# Patient Record
Sex: Female | Born: 1975
Health system: Southern US, Community
[De-identification: ages and names within clinical notes are randomized; demographics above are authoritative.]

## PROBLEM LIST (undated history)

## (undated) DIAGNOSIS — E785 Hyperlipidemia, unspecified: Secondary | ICD-10-CM

## (undated) DIAGNOSIS — M810 Age-related osteoporosis without current pathological fracture: Secondary | ICD-10-CM

## (undated) DIAGNOSIS — T7840XA Allergy, unspecified, initial encounter: Secondary | ICD-10-CM

## (undated) DIAGNOSIS — M3320 Polymyositis, organ involvement unspecified: Secondary | ICD-10-CM

## (undated) DIAGNOSIS — I10 Essential (primary) hypertension: Secondary | ICD-10-CM

## (undated) DIAGNOSIS — G2581 Restless legs syndrome: Secondary | ICD-10-CM

## (undated) DIAGNOSIS — E559 Vitamin D deficiency, unspecified: Secondary | ICD-10-CM

## (undated) DIAGNOSIS — F419 Anxiety disorder, unspecified: Secondary | ICD-10-CM

## (undated) HISTORY — DX: Polymyositis, organ involvement unspecified: M33.20

## (undated) HISTORY — DX: Anxiety disorder, unspecified: F41.9

## (undated) HISTORY — DX: Hyperlipidemia, unspecified: E78.5

## (undated) HISTORY — DX: Essential (primary) hypertension: I10

## (undated) HISTORY — DX: Allergy, unspecified, initial encounter: T78.40XA

## (undated) HISTORY — DX: Restless legs syndrome: G25.81

## (undated) HISTORY — PX: BRAIN SURGERY: SHX531

## (undated) HISTORY — DX: Age-related osteoporosis without current pathological fracture: M81.0

## (undated) HISTORY — DX: Vitamin D deficiency, unspecified: E55.9

---

## 1998-02-19 ENCOUNTER — Emergency Department (HOSPITAL_COMMUNITY): Admission: EM | Admit: 1998-02-19 | Discharge: 1998-02-19 | Payer: Self-pay | Admitting: *Deleted

## 1999-11-14 ENCOUNTER — Encounter: Payer: Self-pay | Admitting: Emergency Medicine

## 1999-11-14 ENCOUNTER — Emergency Department (HOSPITAL_COMMUNITY): Admission: EM | Admit: 1999-11-14 | Discharge: 1999-11-14 | Payer: Self-pay | Admitting: Emergency Medicine

## 1999-11-22 ENCOUNTER — Emergency Department (HOSPITAL_COMMUNITY): Admission: EM | Admit: 1999-11-22 | Discharge: 1999-11-22 | Payer: Self-pay | Admitting: Emergency Medicine

## 1999-12-01 ENCOUNTER — Emergency Department (HOSPITAL_COMMUNITY): Admission: EM | Admit: 1999-12-01 | Discharge: 1999-12-01 | Payer: Self-pay | Admitting: Emergency Medicine

## 1999-12-11 ENCOUNTER — Emergency Department (HOSPITAL_COMMUNITY): Admission: EM | Admit: 1999-12-11 | Discharge: 1999-12-12 | Payer: Self-pay | Admitting: *Deleted

## 1999-12-12 ENCOUNTER — Encounter: Payer: Self-pay | Admitting: Emergency Medicine

## 2005-04-10 ENCOUNTER — Emergency Department (HOSPITAL_COMMUNITY): Admission: EM | Admit: 2005-04-10 | Discharge: 2005-04-11 | Payer: Self-pay | Admitting: Emergency Medicine

## 2005-11-01 ENCOUNTER — Emergency Department (HOSPITAL_COMMUNITY): Admission: EM | Admit: 2005-11-01 | Discharge: 2005-11-01 | Payer: Self-pay | Admitting: Emergency Medicine

## 2006-02-07 ENCOUNTER — Emergency Department (HOSPITAL_COMMUNITY): Admission: EM | Admit: 2006-02-07 | Discharge: 2006-02-07 | Payer: Self-pay | Admitting: Emergency Medicine

## 2006-10-17 ENCOUNTER — Emergency Department (HOSPITAL_COMMUNITY): Admission: EM | Admit: 2006-10-17 | Discharge: 2006-10-17 | Payer: Self-pay | Admitting: Emergency Medicine

## 2007-08-29 DIAGNOSIS — M3320 Polymyositis, organ involvement unspecified: Secondary | ICD-10-CM

## 2007-08-29 HISTORY — DX: Polymyositis, organ involvement unspecified: M33.20

## 2007-10-27 ENCOUNTER — Emergency Department (HOSPITAL_COMMUNITY): Admission: EM | Admit: 2007-10-27 | Discharge: 2007-10-27 | Payer: Self-pay | Admitting: Emergency Medicine

## 2007-11-11 ENCOUNTER — Ambulatory Visit: Payer: Self-pay | Admitting: Nurse Practitioner

## 2007-11-12 ENCOUNTER — Ambulatory Visit (HOSPITAL_COMMUNITY): Admission: RE | Admit: 2007-11-12 | Discharge: 2007-11-12 | Payer: Self-pay | Admitting: Family Medicine

## 2007-11-13 ENCOUNTER — Telehealth (INDEPENDENT_AMBULATORY_CARE_PROVIDER_SITE_OTHER): Payer: Self-pay | Admitting: Nurse Practitioner

## 2007-11-18 ENCOUNTER — Ambulatory Visit: Payer: Self-pay | Admitting: Nurse Practitioner

## 2007-11-18 ENCOUNTER — Emergency Department (HOSPITAL_COMMUNITY): Admission: EM | Admit: 2007-11-18 | Discharge: 2007-11-18 | Payer: Self-pay | Admitting: Emergency Medicine

## 2007-11-18 DIAGNOSIS — R531 Weakness: Secondary | ICD-10-CM

## 2007-11-20 ENCOUNTER — Inpatient Hospital Stay (HOSPITAL_COMMUNITY): Admission: RE | Admit: 2007-11-20 | Discharge: 2007-11-26 | Payer: Self-pay | Admitting: Neurosurgery

## 2007-11-20 ENCOUNTER — Encounter (INDEPENDENT_AMBULATORY_CARE_PROVIDER_SITE_OTHER): Payer: Self-pay | Admitting: Nurse Practitioner

## 2007-11-20 DIAGNOSIS — G935 Compression of brain: Secondary | ICD-10-CM

## 2007-12-10 ENCOUNTER — Encounter: Admission: RE | Admit: 2007-12-10 | Discharge: 2007-12-10 | Payer: Self-pay | Admitting: Neurosurgery

## 2007-12-26 ENCOUNTER — Encounter: Admission: RE | Admit: 2007-12-26 | Discharge: 2008-01-07 | Payer: Self-pay | Admitting: Neurosurgery

## 2008-01-02 ENCOUNTER — Encounter (INDEPENDENT_AMBULATORY_CARE_PROVIDER_SITE_OTHER): Payer: Self-pay | Admitting: Nurse Practitioner

## 2008-01-02 ENCOUNTER — Inpatient Hospital Stay (HOSPITAL_COMMUNITY): Admission: AD | Admit: 2008-01-02 | Discharge: 2008-01-08 | Payer: Self-pay | Admitting: Neurosurgery

## 2008-01-03 ENCOUNTER — Encounter (INDEPENDENT_AMBULATORY_CARE_PROVIDER_SITE_OTHER): Payer: Self-pay | Admitting: Neurosurgery

## 2008-01-06 ENCOUNTER — Ambulatory Visit: Payer: Self-pay | Admitting: Physical Medicine & Rehabilitation

## 2008-01-08 ENCOUNTER — Inpatient Hospital Stay (HOSPITAL_COMMUNITY)
Admission: RE | Admit: 2008-01-08 | Discharge: 2008-02-05 | Payer: Self-pay | Admitting: Physical Medicine & Rehabilitation

## 2008-01-09 LAB — CONVERTED CEMR LAB
ALT: 119 units/L
AST: 191 units/L
Albumin: 2.8 g/dL
Alkaline Phosphatase: 47 units/L
BUN: 10 mg/dL
Basophils Absolute: 0.1 10*3/uL
Basophils Relative: 1 %
CO2: 23 meq/L
Calcium: 9.1 mg/dL
Chloride: 106 meq/L
Creatinine, Ser: 0.31 mg/dL
Eosinophils Absolute: 0 10*3/uL
Eosinophils Relative: 0 %
Glucose, Bld: 80 mg/dL
HCT: 38.4 %
Hemoglobin: 13.2 g/dL
Hgb A1c MFr Bld: 5.3 %
Lymphocytes Relative: 28 %
Lymphs Abs: 3.4 10*3/uL
MCHC: 34.4 g/dL
MCV: 93.3 fL
Monocytes Absolute: 0.8 10*3/uL
Monocytes Relative: 7 %
Neutro Abs: 8 10*3/uL
Neutrophils Relative %: 65 %
Platelets: 444 10*3/uL
Potassium: 3.8 meq/L
RBC: 4.11 M/uL
RDW: 16.4 %
Sodium: 141 meq/L
Total Bilirubin: 0.4 mg/dL
Total Protein: 6.1 g/dL
WBC: 12.3 10*3/uL

## 2008-01-14 ENCOUNTER — Encounter (INDEPENDENT_AMBULATORY_CARE_PROVIDER_SITE_OTHER): Payer: Self-pay | Admitting: Nurse Practitioner

## 2008-02-05 ENCOUNTER — Ambulatory Visit: Payer: Self-pay | Admitting: Physical Medicine & Rehabilitation

## 2008-02-05 ENCOUNTER — Telehealth (INDEPENDENT_AMBULATORY_CARE_PROVIDER_SITE_OTHER): Payer: Self-pay | Admitting: Nurse Practitioner

## 2008-02-06 ENCOUNTER — Encounter (INDEPENDENT_AMBULATORY_CARE_PROVIDER_SITE_OTHER): Payer: Self-pay | Admitting: Nurse Practitioner

## 2008-02-10 ENCOUNTER — Encounter (HOSPITAL_COMMUNITY): Admission: RE | Admit: 2008-02-10 | Discharge: 2008-05-10 | Payer: Self-pay | Admitting: Neurology

## 2008-02-19 ENCOUNTER — Ambulatory Visit: Payer: Self-pay | Admitting: Nurse Practitioner

## 2008-03-02 ENCOUNTER — Encounter
Admission: RE | Admit: 2008-03-02 | Discharge: 2008-04-28 | Payer: Self-pay | Admitting: Physical Medicine & Rehabilitation

## 2008-03-03 ENCOUNTER — Ambulatory Visit: Payer: Self-pay | Admitting: Physical Medicine & Rehabilitation

## 2008-04-07 ENCOUNTER — Inpatient Hospital Stay (HOSPITAL_COMMUNITY): Admission: AD | Admit: 2008-04-07 | Discharge: 2008-04-16 | Payer: Self-pay | Admitting: Neurology

## 2008-04-20 ENCOUNTER — Encounter (INDEPENDENT_AMBULATORY_CARE_PROVIDER_SITE_OTHER): Payer: Self-pay | Admitting: Nurse Practitioner

## 2008-04-28 ENCOUNTER — Ambulatory Visit: Payer: Self-pay | Admitting: Physical Medicine & Rehabilitation

## 2008-05-11 ENCOUNTER — Encounter (HOSPITAL_COMMUNITY): Admission: RE | Admit: 2008-05-11 | Discharge: 2008-08-09 | Payer: Self-pay | Admitting: Neurology

## 2008-05-28 ENCOUNTER — Encounter (INDEPENDENT_AMBULATORY_CARE_PROVIDER_SITE_OTHER): Payer: Self-pay | Admitting: Nurse Practitioner

## 2008-06-15 ENCOUNTER — Encounter (INDEPENDENT_AMBULATORY_CARE_PROVIDER_SITE_OTHER): Payer: Self-pay | Admitting: Nurse Practitioner

## 2008-06-16 ENCOUNTER — Ambulatory Visit: Payer: Self-pay | Admitting: Nurse Practitioner

## 2008-06-16 DIAGNOSIS — M3320 Polymyositis, organ involvement unspecified: Secondary | ICD-10-CM | POA: Insufficient documentation

## 2008-06-16 DIAGNOSIS — I10 Essential (primary) hypertension: Secondary | ICD-10-CM | POA: Insufficient documentation

## 2008-06-16 HISTORY — DX: Essential (primary) hypertension: I10

## 2008-06-18 DIAGNOSIS — N200 Calculus of kidney: Secondary | ICD-10-CM | POA: Insufficient documentation

## 2008-06-22 ENCOUNTER — Ambulatory Visit: Payer: Self-pay | Admitting: *Deleted

## 2008-07-28 ENCOUNTER — Encounter: Admission: RE | Admit: 2008-07-28 | Discharge: 2008-08-27 | Payer: Self-pay | Admitting: Neurology

## 2008-09-07 ENCOUNTER — Encounter (INDEPENDENT_AMBULATORY_CARE_PROVIDER_SITE_OTHER): Payer: Self-pay | Admitting: Nurse Practitioner

## 2008-09-09 ENCOUNTER — Encounter: Admission: RE | Admit: 2008-09-09 | Discharge: 2008-11-20 | Payer: Self-pay | Admitting: Neurology

## 2008-11-03 ENCOUNTER — Encounter (INDEPENDENT_AMBULATORY_CARE_PROVIDER_SITE_OTHER): Payer: Self-pay | Admitting: Nurse Practitioner

## 2008-12-03 ENCOUNTER — Encounter (INDEPENDENT_AMBULATORY_CARE_PROVIDER_SITE_OTHER): Payer: Self-pay | Admitting: Nurse Practitioner

## 2008-12-18 ENCOUNTER — Encounter (INDEPENDENT_AMBULATORY_CARE_PROVIDER_SITE_OTHER): Payer: Self-pay | Admitting: Nurse Practitioner

## 2008-12-24 ENCOUNTER — Encounter (INDEPENDENT_AMBULATORY_CARE_PROVIDER_SITE_OTHER): Payer: Self-pay | Admitting: Nurse Practitioner

## 2009-01-06 ENCOUNTER — Encounter (INDEPENDENT_AMBULATORY_CARE_PROVIDER_SITE_OTHER): Payer: Self-pay | Admitting: Nurse Practitioner

## 2009-01-12 LAB — CONVERTED CEMR LAB

## 2009-01-13 ENCOUNTER — Ambulatory Visit: Payer: Self-pay | Admitting: Nurse Practitioner

## 2009-01-13 LAB — CONVERTED CEMR LAB: Blood Glucose, Fingerstick: 64

## 2009-01-27 ENCOUNTER — Ambulatory Visit: Payer: Self-pay | Admitting: Nurse Practitioner

## 2009-01-28 ENCOUNTER — Encounter: Admission: RE | Admit: 2009-01-28 | Discharge: 2009-03-03 | Payer: Self-pay | Admitting: Clinical Neurophysiology

## 2009-01-28 ENCOUNTER — Encounter (INDEPENDENT_AMBULATORY_CARE_PROVIDER_SITE_OTHER): Payer: Self-pay | Admitting: Nurse Practitioner

## 2009-01-28 DIAGNOSIS — E7800 Pure hypercholesterolemia, unspecified: Secondary | ICD-10-CM | POA: Insufficient documentation

## 2009-01-28 LAB — CONVERTED CEMR LAB
Cholesterol: 268 mg/dL — ABNORMAL HIGH (ref 0–200)
HDL: 48 mg/dL (ref >39)
LDL Cholesterol: 185 mg/dL — ABNORMAL HIGH (ref 0–99)
Total CHOL/HDL Ratio: 5.6
Triglycerides: 175 mg/dL — ABNORMAL HIGH (ref <150)
VLDL: 35 mg/dL (ref 0–40)

## 2009-02-01 ENCOUNTER — Telehealth (INDEPENDENT_AMBULATORY_CARE_PROVIDER_SITE_OTHER): Payer: Self-pay | Admitting: Nurse Practitioner

## 2009-02-01 ENCOUNTER — Encounter
Admission: RE | Admit: 2009-02-01 | Discharge: 2009-02-11 | Payer: Self-pay | Admitting: Physical Medicine & Rehabilitation

## 2009-02-19 ENCOUNTER — Ambulatory Visit: Payer: Self-pay | Admitting: Nurse Practitioner

## 2009-02-22 ENCOUNTER — Encounter (INDEPENDENT_AMBULATORY_CARE_PROVIDER_SITE_OTHER): Payer: Self-pay | Admitting: Nurse Practitioner

## 2009-02-24 ENCOUNTER — Encounter (INDEPENDENT_AMBULATORY_CARE_PROVIDER_SITE_OTHER): Payer: Self-pay | Admitting: Nurse Practitioner

## 2009-02-25 ENCOUNTER — Telehealth (INDEPENDENT_AMBULATORY_CARE_PROVIDER_SITE_OTHER): Payer: Self-pay | Admitting: Nurse Practitioner

## 2009-03-04 ENCOUNTER — Ambulatory Visit: Payer: Self-pay | Admitting: Nurse Practitioner

## 2009-03-05 LAB — CONVERTED CEMR LAB
ALT: 45 units/L — ABNORMAL HIGH (ref 0–35)
AST: 31 units/L (ref 0–37)
Albumin: 4.2 g/dL (ref 3.5–5.2)
Alkaline Phosphatase: 63 units/L (ref 39–117)
BUN: 14 mg/dL (ref 6–23)
Basophils Absolute: 0 10*3/uL (ref 0.0–0.1)
Basophils Relative: 0 % (ref 0–1)
CO2: 21 meq/L (ref 19–32)
Calcium: 9.5 mg/dL (ref 8.4–10.5)
Chloride: 108 meq/L (ref 96–112)
Creatinine, Ser: 0.65 mg/dL (ref 0.40–1.20)
Eosinophils Absolute: 0 10*3/uL (ref 0.0–0.7)
Eosinophils Relative: 0 % (ref 0–5)
Glucose, Bld: 86 mg/dL (ref 70–99)
HCT: 42.3 % (ref 36.0–46.0)
Hemoglobin: 14.3 g/dL (ref 12.0–15.0)
Lymphocytes Relative: 13 % (ref 12–46)
Lymphs Abs: 2 10*3/uL (ref 0.7–4.0)
MCHC: 33.8 g/dL (ref 30.0–36.0)
MCV: 98.4 fL (ref 78.0–100.0)
Monocytes Absolute: 0.7 10*3/uL (ref 0.1–1.0)
Monocytes Relative: 5 % (ref 3–12)
Neutro Abs: 12.7 10*3/uL — ABNORMAL HIGH (ref 1.7–7.7)
Neutrophils Relative %: 82 % — ABNORMAL HIGH (ref 43–77)
Platelets: 356 10*3/uL (ref 150–400)
Potassium: 3.9 meq/L (ref 3.5–5.3)
RBC: 4.3 M/uL (ref 3.87–5.11)
RDW: 17.4 % — ABNORMAL HIGH (ref 11.5–15.5)
Sodium: 143 meq/L (ref 135–145)
Total Bilirubin: 0.2 mg/dL — ABNORMAL LOW (ref 0.3–1.2)
Total CK: 758 units/L — ABNORMAL HIGH (ref 7–177)
Total Protein: 6.6 g/dL (ref 6.0–8.3)
WBC: 15.5 10*3/uL — ABNORMAL HIGH (ref 4.0–10.5)

## 2009-03-30 ENCOUNTER — Encounter (INDEPENDENT_AMBULATORY_CARE_PROVIDER_SITE_OTHER): Payer: Self-pay | Admitting: Nurse Practitioner

## 2009-04-14 ENCOUNTER — Ambulatory Visit: Payer: Self-pay | Admitting: Nurse Practitioner

## 2009-06-03 ENCOUNTER — Ambulatory Visit: Payer: Self-pay | Admitting: Nurse Practitioner

## 2009-06-17 ENCOUNTER — Ambulatory Visit: Payer: Self-pay | Admitting: Nurse Practitioner

## 2009-06-18 LAB — CONVERTED CEMR LAB
ALT: 57 units/L — ABNORMAL HIGH (ref 0–35)
AST: 50 units/L — ABNORMAL HIGH (ref 0–37)
Albumin: 3.8 g/dL (ref 3.5–5.2)
Alkaline Phosphatase: 77 units/L (ref 39–117)
BUN: 7 mg/dL (ref 6–23)
Basophils Absolute: 0 10*3/uL (ref 0.0–0.1)
Basophils Relative: 0 % (ref 0–1)
CO2: 21 meq/L (ref 19–32)
Calcium: 9.1 mg/dL (ref 8.4–10.5)
Chloride: 109 meq/L (ref 96–112)
Creatinine, Ser: 0.48 mg/dL (ref 0.40–1.20)
Eosinophils Absolute: 0.1 10*3/uL (ref 0.0–0.7)
Eosinophils Relative: 1 % (ref 0–5)
Glucose, Bld: 85 mg/dL (ref 70–99)
HCT: 38.4 % (ref 36.0–46.0)
Hemoglobin: 13 g/dL (ref 12.0–15.0)
Lymphocytes Relative: 26 % (ref 12–46)
Lymphs Abs: 2.3 10*3/uL (ref 0.7–4.0)
MCHC: 33.9 g/dL (ref 30.0–36.0)
MCV: 97.2 fL (ref 78.0–100.0)
Monocytes Absolute: 0.3 10*3/uL (ref 0.1–1.0)
Monocytes Relative: 4 % (ref 3–12)
Neutro Abs: 6.3 10*3/uL (ref 1.7–7.7)
Neutrophils Relative %: 70 % (ref 43–77)
Platelets: 431 10*3/uL — ABNORMAL HIGH (ref 150–400)
Potassium: 4.1 meq/L (ref 3.5–5.3)
RBC: 3.95 M/uL (ref 3.87–5.11)
RDW: 15 % (ref 11.5–15.5)
Sodium: 142 meq/L (ref 135–145)
Total Bilirubin: 0.3 mg/dL (ref 0.3–1.2)
Total CK: 281 units/L — ABNORMAL HIGH (ref 7–177)
Total Protein: 7.1 g/dL (ref 6.0–8.3)
WBC: 9.1 10*3/uL (ref 4.0–10.5)

## 2009-07-29 ENCOUNTER — Ambulatory Visit: Payer: Self-pay | Admitting: Nurse Practitioner

## 2009-07-30 LAB — CONVERTED CEMR LAB
ALT: 46 units/L — ABNORMAL HIGH (ref 0–35)
Basophils Relative: 1 % (ref 0–1)
CO2: 18 meq/L — ABNORMAL LOW (ref 19–32)
Chloride: 106 meq/L (ref 96–112)
Lymphs Abs: 1.2 10*3/uL (ref 0.7–4.0)
Monocytes Relative: 8 % (ref 3–12)
Neutro Abs: 2.9 10*3/uL (ref 1.7–7.7)
Neutrophils Relative %: 65 % (ref 43–77)
RBC: 4.03 M/uL (ref 3.87–5.11)
Sodium: 139 meq/L (ref 135–145)
Total Bilirubin: 0.2 mg/dL — ABNORMAL LOW (ref 0.3–1.2)
Total CK: 672 units/L — ABNORMAL HIGH (ref 7–177)
Total Protein: 8.9 g/dL — ABNORMAL HIGH (ref 6.0–8.3)
WBC: 4.4 10*3/uL (ref 4.0–10.5)

## 2009-08-26 ENCOUNTER — Encounter (INDEPENDENT_AMBULATORY_CARE_PROVIDER_SITE_OTHER): Payer: Self-pay | Admitting: Nurse Practitioner

## 2009-09-29 ENCOUNTER — Telehealth (INDEPENDENT_AMBULATORY_CARE_PROVIDER_SITE_OTHER): Payer: Self-pay | Admitting: Nurse Practitioner

## 2009-10-10 ENCOUNTER — Telehealth (INDEPENDENT_AMBULATORY_CARE_PROVIDER_SITE_OTHER): Payer: Self-pay | Admitting: Nurse Practitioner

## 2009-11-01 ENCOUNTER — Encounter (INDEPENDENT_AMBULATORY_CARE_PROVIDER_SITE_OTHER): Payer: Self-pay | Admitting: Nurse Practitioner

## 2009-11-24 ENCOUNTER — Telehealth (INDEPENDENT_AMBULATORY_CARE_PROVIDER_SITE_OTHER): Payer: Self-pay | Admitting: Nurse Practitioner

## 2009-12-02 ENCOUNTER — Encounter (INDEPENDENT_AMBULATORY_CARE_PROVIDER_SITE_OTHER): Payer: Self-pay | Admitting: Nurse Practitioner

## 2009-12-03 ENCOUNTER — Encounter (INDEPENDENT_AMBULATORY_CARE_PROVIDER_SITE_OTHER): Payer: Self-pay | Admitting: Nurse Practitioner

## 2009-12-09 ENCOUNTER — Encounter: Admission: RE | Admit: 2009-12-09 | Discharge: 2010-02-02 | Payer: Self-pay | Admitting: Clinical Neurophysiology

## 2009-12-31 ENCOUNTER — Encounter (INDEPENDENT_AMBULATORY_CARE_PROVIDER_SITE_OTHER): Payer: Self-pay | Admitting: Nurse Practitioner

## 2010-04-14 ENCOUNTER — Encounter (INDEPENDENT_AMBULATORY_CARE_PROVIDER_SITE_OTHER): Payer: Self-pay | Admitting: Nurse Practitioner

## 2010-04-25 ENCOUNTER — Encounter (INDEPENDENT_AMBULATORY_CARE_PROVIDER_SITE_OTHER): Payer: Self-pay | Admitting: Nurse Practitioner

## 2010-04-27 ENCOUNTER — Encounter (INDEPENDENT_AMBULATORY_CARE_PROVIDER_SITE_OTHER): Payer: Self-pay | Admitting: Nurse Practitioner

## 2010-05-19 ENCOUNTER — Encounter (INDEPENDENT_AMBULATORY_CARE_PROVIDER_SITE_OTHER): Payer: Self-pay | Admitting: Nurse Practitioner

## 2010-05-19 ENCOUNTER — Ambulatory Visit: Payer: Self-pay | Admitting: Internal Medicine

## 2010-05-24 ENCOUNTER — Telehealth (INDEPENDENT_AMBULATORY_CARE_PROVIDER_SITE_OTHER): Payer: Self-pay | Admitting: Nurse Practitioner

## 2010-05-24 LAB — CONVERTED CEMR LAB
ALT: 24 units/L (ref 0–35)
BUN: 9 mg/dL (ref 6–23)
CO2: 24 meq/L (ref 19–32)
Cholesterol: 220 mg/dL — ABNORMAL HIGH (ref 0–200)
Creatinine, Ser: 0.51 mg/dL (ref 0.40–1.20)
Glucose, Bld: 76 mg/dL (ref 70–99)
HDL: 43 mg/dL (ref >39)
Total Bilirubin: 0.2 mg/dL — ABNORMAL LOW (ref 0.3–1.2)
Total CHOL/HDL Ratio: 5.1
Triglycerides: 121 mg/dL (ref <150)
VLDL: 24 mg/dL (ref 0–40)

## 2010-05-25 ENCOUNTER — Telehealth (INDEPENDENT_AMBULATORY_CARE_PROVIDER_SITE_OTHER): Payer: Self-pay | Admitting: Nurse Practitioner

## 2010-06-13 ENCOUNTER — Telehealth (INDEPENDENT_AMBULATORY_CARE_PROVIDER_SITE_OTHER): Payer: Self-pay | Admitting: Nurse Practitioner

## 2010-06-27 ENCOUNTER — Encounter (INDEPENDENT_AMBULATORY_CARE_PROVIDER_SITE_OTHER): Payer: Self-pay | Admitting: Nurse Practitioner

## 2010-06-27 LAB — CONVERTED CEMR LAB
ALT: 22 units/L
AST: 36 units/L
Alkaline Phosphatase: 83 units/L
BUN: 9 mg/dL
Creatinine, Ser: 0.6 mg/dL
Glucose, Bld: 87 mg/dL

## 2010-06-29 ENCOUNTER — Encounter (INDEPENDENT_AMBULATORY_CARE_PROVIDER_SITE_OTHER): Payer: Self-pay | Admitting: Nurse Practitioner

## 2010-07-15 ENCOUNTER — Telehealth (INDEPENDENT_AMBULATORY_CARE_PROVIDER_SITE_OTHER): Payer: Self-pay | Admitting: Nurse Practitioner

## 2010-07-25 ENCOUNTER — Telehealth (INDEPENDENT_AMBULATORY_CARE_PROVIDER_SITE_OTHER): Payer: Self-pay | Admitting: Nurse Practitioner

## 2010-09-01 ENCOUNTER — Encounter (INDEPENDENT_AMBULATORY_CARE_PROVIDER_SITE_OTHER): Payer: Self-pay | Admitting: Nurse Practitioner

## 2010-09-01 LAB — CONVERTED CEMR LAB
AST: 47 units/L
Albumin: 3.3 g/dL
Alkaline Phosphatase: 115 units/L
BUN: 8 mg/dL
Creatinine, Ser: 0.62 mg/dL
Glucose, Bld: 70 mg/dL
Potassium: 3.9 meq/L
Total Bilirubin: 0.3 mg/dL

## 2010-09-04 ENCOUNTER — Emergency Department (HOSPITAL_COMMUNITY)
Admission: EM | Admit: 2010-09-04 | Discharge: 2010-09-04 | Payer: Self-pay | Source: Home / Self Care | Admitting: Emergency Medicine

## 2010-09-09 ENCOUNTER — Encounter (INDEPENDENT_AMBULATORY_CARE_PROVIDER_SITE_OTHER): Payer: Self-pay | Admitting: Nurse Practitioner

## 2010-09-12 LAB — URINALYSIS, ROUTINE W REFLEX MICROSCOPIC
Bilirubin Urine: NEGATIVE
Ketones, ur: NEGATIVE mg/dL
Nitrite: NEGATIVE
Protein, ur: NEGATIVE mg/dL
Specific Gravity, Urine: 1.029 (ref 1.005–1.030)
Urine Glucose, Fasting: NEGATIVE mg/dL
Urobilinogen, UA: 0.2 mg/dL (ref 0.0–1.0)
pH: 6.5 (ref 5.0–8.0)

## 2010-09-12 LAB — URINE MICROSCOPIC-ADD ON

## 2010-09-12 LAB — PREGNANCY, URINE: Preg Test, Ur: NEGATIVE

## 2010-09-13 ENCOUNTER — Encounter (INDEPENDENT_AMBULATORY_CARE_PROVIDER_SITE_OTHER): Payer: Self-pay | Admitting: Nurse Practitioner

## 2010-09-15 ENCOUNTER — Ambulatory Visit: Admit: 2010-09-15 | Payer: Self-pay | Admitting: Nurse Practitioner

## 2010-09-18 ENCOUNTER — Encounter: Payer: Self-pay | Admitting: Family Medicine

## 2010-09-25 LAB — CONVERTED CEMR LAB
ALT: 196 units/L — ABNORMAL HIGH (ref 0–35)
ALT: 41 units/L
AST: 343 units/L — ABNORMAL HIGH (ref 0–37)
Albumin: 4.2 g/dL (ref 3.5–5.2)
Alkaline Phosphatase: 51 units/L (ref 39–117)
BUN: 19 mg/dL (ref 6–23)
BUN: 9 mg/dL
Basophils Absolute: 0 10*3/uL (ref 0.0–0.1)
Basophils Relative: 0 % (ref 0–1)
CO2: 21 meq/L (ref 19–32)
CO2: 24 meq/L
Calcium: 9.6 mg/dL (ref 8.4–10.5)
Chloride: 105 meq/L (ref 96–112)
Creatinine, Ser: 0.45 mg/dL (ref 0.40–1.20)
Creatinine, Ser: 0.5 mg/dL
Eosinophils Absolute: 0.1 10*3/uL (ref 0.0–0.7)
Eosinophils Relative: 1 % (ref 0–5)
Glucose, Bld: 63 mg/dL — ABNORMAL LOW (ref 70–99)
HCT: 47.1 % — ABNORMAL HIGH (ref 36.0–46.0)
Hemoglobin: 15.6 g/dL — ABNORMAL HIGH (ref 12.0–15.0)
Lymphocytes Relative: 33 % (ref 12–46)
Lymphs Abs: 3.2 10*3/uL (ref 0.7–4.0)
MCHC: 33.1 g/dL (ref 30.0–36.0)
MCV: 92.2 fL (ref 78.0–100.0)
Monocytes Absolute: 0.8 10*3/uL (ref 0.1–1.0)
Monocytes Relative: 8 % (ref 3–12)
Neutro Abs: 5.6 10*3/uL (ref 1.7–7.7)
Neutrophils Relative %: 58 % (ref 43–77)
Platelets: 509 10*3/uL — ABNORMAL HIGH (ref 150–400)
Potassium: 5.5 meq/L — ABNORMAL HIGH (ref 3.5–5.3)
RBC: 5.11 M/uL (ref 3.87–5.11)
RDW: 16 % — ABNORMAL HIGH (ref 11.5–15.5)
Sodium: 142 meq/L (ref 135–145)
TSH: 3.439 microintl units/mL (ref 0.350–5.50)
Total Bilirubin: 0.4 mg/dL (ref 0.3–1.2)
Total Bilirubin: 0.5 mg/dL
Total Protein: 8.1 g/dL (ref 6.0–8.3)
WBC: 9.7 10*3/uL (ref 4.0–10.5)

## 2010-09-27 NOTE — Letter (Signed)
Summary: [facility]  [facility]   Imported By: Arta Bruce 12/27/2009 16:53:51  _____________________________________________________________________  External Attachment:    Type:   Image     Comment:   External Document

## 2010-09-27 NOTE — Miscellaneous (Signed)
Summary: Lab results  Clinical Lists Changes  Observations: Added new observation of SGPT (ALT): 41 units/L (04/25/2010 14:48) Added new observation of SGOT (AST): 39 units/L (04/25/2010 14:48) Added new observation of ALK PHOS: 95 units/L (04/25/2010 14:48) Added new observation of BILI TOTAL: 0.5 mg/dL (98/06/9146 82:95) Added new observation of CREATININE: 0.5 mg/dL (62/13/0865 78:46) Added new observation of BUN: 9 mg/dL (96/29/5284 13:24) Added new observation of BG RANDOM: 82 mg/dL (40/05/2724 36:64) Added new observation of CO2 PLSM/SER: 24 meq/L (04/25/2010 14:48) Added new observation of CL SERUM: 109 meq/L (04/25/2010 14:48) Added new observation of K SERUM: 3.9 meq/L (04/25/2010 14:48) Added new observation of NA: 139 meq/L (04/25/2010 14:48)

## 2010-09-27 NOTE — Letter (Signed)
Summary: MAILED REQUESTED RECORDS TO [facility]  MAILED REQUESTED RECORDS TO [facility]   Imported By: Arta Bruce 12/31/2009 10:34:39  _____________________________________________________________________  External Attachment:    Type:   Image     Comment:   External Document

## 2010-09-27 NOTE — Progress Notes (Signed)
Summary: Needs lab visit  Phone Note Outgoing Call   Summary of Call: call pt looks like she had labs done 08/26/2009 at [facility] She should have come to this office on 09/26/2009 for labs (unless she went backe to [facility]) If not she will need CK, CMP, CBC lab visit this week with results faxed to Dr. Janan Ridge [phone] so that he will nkow how to titrate her medications accordingly Initial call taken by: Lehman Prom FNP,  October 10, 2009 11:07 AM  Follow-up for Phone Call        pt states her nurse that comes by to give IV treatment has been drawing her bloodwork and sending it to [facility].  noted and reviewed with Steward Drone. Jesse Fall, fnp Follow-up by: Levon Hedger,  October 11, 2009 9:52 AM

## 2010-09-27 NOTE — Letter (Signed)
Summary: External Correspondence//[facility]  External Correspondence//[facility]   Imported By: Arta Bruce 12/14/2009 10:18:20  _____________________________________________________________________  External Attachment:    Type:   Image     Comment:   External Document

## 2010-09-27 NOTE — Progress Notes (Signed)
Summary: NEED TO SPEAK WITH A NURSE re nasal D/C  Phone Note Call from Patient Call back at [phone]   Summary of Call: NEED TO SPEAK WITH NURSE. NEED TO KNOW WHAT MEDICINE SHE NEEDS TO TAKE  BECAUSE SHE HAS A COLD AND HAVE  BLOOD PRESSURE PROBLEM Initial call taken by: Domenic Polite,  July 15, 2010 12:02 PM  Follow-up for Phone Call        afebrile, nasal D/C lg amt clear. dry cough, HA frontal area, eyes watering, ears stopped up, denies sore throat. Advised to drink plenty of liquids, use netti pot or nasal saline spray. Shower directing water on sinus areas. Take Tylenol for HA may take Robitussin for cough. To call us if develops fever, D/C becomes greenish yellow or feels like she is getting worse.  Follow-up by: Gaylyn Cheers RN,  July 15, 2010 4:00 PM

## 2010-09-27 NOTE — Miscellaneous (Signed)
Summary: Med update  Clinical Lists Changes  Medications: Changed medication from PREDNISONE 20 MG TABS (PREDNISONE) One tablet by mouth daily to PREDNISONE 20 MG TABS (PREDNISONE) 1/2 tablet by mouth daily

## 2010-09-27 NOTE — Letter (Signed)
Summary: COMPREHENSIVE METABO;IC PANEL  COMPREHENSIVE METABO;IC PANEL   Imported By: Arta Bruce 06/30/2010 15:13:32  _____________________________________________________________________  External Attachment:    Type:   Image     Comment:   External Document

## 2010-09-27 NOTE — Progress Notes (Signed)
Summary: Allergies/Requesting Medication  Phone Note Call from Patient Call back at [facility] Phone [phone]   Summary of Call: [facility] pt. [name] says that she is having trouble with her allergies and wants to know if you can call something into [facility] on [address] that her Medicaid will cover. Initial call taken by: Leodis Rains,  November 24, 2009 3:39 PM  Follow-up for Phone Call        forward to N. Daphine Deutscher, fnp  Additional Follow-up for Phone Call Additional follow up Details #1::        loratadine 10mg  by mouth daily ordered and sent electronically to [facility] notify pt Additional Follow-up by: Lehman Prom FNP,  November 24, 2009 4:31 PM    Additional Follow-up for Phone Call Additional follow up Details #2::    pt informed. Follow-up by: Levon Hedger,  November 24, 2009 4:55 PM  New/Updated Medications: LORATADINE 10 MG TABS (LORATADINE) One tablet by mouth daily for allergies Prescriptions: LORATADINE 10 MG TABS (LORATADINE) One tablet by mouth daily for allergies  #30 x 3   Entered and Authorized by:   Lehman Prom FNP   Signed by:   Lehman Prom FNP on 11/24/2009   Method used:   Electronically to        [facility] [phone]* (retail)       [address]       [place], [place]  [address]       Ph: [phone]       Fax: [phone]   RxID:   [id number]

## 2010-09-27 NOTE — Progress Notes (Signed)
Summary: Need Dx codes  Phone Note From Pharmacy   Caller: [facility] Summary of Call: pt insurancee has changed. now has medicare A and B Medicare B will not cover methotrexate sodium/ IVIG please give add"l ICD9 codes fax [phone] attn:pharmacy Preferably 279.06 or 279.2 pharmacy has been using 728 for sometime now and Medicare will not cover this Initial call taken by: Michelle Nasuti,  May 24, 2010 4:24 PM  Follow-up for Phone Call        spoke with the pharmacy.  will update problem list Follow-up by: Lehman Prom FNP,  May 25, 2010 2:59 PM  New Problems: COMMON VARIABLE IMMUNODEFICIENCY (ICD-279.06)   New Problems: COMMON VARIABLE IMMUNODEFICIENCY (ICD-279.06)

## 2010-09-27 NOTE — Progress Notes (Signed)
Summary: NEED TO TALK TO THE NURSE  Phone Note Call from Patient Call back at [phone]   Caller: Patient Summary of Call: PLEASE CALL PATIENT SHE NEED TO TALK TO YOU PERSONAL CALL. Initial call taken by: Domenic Polite,  June 13, 2010 11:59 AM  Follow-up for Phone Call        [name]  June 17, 2010 10:46 AM Left message on machine for pt to return call to the office.  Additional Follow-up for Phone Call Additional follow up Details #1::        Pt. wanted to have her port-a-cath changed to a Hickman. She had already contacted her neurologist who had ordered the port-a-cath. Advised she will need to discuss with him. Additional Follow-up by: Gaylyn Cheers RN,  June 22, 2010 12:46 PM

## 2010-09-27 NOTE — Progress Notes (Signed)
Summary: REFILL ON NEXIUM  Phone Note Call from Patient Call back at Home Phone [phone]   Reason for Call: Refill Medication Summary of Call: [name]  [name] IS CALLING IN FOR A REFILL ON HER NEXIUM. SHE SAYS AFTER THIS MONTH SHE WONT HAVE ANOTHER REFILL. SHE USES [facility] ON [address] Initial call taken by: Leodis Rains,  September 29, 2009 1:00 PM  Follow-up for Phone Call        forward to N. Martin,FnP Follow-up by: Levon Hedger,  September 30, 2009 10:29 AM  Additional Follow-up for Phone Call Additional follow up Details #1::        [facility] notify pt to check with pharmacy for availability Additional Follow-up by: Lehman Prom FNP,  September 30, 2009 10:57 AM    Additional Follow-up for Phone Call Additional follow up Details #2::    pt informed. Follow-up by: Levon Hedger,  September 30, 2009 12:58 PM  Prescriptions: NEXIUM 40 MG CPDR (ESOMEPRAZOLE MAGNESIUM) One tablet by mouth daily for stomach  #30 x 5   Entered and Authorized by:   Lehman Prom FNP   Signed by:   Lehman Prom FNP on 09/30/2009   Method used:   Electronically to        [facility] [phone]* (retail)       [address]       [place], [place]  [address]       Ph: [phone]       Fax: [phone]   RxID:   [id number]

## 2010-09-27 NOTE — Letter (Signed)
Summary: [facility]  [facility]   Imported By: Arta Bruce 04/18/2010 09:08:28  _____________________________________________________________________  External Attachment:    Type:   Image     Comment:   External Document

## 2010-09-27 NOTE — Progress Notes (Signed)
Summary: Needs different antibiotic  Phone Note Call from Patient   Summary of Call: Was given Keflex at her last appointment -- states it makes her stomach upset.  Would like something else called in to [facility] on [address]  Initial call taken by: Dutch Quint RN,  May 25, 2010 9:23 AM  Follow-up for Phone Call        is she eating before taking this medication? need to eat cracker, bread, etc  not dairy products or fatty junk foods with the medication. keflex is the best for skin infections so would like her to try to take as ordered Follow-up by: Lehman Prom FNP,  May 25, 2010 9:31 AM  Additional Follow-up for Phone Call Additional follow up Details #1::        Left message on voicemail for pt. to return call.  Dutch Quint RN  May 25, 2010 9:40 AM  Says she was eating before, but she will give it another try.  Dutch Quint RN  May 25, 2010 9:52 AM     Additional Follow-up for Phone Call Additional follow up Details #2::    Please given another try. another option would be to take oral suspension of the same medication but really don't want to change due to immune system n.martin,fnp  May 25, 2010 10:49 AM  Levon Hedger  May 30, 2010 9:42 AM spoke with pt she says that she is not taking it 3 times a day but she is taking it twice a day and tolerating it.

## 2010-09-27 NOTE — Progress Notes (Signed)
Summary: MEDS REFILL Nexium  Phone Note Refill Request Call back at [phone]   Refills Requested: Medication #1:  NEXIUM 40 MG CPDR One tablet by mouth daily for stomach PHARMACY [facility] [address]   Initial call taken by: Domenic Polite,  July 25, 2010 10:17 AM  Follow-up for Phone Call        Left message on answering machine for pt. to return call.  Still has refills available at pharmacy.  Dutch Quint RN  July 25, 2010 5:20 PM  Pt. notified -- got refill yesterday.  Dutch Quint RN  July 27, 2010 9:19 AM

## 2010-09-27 NOTE — Miscellaneous (Signed)
Summary: Labs  Clinical Lists Changes  Observations: Added new observation of CALCIUM: 8.3 mg/dL (16/05/9603 54:09) Added new observation of SGPT (ALT): 22 units/L (06/27/2010 18:16) Added new observation of SGOT (AST): 36 units/L (06/27/2010 18:16) Added new observation of ALK PHOS: 83 units/L (06/27/2010 18:16) Added new observation of CREATININE: 0.6 mg/dL (81/19/1478 29:56) Added new observation of BUN: 9 mg/dL (21/30/8657 84:69) Added new observation of BG RANDOM: 87 mg/dL (62/95/2841 32:44) Added new observation of CO2 PLSM/SER: 22 meq/L (06/27/2010 18:16) Added new observation of CL SERUM: 106 meq/L (06/27/2010 18:16) Added new observation of K SERUM: 3.4 meq/L (06/27/2010 18:16) Added new observation of NA: 132 meq/L (06/27/2010 18:16)

## 2010-09-27 NOTE — Letter (Signed)
[facility]   Imported By: Arta Bruce 12/06/2009 14:15:25  _____________________________________________________________________  External Attachment:    Type:   Image     Comment:   External Document

## 2010-09-27 NOTE — Assessment & Plan Note (Signed)
Summary: HTN/Hypercholesterolemia   Vital Signs:  Patient profile:   35 year old female LMP:     04/30/2010 Weight:      199.3 pounds BMI:     29.54 Temp:     97.2 degrees F oral Pulse rate:   70 / minute Pulse rhythm:   regular Resp:     16 per minute BP sitting:   140 / 84  (left arm) Cuff size:   regular  Vitals Entered By: Levon Hedger (May 19, 2010 11:47 AM)  Nutrition Counseling: Patient's BMI is greater than 25 and therefore counseled on weight management options. CC: need refills on BP medication....tatoo is possibly infected...pt has a hickman and they change to a portacath she state it is very uncomfortable  and hard to take care., Hypertension Management Is Patient Diabetic? No Pain Assessment Patient in pain? no       Does patient need assistance? Functional Status Self care Ambulation Normal LMP (date): 04/30/2010 LMP - Character: normal     Enter LMP: 04/30/2010 Last PAP Result done at [facility] G[facility]   CC:  need refills on BP medication....tatoo is possibly infected...pt has a hickman and they change to a portacath she state it is very uncomfortable  and hard to take care. and Hypertension Management.  History of Present Illness:  Pt into the office for f/u on htn.  Neurology - pt is still going to [facility] for f/u, She is on IVIG.  Pt has a hickman catheter, but reports it is uncomfortable.  She is hoping to be scheduled for a port-a-cath. Pt is not sure how long she will need IVIG. (will need to [facility] reports)  Obesity - down 6 pounds since last visit Pt is up and walking around more than previously.  She presents today in her motorized wheelchair.  Pt admits than when she is at home she is rarely in her chair.  Diabetes Management History:      She says that she is not exercising regularly.    Hypertension History:      She denies headache, chest pain, and palpitations.        Positive major cardiovascular risk factors  include hyperlipidemia and hypertension.  Negative major cardiovascular risk factors include female age less than 79 years old and non-tobacco-user status.        Further assessment for target organ damage reveals no history of ASHD, cardiac end-organ damage (CHF/LVH), stroke/TIA, peripheral vascular disease, renal insufficiency, or hypertensive retinopathy.    Contraindications/Deferment of Procedures/Staging:    Treatment: Flu Shot    Contraindication: other     Allergies: No Known Drug Allergies  Review of Systems CV:  Denies chest pain or discomfort. Resp:  Denies cough. GI:  Denies abdominal pain, nausea, and vomiting. MS:  Complains of muscle weakness; improved though still presents. Derm:  Complains of poor wound healing; left forearm - tattoo done 2 weeks ago.  scab came off 3 days ago.  she has been applying neosporin to the affected area..  Physical Exam  General:  alert.   Head:  normocephalic.   Lungs:  normal breath sounds.   Heart:  normal rate and regular rhythm.   Msk:  motorized wheechair Neurologic:  alert & oriented X3.   Skin:  left forearm tattoo with Luster Landsberg' center of tattoo with yellow, clear drainage Psych:  Oriented X3.     Impression & Recommendations:  Problem # 1:  INFECTION, SKIN AND SOFT TISSUE (ICD-686.9) from tattoo  use advise pt not get any further tattoos due to chronic steroids she is likely to take time to heal  Problem # 2:  HYPERTENSION, BENIGN ESSENTIAL (ICD-401.1) BP slightly elevated but will continue current meds hopefully with pt getting up and moving bp will improve will monitor The following medications were removed from the medication list:    Hydrochlorothiazide 25 Mg Tabs (Hydrochlorothiazide) .Marland Kitchen... Take one (1) by mouth daily as needed Her updated medication list for this problem includes:    Tenormin 50 Mg Tabs (Atenolol) ..... One tablet by mouth two times a day    Lisinopril 10 Mg Tabs (Lisinopril) ..... One tablet by  mouth daily for blood pressure  Problem # 3:  HYPERCHOLESTEROLEMIA (ICD-272.0) will check labs today Her updated medication list for this problem includes:    Zetia 10 Mg Tabs (Ezetimibe) ..... One tablet by mouth daily for cholesterol  Orders: T-Lipid Profile ([address]98-11914) T-Comprehensive Metabolic Panel (78295-62130)  Problem # 4:  POLYMYOSITIS (ICD-710.4) continue f/u by [facility]  Complete Medication List: 1)  Prednisone 20 Mg Tabs (Prednisone) .... 1/2 tablet by mouth daily 2)  Nexium 40 Mg Cpdr (Esomeprazole magnesium) .... One tablet by mouth daily for stomach 3)  Methotrexate Sodium 50 Mg/52ml Soln (Methotrexate sodium) .... Rx by neurology 4)  Colace 100 Mg Caps (Docusate sodium) .Marland Kitchen.. 1 tablet by mouth two times a day for stools 5)  Centrum Tabs (Multiple vitamins-minerals) .Marland Kitchen.. 1 tablet by mouth daily 6)  Tenormin 50 Mg Tabs (Atenolol) .... One tablet by mouth two times a day 7)  Viactiv Multi-vitamin Chew (Multiple vitamins-calcium) .Marland Kitchen.. 1 tablet by mouth two times a day 8)  Zetia 10 Mg Tabs (Ezetimibe) .... One tablet by mouth daily for cholesterol 9)  Lisinopril 10 Mg Tabs (Lisinopril) .... One tablet by mouth daily for blood pressure 10)  Loratadine 10 Mg Tabs (Loratadine) .... One tablet by mouth daily for allergies 11)  Vicodin 5-500 Mg Tabs (Hydrocodone-acetaminophen) .... Rx by neurology 12)  Keflex 500 Mg Caps (Cephalexin) .... One tablet by mouth three times a day for infection  Diabetes Management Assessment/Plan:      Her blood pressure goal is < 140/90.    Hypertension Assessment/Plan:      The patient's hypertensive risk group is category B: At least one risk factor (excluding diabetes) with no target organ damage.  Her calculated 10 year risk of coronary heart disease is 1 %.  Today's blood pressure is 140/84.  Her blood pressure goal is < 140/90.  Patient Instructions: 1)  Your labs will be checked today and you will be notified of the results 2)  Left  forearm - keep clean and may apply antibiotic ointment  3)  take keflex 500mg  by mouth three times a day x 7 days 4)  Blood pressure - 140/84 (slightly elevated) 5)  Continue current medications. Hopefully with you up and moving around the blood pressure will improve. 6)  Follow up as needed Prescriptions: KEFLEX 500 MG CAPS (CEPHALEXIN) One tablet by mouth three times a day for infection  #21 x 0   Entered and Authorized by:   Lehman Prom FNP   Signed by:   Lehman Prom FNP on 05/19/2010   Method used:   Print then Give to Patient   RxID:   [id number] LISINOPRIL 10 MG TABS (LISINOPRIL) One tablet by mouth daily for blood pressure  #30 Each x 5   Entered and Authorized by:   Lehman Prom FNP   Signed  by:   Lehman Prom FNP on 05/19/2010   Method used:   Print then Give to Patient   RxID:   [id number] TENORMIN 50 MG TABS (ATENOLOL) One tablet by mouth two times a day  #60 x 5   Entered and Authorized by:   Lehman Prom FNP   Signed by:   Lehman Prom FNP on 05/19/2010   Method used:   Print then Give to Patient   RxID:   [id number] NEXIUM 40 MG CPDR (ESOMEPRAZOLE MAGNESIUM) One tablet by mouth daily for stomach  #30 x 5   Entered and Authorized by:   Lehman Prom FNP   Signed by:   Lehman Prom FNP on 05/19/2010   Method used:   Print then Give to Patient   RxID:   [id number] KEFLEX 500 MG CAPS (CEPHALEXIN) One tablet by mouth three times a day for infection  #21 x 0   Entered and Authorized by:   Lehman Prom FNP   Signed by:   Lehman Prom FNP on 05/19/2010   Method used:   Print then Give to Patient   RxID:   [id number] LISINOPRIL 10 MG TABS (LISINOPRIL) One tablet by mouth daily for blood pressure  #30 Each x 5   Entered and Authorized by:   Lehman Prom FNP   Signed by:   Lehman Prom FNP on 05/19/2010   Method used:   Print then Give to Patient   RxID:   [id number] TENORMIN 50 MG TABS (ATENOLOL) One  tablet by mouth two times a day  #60 x 5   Entered and Authorized by:   Lehman Prom FNP   Signed by:   Lehman Prom FNP on 05/19/2010   Method used:   Print then Give to Patient   RxID:   [id number] NEXIUM 40 MG CPDR (ESOMEPRAZOLE MAGNESIUM) One tablet by mouth daily for stomach  #30 x 5   Entered and Authorized by:   Lehman Prom FNP   Signed by:   Lehman Prom FNP on 05/19/2010   Method used:   Print then Give to Patient   RxID:   [id number]   Prevention & Chronic Care Immunizations   Influenza vaccine: Not documented    Tetanus booster: Not documented    Pneumococcal vaccine: Not documented  Other Screening   Pap smear: done at [facility]  (01/12/2009)   Smoking status: never  (04/14/2009)  Lipids   Total Cholesterol: 268  (01/27/2009)   LDL: 185  (01/27/2009)   LDL Direct: Not documented   HDL: 48  (01/27/2009)   Triglycerides: 175  (01/27/2009)    SGOT (AST): 39  (04/25/2010)   SGPT (ALT): 41  (04/25/2010) CMP ordered    Alkaline phosphatase: 95  (04/25/2010)   Total bilirubin: 0.5  (04/25/2010)  Hypertension   Last Blood Pressure: 140 / 84  (05/19/2010)   Serum creatinine: 0.5  (04/25/2010)   Serum potassium 3.9  (04/25/2010) CMP ordered   Self-Management Support :    Hypertension self-management support: Not documented    Lipid self-management support: Not documented

## 2010-09-29 NOTE — Medication Information (Signed)
Summary: [facility]  [facility]   Imported By: Arta Bruce 09/13/2010 09:32:27  _____________________________________________________________________  External Attachment:    Type:   Image     Comment:   External Document

## 2010-09-29 NOTE — Miscellaneous (Signed)
Summary: Lab by [facility]  Clinical Lists Changes  Observations: Added new observation of CALCIUM: 8.9 mg/dL (16/05/9603 54:09) Added new observation of ALBUMIN: 3.3 g/dL (81/19/1478 29:56) Added new observation of PROTEIN, TOT: 9.2 g/dL (21/30/8657 84:69) Added new observation of SGPT (ALT): 35 units/L (09/01/2010 14:13) Added new observation of SGOT (AST): 47 units/L (09/01/2010 14:13) Added new observation of ALK PHOS: 115 units/L (09/01/2010 14:13) Added new observation of BILI TOTAL: 0.3 mg/dL (62/95/2841 32:44) Added new observation of CREATININE: 0.62 mg/dL (08/30/7251 66:44) Added new observation of BUN: 8 mg/dL (03/47/4259 56:38) Added new observation of BG RANDOM: 70 mg/dL (75/64/3329 51:88) Added new observation of CO2 PLSM/SER: 23 meq/L (09/01/2010 14:13) Added new observation of CL SERUM: 108 meq/L (09/01/2010 14:13) Added new observation of K SERUM: 3.9 meq/L (09/01/2010 14:13) Added new observation of NA: 138 meq/L (09/01/2010 14:13)

## 2010-09-29 NOTE — Letter (Signed)
Summary: [facility]  [facility]   Imported By: Arta Bruce 09/09/2010 10:08:02  _____________________________________________________________________  External Attachment:    Type:   Image     Comment:   External Document

## 2010-09-29 NOTE — Miscellaneous (Signed)
Summary: Med update  Clinical Lists Changes  Medications: Changed medication from PREDNISONE 20 MG TABS (PREDNISONE) 1/2 tablet by mouth daily to PREDNISONE 5 MG TABS (PREDNISONE) 3 tablets by mouth daily

## 2010-09-29 NOTE — Letter (Signed)
Summary: [employer] [facility]  [employer] [facility]   Imported By: Arta Bruce 09/13/2010 11:28:21  _____________________________________________________________________  External Attachment:    Type:   Image     Comment:   External Document

## 2011-01-10 NOTE — Assessment & Plan Note (Signed)
HISTORY:  [name] is back regarding her dermatomyositis.  She had been  home receiving outpatient therapy.  She received one course of IV IgM,  felt that this is beneficial typically for her thighs.  Therapy is  working on truncal stability and transfers still.  She is slowly  progressing.  She denies pain.  She is working on getting a handicapped-  accessible apartment here in [place], but still living with her  parents in [place].  She remains on prednisone 80 mg a day.  She has  not followed up with Neurology as of yet apparently.   REVIEW OF SYSTEMS:  Notable for some weakness and trouble walking.  Other pertinent positives are above.   FUNCTIONAL HISTORY:  The patient continues to need assistance with  dressing, sometimes bathing, depending on energy levels, clothing, and  transferring issues fatigue.  She also needs help with meal prep,  household activities, and shopping.   SOCIAL HISTORY:  The patient is single, living with her daughter and  parents.   PHYSICAL EXAMINATION:  VITAL SIGNS:  Blood pressure is 146/12, pulse is  80, respiratory rate is 20, and she is sating 100 on room air.  GENERAL:  The patient is pleasant, alert, and oriented x3.  Affect is  bright and appropriate.  She has gained a bit of weight, but not  excessively so.  Strength is similar to her prior exams with 2/5  strength right shoulder and 5/5 strength right hand.  She has 2-  strength in the left shoulder with 5 at the left hand.  She has 2- right  hip, 2+ left hip, 3-4 at the ankles and feet.  Sensory exam is intact.  She has no muscle pain.  She has intact range of motion.  Cognition is  appropriate.  Cranial nerve exam is intact.  HEART:  Regular.  CHEST:  Clear.  ABDOMEN:  Soft and nontender.   ASSESSMENT:  Dermatomyositis with quadriparesis, chiefly in the proximal  limbs and trunk.   PLAN:  1. The patient needs to contact Dr. Thad Ranger' office regarding      followup.  I think she may  benefit from a repeat course of      intravenous  immune globulin, as it appears that she responded to      this more so than steroids.  2. Continue steroids for now and per Dr. Thad Ranger' direction in the      future.  3. Recommended handicapped-accessible house.  I think if she has such      a house, in which she can accommodate a wheelchair that she could      function independently with an aid during the day of 8 hours'      duration.  4. Continue therapies at [facility] with transition here to [facility].  She ultimately would benefit from aquatic rehab      as well.  5. I will see her back in about 2 months' time.  She can come back to      see me sooner if needed.      Ranelle Oyster, M.D.  Electronically Signed     ZTS/MedQ  D:  03/03/2008 11:44:46  T:  03/04/2008 06:11:16  Job #:  [id number]   cc:   Casimiro Needle L. Thad Ranger, M.D.  Fax: [phone]

## 2011-01-10 NOTE — Consult Note (Signed)
[name], [name] NO.:  [id number]   MEDICAL RECORD NO.:  [id number]          PATIENT TYPE:  EMS   LOCATION:  MINO                         FACILITY:  [facility]   PHYSICIAN:  Payton Doughty, M.D.      DATE OF BIRTH:  04-01-1976   DATE OF CONSULTATION:  11/18/2007  DATE OF DISCHARGE:  11/18/2007                                 CONSULTATION   REFERRING PHYSICIAN:  Hilario Quarry, M.D.   HISTORY:  Emergency room doctor who asked me to consult is Dr. Margarita Grizzle.   This 35 year old right-handed black female with headache, stumbling,  shoulder pain and history of falling.  She underwent MRI--Chiari with a  syrinx.   PAST MEDICAL/SURGICAL HISTORY:  Negative.   MEDICATIONS:  None.   ALLERGIES:  NONE.   SOCIAL HISTORY:  Tobacco--negative.  Alcohol--negative.   FAMILY HISTORY:  Noncontributory.   PHYSICAL EXAMINATION:  HEENT:  Within normal limits.  NECK:  Nontender.  She has no deformities.  CHEST:  Clear.  CARDIAC:  Regular rate and rhythm without murmur.  ABDOMEN:  Soft, positive bowel sounds.  GU:  Deferred.  NEUROLOGIC:  She is awake, alert and oriented.  Her pupils are equal,  round and reactive to light.  Extraocular movements are intact.  Facial  movement and sensation intact.  Tongue protrudes in midline.  Shoulder  shrug is normal.  She describes no swallowing difficulties.  There are  no fasciculations at the time.  Motor exam is 5/5 with no pronator  drift.  Romberg is positive.  There is no sensory deficit.  Reflexes are  3 throughout the upper and lower extremities.  One toe is positive  Hoffman's.   DIAGNOSTICS:  MR shows a Chiari malformation and attendance syrinx which  extends from the foramen magnum to T1.   CLINICAL IMPRESSION:  Chiari I malformation with myelopathic  syringomyelia.   PLAN:  She can go home now.  I will make arrangements to operate on her  on Wednesday with a Chiari decompression.     ______________________________  Payton Doughty, M.D.     MWR/MEDQ  D:  11/18/2007  T:  11/19/2007  Job:  [id number]

## 2011-01-10 NOTE — Discharge Summary (Signed)
NAMEBALJIT, LIEBERT               ACCOUNT NO.:  [id number]   MEDICAL RECORD NO.:  [id number]          PATIENT TYPE:  IPS   LOCATION:  4010                         FACILITY:  [facility]   PHYSICIAN:  Faith Rogue, M.D.   DATE OF BIRTH:  04-28-1976   DATE OF ADMISSION:  01/08/2008  DATE OF DISCHARGE:  02/05/2008                               DISCHARGE SUMMARY   DISCHARGE DIAGNOSES:  1. Dermatomyositis with quadriparesis.  2. Left fifth finger cellulitis treated for insomnia.  3. Frequency resolved.  4. Acne flare.   HISTORY OF PRESENT ILLNESS:  Ms. Verbeke is a 35 year old female with  history of hypertension, fall, shoulder pain secondary to Chiari  malformation with syrinx from C2 to T1.  She underwent decompression by  Dr. Channing Mutters on November 20, 2007, with some improvement in shoulder pain but  continued with weakness bilateral lower extremity, greater than  bilateral upper extremity with difficulty walking.  She was admitted for  workup on Jan 02, 2008 with questions of myopathy.  Dr. Thad Ranger was  consulted for input and felt the patient with inflammatory myositis.  Left deltoid muscle biopsy done on Jan 05, 2008, and the patient was  started on steroids.  CK at admission was 10,340 with CRP of 0.6.  Dr.  Phylliss Bob was consulted for input and multiple blood work including check of  ANA, ENA, Hep B, Hep C, HIV, RA, ESI, and acetylcholine binding  antibodies were ordered.  These were all noted to be negative.  The  patient has had improvement in biceps and hamstring strength on  steroids.  Currently, she continues to have impairment in mobility,  requiring safety total assist 50% for standing balance and weight  shifting for approximately 5 minutes.  Requires min assist for upper  body care.  Total assist for lower body care.  Rehab consulted for  further therapies.   PAST MEDICAL HISTORY:  Significant for allergies and decompression of  the syrinx on November 20, 2007.   ALLERGIES:  No known  drug allergies.   FAMILY HISTORY:  Positive for diabetes and CVA.   SOCIAL HISTORY:  The patient is single, lives with a friend in 2-level  home with 1 plus 1 step at entry and 6-7 steps to bedroom which is on  second level.  She was working part-time in [employer] until March  2009 and has not used tobacco in years and uses alcohol occasionally.   HOSPITAL COURSE:  Ms. Lyberti Thrush was admitted to rehab on Jan 08, 2008, for inpatient therapies to consist of PT and OT daily.  Past  admission, she was maintained on prednisone 80 mg p.o. per day  throughout this stay.  Her ABGs were monitored initially at [facility] and  hemoglobin A1c was additionally checked as the patient on high-dose  steroids.  Hemoglobin A1c was normal at 5.3.  The patient's CBGs were  noted to be controlled overall and they ranged from 70s to 90s with an  occasional high in 130s.  The patient's labs checked past admission  revealed hemoglobin 13.2, hematocrit 38.4, white count 12.3, and  platelets 444.  Check of lytes revealed sodium 141, potassium 3.8,  chloride 106, CO2 23, glucose 80, creatinine 0.3, and BUN 10.  LFTs  checked showed some improvement with AST down to 191, ALT 119, T bili  0.4, and albumin 2.8.  A UA checked past admission shows 70,000 colonies  of multiple species.  The patient's voiding was checked with PVR bladder  scan.  She was noted to be voiding without difficulty.  The patient did  report issues with constipation, not having had some BM for few days.  She was started on bowel program with senna-S as well as suppositories,  use p.r.n.  With the patient's improvement in mobility and ability to  get on bedside commode, she is able to evacuate without difficulty.  The  patient's blood pressures have been monitored on b.i.d. basis during  this stay, these have been stable, ranging from 110-120 systolics and  diastolics ranging in 70s overall.  The patient's p.o. intake has been  good.  The  patient reported some issues with adjustment reaction  secondary to her diagnosis and impairments due to her diagnosis.  Dr.  Leonides Cave, neuropsych, has been following the patient along.  His last  evaluation noted the patient in good spirits, and she remains motivated.  Of issue during this stay has been problems with left fifth finger  cellulitis.  She was treated with Keflex  some 7 days for this.  She  also developed acne flare on her forehead and was started on benzoyl  peroxide with improvement in her symptoms.  Ultram was used on p.r.n.  basis for pain management.  She was noted to have some pain in right  lower extremity secondary to contusion while in bed.  A PRAFO was  ordered to be used in bed for support and this has helped with pain  control.  The Neurology has been following the patient intermittently  during this stay.  They were contacted regarding further treatment.  The  patient is to start IVIG past discharge per neurology input.  During his  stay in rehab, Ms. Demarais progressed along well.  She is overall at min  assist for bathing and dressing and toileting  hygiene and shower  transfers to and from padded range.  She is using short sliding board  with drop-arm commode for toileting.  She is able to perform donning and  doffing pants with dressing and toileting using assistive equipment, leg  loops and loop ladder for functional mobility and self care.  She  currently continues to struggle with proximal and truncal weakness with  some bilateral shoulder subluxation being her limiting factor.  Mobility  wise, she has progressed to being at min assist for bed to chair for  ladder transfer with head of bed elevated.  She is able to sit edge of  bed at modified independent.  She requires min-to-mod assist for power  wheelchair bearing setup.  Requires mod assist for car to power  wheelchair transfers and min assist for power wheelchair to bed  transfers.  The patient and her  family have been instructed regarding  self care.  Her transfer issues as well as stretching of upper  extremities as well as lower extremities to help with tightness and help  maintain mobility.  The patient will continue to receive further  followup outpatient PT and OT at [facility] starting February 07, 2008.  Power wheelchair was ordered for her for current use.  On  February 05, 2008, the patient is discharged to home.   DISCHARGE MEDICATIONS:  1. Ambien 10 mg p.o. nightly.  2. Multivitamin 1 p.o. daily.  3. Prednisone 20 mg 4 tablets p.o. daily.  4. Senokot-S 2 p.o. nightly.  5. Protonix 40 mg a day.  6. Ultram 50 mg q.6-8 h. p.r.n. pain.   DIET:  Regular diet.   ACTIVITY:  Level is 24-hour supervision.  No strenuous activity.  No  alcohol.  No smoking.  No driving.   SPECIAL INSTRUCTIONS:  [facility] beginning February 07, 2008  at 9 to 10 and 11 to 12.  IVIG at [facility] Short Stay from Monday to Friday  at 11:00 a.m. starting February 10, 2008 to February 14, 2008 per Dr. Thad Ranger  orders.   FOLLOWUP:  The patient is to follow up with Dr. Riley Kill on March 03, 2008  at 6:40, follow up with Dr. Thad Ranger in next couple of weeks, follow up  with nurse practitioner at [facility] on February 19, 2008 at  3:15.      Greg Cutter, P.A.    ______________________________  Faith Rogue, M.D.    PP/MEDQ  D:  02/12/2008  T:  02/13/2008  Job:  [id number]   cc:   Areatha Keas, M.D.  Dr. Thad Ranger  Dr. Channing Mutters

## 2011-01-10 NOTE — H&P (Signed)
[name], [name] NO.:  [id number]   MEDICAL RECORD NO.:  [id number]          PATIENT TYPE:  IPS   LOCATION:  4002                         FACILITY:  [facility]   PHYSICIAN:  Ranelle Oyster, M.D.DATE OF BIRTH:  [birth year]   DATE OF ADMISSION:  01/08/2008  DATE OF DISCHARGE:                              HISTORY & PHYSICAL   CHIEF COMPLAINT:  Four-extremity weakness.   HISTORY OF PRESENT ILLNESS:  This is a pleasant 35 year old African  American female with history of headache and falls as well as shoulder  pain secondary to the Chiari malformation with syrinx from C2-T1.  The  patient underwent decompression by Dr. Channing Mutters on November 20, 2007, with  improvement in shoulder pain but continued weakness.  The patient was  admitted for workup on Jan 02, 2008, and some question of myopathy was  raised.  The patient was seen by neurology who felt she had an  inflammatory myositis.  Deltoid muscle biopsy was performed and  pathology is pending.  She was started on steroids on Jan 04, 2008.  CK  at admission was 10,340.  C-reactive protein 0.6.  Dr. Okey Dupre consulted  for input with the workup essentially negative for rheumatological  process.  The patient has had some improvement in her strength in the  proximal limbs but with the steroids, but continues to struggle with  mobility and self-care due to a significant proximal muscle weakness.  The patient is requiring mod to max assist for basic movement self-care.  Due to the ongoing medical and functional needs of this patient, she is  admitted to inpatient rehab unit today.   REVIEW OF SYSTEMS:  Notable for intact bowel and bladder movements.  She  denies any sensory loss.  She has had some insomnia which is improved  with Ambien.  Mood has been fair.  She had good appetite.  Denies any  shortness of breath, chest pain, etc.  Full reviews in written H&P.   PAST MEDICAL HISTORY:  Positive for the above as well as seasonal  allergies.   FAMILY HISTORY:  Positive for diabetes and stroke.   SOCIAL HISTORY:  The patient is single, lives with a friend in a two-  level house with one plus one step to enter and has 6 or 7 steps up to  the bathroom.  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  She does not smoke and occasionally drinks.  She has a 59 year old  child.   Functional Status:  The patient was independent prior to surgery.  Currently, she is requiring mod to max assist [place] for basic mobility,  transferring and self-care.   ALLERGIES:  None.   HOME MEDICATIONS:  1. Multivitamin.  2. Allegra.  3. Flonase p.r.n.   LABS:  Hemoglobin 13.1, white count 7.9, and platelets 386.  Sodium 134,  potassium 4.1, BUN and creatinine 6 and 0.3.  AST 263, ALT 115, albumin  2.9, and total protein 6.   PHYSICAL EXAM:  VITAL SIGNS:  Blood pressure 121/71, pulse 87,  respiratory 18, and temperature 97.5.  GENERAL:  The patient is pleasant, in no acute distress.  HEENT:  Pupils equal, round reactive to light.  Ear, nose and throat  exam is unremarkable with fair dentition and pink mucosa.  NECK:  Supple without JVD or lymphadenopathy.  CHEST:  Clear to auscultation bilaterally without wheezes, rales, or  rhonchi.  HEART:  Regular rate and rhythm without murmur, rubs, or gallop.  EXTREMITIES:  No clubbing, cyanosis, or edema.  ABDOMEN:  Soft, nontender.  Bowel sounds are positive.  SKIN:  Notable for left shoulder.  She had left deltoid skin biopsy  which was intact and with sutures.  NEUROLOGIC:  Cranial nerves II through XII are intact with normal  vision.  Speech, tongue movement, facial sensation, etc.  Reflexes are  1+ generally throughout.  Sensation is 2/2 in all 4 limbs today.  Strength is notable for 1-1+/5 movement at the shoulder and biceps 2/5  bilaterally, triceps 2-2+/5, wrist extension and flexion and hand  intrinsics are 4-5/5.  In the lower extremities, she is again 1+/5 to  2/5  proximal at the hips.  Knee extension is 1+/5.  She has 2+/5  hamstrings and 4-5/5 ankle dorsiflexion, plantar flexion today.  Judgment, orientation, memory, mood were all within functional limits.   ASSESSMENT/PLAN:  1. Functional deficit secondary to polymyositis in the setting of      prior Arnold-Chiari malformation.  The patient has postoperative      cervical to T-spine decompression.  The patient with ongoing      functional and medical needs requiring intensive interdisciplinary      inpatient rehab that cannot be provided at lesser level intensive      service.  The patient will be receiving, on average, 3 hours of      rehab a day including PT which will assess and treat the patient      for range of motion, transferring, gait, and appropriate equipment.      OT will assess and treat the patient for range of motion, self-      care, appropriate equipment, splinting, and family education.      Rehab nurse will follow on 24-hour basis for bowel, bladder, skin,      medication safety, and pain needs.  Rehab case manager/social      worker will assess for psychosocial needs and discharge planning.      Estimated length of stay is 2-3 weeks.  Goals, supervision to      modified independent with mobility and self-care.  2. Neuro.  Continue prednisone 80 mg p.o. daily and adjust per Dr.      Thad Ranger' input.  3. Bowels.  We will try suppository tonight for periodic constipation      and get her on scheduled softener and laxative.  4. Sleep.  Use Ambien 10 mg nightly.  5. DVT prophylaxis, subcu Lovenox 40 mg daily.  6. Urinary frequency.  Likely due to IV fluids.  I see no further need      to continue these.  Push p.o. intake and check UA C&S.  7. Allergies.  Continue Allegra and Flonase.      Ranelle Oyster, M.D.  Electronically Signed     ZTS/MEDQ  D:  01/08/2008  T:  01/09/2008  Job:  [id number]

## 2011-01-10 NOTE — Discharge Summary (Signed)
[name], ROEDEL NO.:  [id number]   MEDICAL RECORD NO.:  [id number]          PATIENT TYPE:  INP   LOCATION:  3022                         FACILITY:  [facility]   PHYSICIAN:  Payton Doughty, M.D.      DATE OF BIRTH:  08/13/1976   DATE OF ADMISSION:  01/02/2008  DATE OF DISCHARGE:  01/08/2008                               DISCHARGE SUMMARY   ADMITTING DIAGNOSIS:  Generalized weakness.   DISCHARGE DIAGNOSIS:  Myositis.   PROCEDURE:  Left deltoid biopsy.   This is a 35 year old right-handed black girl whose history and physical  is written in the chart.  She had undergone a Chiari decompression and  done well and increasing complaints of pain and weakness.  On her visit  to the emergency room, she had increasing weakness, difficulty with  upper and lower extremities, having trouble picking her head up if she  bends forward, and difficulty maintaining upright posture.   On exam, she was diffusely weak about 3/5.  MR demonstrated good Chiari  decompression with resolution of the syrinx.  She was admitted and had  neurologist visit with her.  Her muscle enzymes were markedly elevated.  On Jan 03, 2008, she underwent a deltoid biopsy, results which were  consistent with myopathy.  She was also visited by the rheumatology  service and started on steroids.  Physical therapy was initiated.  She  was arranged to be transferred to the rehab service on Jan 08, 2008, and  is under their care as well as under the care of rheumatology and  neurology for her myopathy.  She was discharged to rehab on Jan 08, 2008.     ______________________________  Payton Doughty, M.D.    ______________________________  Payton Doughty, M.D.    MWR/MEDQ  D:  03/03/2008  T:  03/03/2008  Job:  [id number]

## 2011-01-10 NOTE — Op Note (Signed)
NAMERUTHMARY, OCCHIPINTI NO.:  [id number]   MEDICAL RECORD NO.:  [id number]          PATIENT TYPE:  INP   LOCATION:  3103                         FACILITY:  [facility]   PHYSICIAN:  Payton Doughty, M.D.      DATE OF BIRTH:  Aug 02, 1976   DATE OF PROCEDURE:  11/20/2007  DATE OF DISCHARGE:                               OPERATIVE REPORT   PREOPERATIVE DIAGNOSIS:  Chiari type 1 malformation with syringomyelia.   POSTOPERATIVE DIAGNOSIS:  Chiari type 1 malformation with syringomyelia.   PROCEDURE:  Chiari decompression with duraplasty.   SURGEON:  Payton Doughty, M.D.   ANESTHESIA:  General endotracheal.   PREP:  Sterile Betadine prep and scrub with alcohol wipe.   COMPLICATIONS:  None.   ASSISTANT:  Hilda Lias, M.D.   BODY OF TEXT:  This is a 35 year old girl with symptomatic Chiari and a  large cervical syrinx.  She is taken to operating room, smoothly  anesthetized, intubated, placed prone in [place] head holder.  Following shave, prep and drape in usual sterile fashion skin was  infiltrated with 1% lidocaine 1:400,000 epinephrine.  The skin was  incised from the inion down to the bottom of C2.  The occipital squama  down the foramen magnum and the lamina of C1-C2 were dissected free in  the subperiosteal plane.  Using the perforator and the high-speed drill,  posterior fossa craniectomy was carried out to the foramen magnum and  the ring of C1 was removed and the top approximately third of C2 lamina  was removed.  The dura thus exposed was opened.  The cerebellar tonsils  were found to be low-lying, their tips reaching just about the bottom of  C1.  The obex was inspected and found to have free egress of spinal  fluid.  Wound was irrigated and hemostasis assured on all edges and the  duraplasty using bovine pericardium was done, sewn in with 4-0 Nurolon.  It was then inspected with Valsalva and one spot was re-enforced.  Repeat Valsalva did not show any leak.   Hemostasis was once again  assured.  Successive layers of 0 Vicryl, 2-0 Vicryl and 3-0 nylon were  used to close.  Betadine Telfa dressing was applied, made occlusive with  OpSite and the patient taken out of pins and returned to recovery room  in good condition.           ______________________________  Payton Doughty, M.D.     MWR/MEDQ  D:  11/20/2007  T:  11/21/2007  Job:  [id number]

## 2011-01-10 NOTE — Op Note (Signed)
[name], CURNOW NO.:  [id number]   MEDICAL RECORD NO.:  [id number]          PATIENT TYPE:  INP   LOCATION:  3022                         FACILITY:  [facility]   PHYSICIAN:  Payton Doughty, M.D.      DATE OF BIRTH:  Jan 03, 1976   DATE OF PROCEDURE:  01/03/2008  DATE OF DISCHARGE:                               OPERATIVE REPORT   PREOPERATIVE DIAGNOSIS:  Myositis.   POSTOPERATIVE DIAGNOSIS:  Myositis.   PROCEDURES:  Left deltoid muscle biopsy.   SURGEON:  Payton Doughty, M.D.   ANESTHESIA:  LMA.   COMPLICATIONS:  None.   ASSISTANT:  No assistant.   This is a 35 year old girl with some diagnosis of myositis, taken to the  operating room, was anesthetized, had LMA placed.  Following shave,  prepped and draped in usual sterile fashion.  Skin in the anterior  portion of the left deltoid was incised approximately 4-cm long.  The  muscle fascia was identified and opened.  Three muscle biopsies were  taken, tied on to tongue depressors with 3-0 silk and sent off as  specimen in saline sponges.  Wound was irrigated.  Hemostasis assured  and successive layers of 3-0 Vicryl and 3-0 nylon were used to close.  Betadine and Telfa dressing was applied made occlusive with OpSite.  The  patient returned to recovery room in good condition.           ______________________________  Payton Doughty, M.D.     MWR/MEDQ  D:  01/03/2008  T:  01/04/2008  Job:  [id number]

## 2011-01-10 NOTE — H&P (Signed)
NAMELAWAN, NANEZ NO.:  [id number]   MEDICAL RECORD NO.:  [id number]          PATIENT TYPE:  INP   LOCATION:  3022                         FACILITY:  [facility]   PHYSICIAN:  Payton Doughty, M.D.      DATE OF BIRTH:  1976/08/12   DATE OF ADMISSION:  01/02/2008  DATE OF DISCHARGE:                              HISTORY & PHYSICAL   ADMITTING DIAGNOSIS:  Generalized weakness.   HISTORY:  This is a 35 year old right hand black girl who on November 20, 2007 underwent decompression of a Chiari malformation with a very large  syrinx.  Immediate postoperative, she did well.  Her symptoms prior to  operation had been some mild dystaxia.  Since she convalesced in the  hospital for several days, went home.  Since the time of her operation,  she has been reportedly having increasing weakness both in the upper and  lower extremities, proximally more so than distally.  She notes she has  had some atrophy at the deltoid.  She is having trouble picking her head  up after she bends forward and has difficulty maintaining in upright  posture due to fatigue.  She had been having increasing trouble getting  up and standing.   PAST MEDICAL HISTORY:  Unremarkable.   MEDICATIONS:  She is no medications.   ALLERGIES:  She has no allergies.   SOCIAL HISTORY:  She has a 20 year old daughter.  She does not smoke or  drink and works at an [employer] and I am not 100% sure what that is.   FAMILY HISTORY:  Noncontributory.   REVIEW OF SYSTEMS:  Remarkable for the weakness that we have been  talking about.   PHYSICAL EXAMINATION:  HEENT:  Within normal limits.  She has a well-  healed incision.  NECK:  No lymphadenopathy.  CHEST:  Clear.  CARDIAC EXAM:  Regular rate and rhythm.  NEUROLOGICALLY:  She is awake, alert, and oriented.  Her pupils are  equal, round, and reactive to light.  Extraocular movements are intact.  The patient's  movement and sensation appeared to be intact.   Tongue  protrudes in the midline.  Shoulder shrug is slightly weak.  Deltoid is  about 3/5.  Biceps are bilaterally 4/5 and grip is 5/5.  In lower  extremities, hip flexors and knee extensors are 2.  Dorsi and plantar  flexors are 4.  She has 1+ reflexes throughout the upper and lower  extremities.  Toes are ongoing bilaterally and Hoffman is negative.  She  had an MRI done a couple of weeks ago that demonstrates good Chiari  decompression.  There is an extra axial fluid collection outside of the  duraplasty.   There is no evidence of hydrocephalus.   CLINICAL IMPRESSION:  Generalized weakness that is not consistent with  myelopathy concern.  This does not come from the syrinx or the Chiari.  On an MRI reading preoperatively, it was noted that she had some edema  in the  muscles and I am concerned that she is developing a primary myelopathy.  She is  going to be admitted.  We will do an image of her cervical spine  and we will also have the neurologist to see her to see what they think  about the possibility of this being myelopathy.           ______________________________  Payton Doughty, M.D.     MWR/MEDQ  D:  01/02/2008  T:  01/03/2008  Job:  [id number]

## 2011-01-10 NOTE — Discharge Summary (Signed)
[name], [name] NO.:  [id number]   MEDICAL RECORD NO.:  [id number]          PATIENT TYPE:  INP   LOCATION:  3008                         FACILITY:  [facility]   PHYSICIAN:  Payton Doughty, M.D.      DATE OF BIRTH:  01-23-1976   DATE OF ADMISSION:  11/20/2007  DATE OF DISCHARGE:  11/26/2007                               DISCHARGE SUMMARY   ADMITTING DIAGNOSIS:  Chiari malformation with cervical syringomyelia.   DISCHARGE DIAGNOSIS:  Chiari malformation with cervical syringomyelia.   PROCEDURES:  Chiari decompression.   SURGEON:  Payton Doughty, M.D.   COMPLICATIONS:  None.   DISCHARGE STATUS:  Alive and well by techs.   A 35 year old right hand black girl whose history and physical is  recounted in the chart.  She had been stumbling and had some clumsiness,  and MRI showed Chiari malformation with syrinx.  She was admitted for  decompression.   General examination was intact.  Neurologic examination was remarkable  for cervical myelopathy.  She was admitted after ascertainment of normal  laboratory values and underwent a Chiari decompression and duraplasty.  Postoperatively, she has done well.  She is in the ICU for 4-5 days with  some headache, nausea, and vomiting, which has resolved.  Currently, she  is awake, alert.  Strength is full.  Incision is dry and well healing.  She can get up and down and has some slight heaviness of her arms.  Her  Hoffman's is now negative.  She is being discharged to home in care of  her family with Percocet for pain.  Her followup will be in [place]  office next week for suture removal.  At some point in time, she will  need to have another MR to check on the status of her syrinx that should  be arranged after healing has been allowed to occur.           ______________________________  Payton Doughty, M.D.     MWR/MEDQ  D:  11/26/2007  T:  11/26/2007  Job:  [id number]

## 2011-01-10 NOTE — H&P (Signed)
[name], VUKELICH               ACCOUNT NO.:  [id number]   MEDICAL RECORD NO.:  [id number]          PATIENT TYPE:  INP   LOCATION:  3033                         FACILITY:  [facility]   PHYSICIAN:  Casimiro Needle L. Reynolds, M.D.DATE OF BIRTH:  [birth year]   DATE OF ADMISSION:  04/07/2008  DATE OF DISCHARGE:                              HISTORY & PHYSICAL   This is a pleasant 35 year old African American female with a history of  headache and falls as well as shoulder pain secondary to Chiari  malformation with syrinx from C2-T1.  Patient underwent a decompression  by Dr. Channing Mutters on November 20, 2007 with improvement in shoulder pain but  continued weakness.  The patient has been seen by Dr. Thad Ranger and is  well known by Dr. Thad Ranger, diagnosed with polymyositis and  quadriparesis.  Patient has recently had treatments with IV IgG last  week without showing any significant benefits.  Patient has also been on  p.o. steroids, 80 mg daily.  Patient at this time lives with her mother  and has been attending outpatient physical therapy at [facility] three  times a week for both physical therapy and occupational therapy.  Patient was recently seen in Dr. Thad Ranger office today and is admitted  today for polymyositis and quadriparesis, primarily for placement as her  current home situation is no longer tenable.   PAST MEDICAL HISTORY:  Positive for syrinx, Chiari malformation,  polymyositis.   MEDICATIONS:  Patient is on a multivitamin, Allegra, Flonase p.r.n..  As  stated, 80 mg of prednisone daily.  Ambien 10 mg p.o. nightly p.r.n.  Tylenol 650 mg p.o. q.4-6h. p.r.n. headache.   ALLERGIES:  No known allergies.   REVIEW OF SYSTEMS:  GENERAL:  She admits to weakness.  HEMATOLOGY:  Denies anemia, increased bleeding, coagulopathy.  HEENT:  Denies trauma,  nausea, vomiting, dizziness, vertigo.  RESPIRATORY:  Denies cough,  shortness of breath, asthma, bronchitis.  CARDIOVASCULAR:  Denies  hypertension,  palpitations, angina.  GI:  Denies any diarrhea or  constipation.  GU:  Denies polyuria, dysuria, nocturia.  ENDOCRINE:  Denies diabetes, increased or decreased hunger.  VASCULAR:  Denies any  claudication, peripheral arterial disease.  MUSCULOSKELETAL:  No joint  pain, stiffness.  However, she does have weakness.  UROLOGY:  Admits to  weakness and headache x2 days but no blackouts or tremor.  PSYCH:  Negative for anxiety or depression.   PHYSICAL EXAMINATION:  This is a pleasant 35 year old [place] American  female.  Blood pressure 130/80, pulse 80, respiratory 16, temperature 98.6.  MENTAL STATUS:  She is alert and oriented.  Carries out two-step  commands.  Speech is fluent and appropriate.  Cranial nerves:  Pupils  are equal, round and reactive to light.  Conjugate gaze.  Extraocular  muscles are intact.  Negative ptosis.  Negative for visual field  deficits.  Negative for nystagmus.  At this time, her face is  symmetrical.  Tongue is midline.  Uvula is midline.  She has no sensory  deficits.  Shoulder shrug and head turn are 4/5 bilaterally.  Patient  has inability to raise her arm  at her shoulder; however, she is able to  touch my finger, raising it from the bed without any dysmetria.  Fine  motor movements are equal bilaterally.  Gait was not tested secondary to  patient being wheelchair-bound.  As far as motor, she is approximately  upper extremities 3/5 in the shoulders, 3/5 in the triceps extension,  5/5 with biceps flexion, 5/5 with wrist flexion and extension.  She has  a 2-3/5 in her hip flexion and extension, 3/5 with bilateral knee  extension, 4/5 with bilateral knee flexion, 5/5 with dorsiflexion and  plantar flexion of her ankle.  Deep tendon reflexes are trace bilateral  knees.  She has trace bilateral ankles, bilateral downgoing toes, trace  reflexes at her biceps and triceps bilaterally.  Sensation is full to  pinprick, light touch, and vibration throughout.   PULMONARY:  She is clear to auscultation bilaterally.  CARDIOVASCULAR:  S1 and S2.  Regular rate and rhythm.  NECK:  Negative for bruits.  Neck is supple.   Labs at this time are still pending.   IMAGING/TESTS:  At this time, no imaging or tests are available.   ASSESSMENT:  This is a 34 year old African American female admitted with  a diagnosis of polymyositis.  She has finished her IV IgG without any  significant improvement.  She is still taking p.o. steroids.   PLAN:  Have the patient admitted to continue physical therapy,  occupational therapy, rehabilitation, and social worker to help with  placement at this time.     ______________________________  Conni Elliot, M.D.      Michael L. Thad Ranger, M.D.  Electronically Signed    ASG/MEDQ  D:  04/07/2008  T:  04/07/2008  Job:  [id number]

## 2011-01-10 NOTE — Discharge Summary (Signed)
[name], [name]               ACCOUNT NO.:  [id number]   MEDICAL RECORD NO.:  [id number]          PATIENT TYPE:  INP   LOCATION:  3033                         FACILITY:  [facility]   PHYSICIAN:  Casimiro Needle L. Reynolds, M.D.DATE OF BIRTH:  [birth year]   DATE OF ADMISSION:  04/07/2008  DATE OF DISCHARGE:  04/16/2008                               DISCHARGE SUMMARY   ADMISSION DIAGNOSIS:  Dermatomyositis with quadriparesis.   DISCHARGE DIAGNOSIS:  1. Dermatomyositis with quadriparesis, stable, with little improvement      on prednisone, minimal response to intravenous immunoglobulin,      recently started on methotrexate.  2. Renal stone with hematuria, resolved.  3. Elevated blood pressure, improved with medications.  4. Hypokalemia, resolved.   CONDITION AT DISCHARGE:  Stable.   DIET:  Regular.   ACTIVITY:  Ad lib per therapist and rehab team.   MEDICATIONS AT DISCHARGE:  1. Prednisone 60 mg daily.  2. Multivitamin daily.  3. Colace 100 mg b.i.d.  4. Protonix 40 mg daily.  5. Potassium 20 mEq daily.  6. Methotrexate 10 mg weekly (Wednesday).  7. Atenolol 25 mg daily.  8. HCTZ 25 mg daily.  9. Ambien 10 mg at bedtime p.r.n.  10.Tylenol 650 mg p.o. q.4 h. p.r.n.  11.Tramadol 50 mg q.3 h. p.r.n.   STUDIES:  1. CT of the abdomen and pelvis done without contrast on April 13, 2008, demonstrating 3 x 5 mm calculus in the region of the right      ureterovesical junction. with right ureteral dilatation.  2. Laboratory review.  CBC on admission:  White count 8.9, hemoglobin      16.9, platelets 186,000.  White count 17.9, with hemoglobin 15.1 on      April 11, 2008.  BMET on discharge:  Sodium 138, potassium 4,      chloride 105, CO2 27, glucose 87, BUN 11, creatinine 0.3.  Urine      culture from April 09, 2008 negative.  CPK on admission 716, down      to 264 on discharge on April 11, 2008.   HOSPITAL COURSE:  Please see admission H&P for full admission details.  Briefly, this is a 35 year old woman with dermatomyositis with  quadriparesis who had become unable to care for self at home was  admitted primarily for pursuit of longer-term placement in a skilled  nursing-level rehab facility.  1. Dermatomyositis with quadriparesis.  She was admitted due to this      problem.  She had been on prednisone 80 mg daily for this, which      was reduced to 60 mg daily on admission.  She had had two course of      intravenous gammaglobulin, to which she had not responded      adequately.  It was felt that she needed further immunotherapy for      her dermatomyositis,  and methotrexate 10 mg weekly was started on      April 08, 2008.  She tolerated this well, but her strength changed      very little throughout  her hospital stay.  2. Renal stone with hematuria.  The patient had hematuria and some      urinary complaints when she received gamma globulin on April 03, 2008.  At the time of admission, a urinalysis was repeated, and she      was found to have hematuria with only minimal blood.  She will be      placed on Bactrim as an outpatient.  She had a urine culture done      in the hospital, which did not grow any particular organisms.  She      then began to complain of pain in the right side of the pelvis, and      at that point had a CT of the abdomen and pelvis which did      demonstrate the presence of a renal stone.  She had significant      problems with pain throughout the middle part of her hospital stay,      but by the morning by the morning of April 15, 2008 stated that      her pain resolved and had no further pain.  The presumption is that      she passed her stone.  3. Elevated blood pressure.  Throughout the early part of her stay      here, she had elevated blood pressure in the 160/100 range, with      heart rates that were often in the 90-100 range.  She was started      on a beta blocker, and this helped somewhat, although her  blood      pressure continued to be high.  Hydrochlorothiazide was later      added, and this resulted in still better control.  4. Hypokalemia.  She had hypokalemia on her initial labs, but this      resolved with potassium replacement.   DISPOSITION:  She is discharged to a skilled nursing facility at this  time.  She will follow up in the office of [facility] with me and one of the nurse practitioners within the next 3-  4 weeks, for further adjustments of her medications.      Michael L. Thad Ranger, M.D.  Electronically Signed     MLR/MEDQ  D:  04/16/2008  T:  04/16/2008  Job:  [id number]

## 2011-01-10 NOTE — H&P (Signed)
[name], CHRISTON NO.:  [id number]   MEDICAL RECORD NO.:  [id number]          PATIENT TYPE:  INP   LOCATION:  3172                         FACILITY:  [facility]   PHYSICIAN:  Payton Doughty, M.D.      DATE OF BIRTH:  1976/06/27   DATE OF ADMISSION:  11/20/2007  DATE OF DISCHARGE:                              HISTORY & PHYSICAL   ADMISSION DIAGNOSIS:  Chiari malformation with syringomyelia   SERVICE:  Neurosurgery.   A 35 year old right-handed black girl seen in the emergency room the day  before yesterday. She had been having history of some stumbling and a  little bit of clumsiness in her hands.  MRI of the brain and cervical  spine were obtained that shows a Chiari type 1 with attendant  syringomyelia, and she is now admitted for decompression.   PAST MEDICAL HISTORY:  Unremarkable.   MEDICATIONS:  She has on no medications.   ALLERGIES:  Has no allergies.   PAST SURGICAL HISTORY:  Has had no operations.   FAMILY HISTORY:  Not germane.   SOCIAL HISTORY:  She does not smoke or drink and works in [employer],  whatever that is.   REVIEW OF SYSTEMS:  Remarkable for occasional headaches.  No facial  numbness.  No difficulty swallowing.   PHYSICAL EXAMINATION:  HEENT:  Exam within normal limits.  NECK:  She has good range of motion of her neck.  CHEST:  Clear.  CARDIAC:  Regular rate and rhythm.  ABDOMEN:  Nontender.  No hepatosplenomegaly.  EXTREMITIES:  Without clubbing or cyanosis.  GU:  Exam is deferred.  PULSES:  Peripheral pulses are good.  NEUROLOGIC:  She is awake, alert and oriented.  Her cranial nerves are  intact.  This includes tongue and the shoulder shrug.  Motor exam shows  5/5 strength throughout the upper and lower extremities.  No current  sensory deficit.  Reflexes are 3 at the biceps and triceps, 3 at the  brachioradialis. Hoffman's is positive, worse on the left than on the  right. Lower extremity reflexes are 3.  Toes are  downgoing.  There is no  clonus.   MR results have been reviewed above.   CLINICAL IMPRESSION:  Chiari malformation with syringomyelia extending  from C1 down to T1.   PLAN:  Chiari decompression and duraplasty. The risks and benefits have  been discussed with her.  She wishes to proceed.   .           ______________________________  Payton Doughty, M.D.     MWR/MEDQ  D:  11/20/2007  T:  11/20/2007  Job:  [id number]

## 2011-01-10 NOTE — Assessment & Plan Note (Signed)
[name] is back regarding her dermatomyositis.  She had another trial of  IVIG without benefit.  She is having her steroids tapered.  It sounds as  if Neurology is recommending a rheumatological followup.  She has gained  a bit of strength back, but still requires sliding board for transfers.  She can stand for a minute or so holding on to the parallel bars.  She  is moved to [employer] as tension and stressful too much at  home apparently.  She denies pain.  Mood had been fair.  She has lost  some weight since prednisone has been decreased but still is overall  positive with her weight.   REVIEW OF SYSTEMS:  Notable for ongoing weakness and trouble walking.  She does feel arms have increased a good bit proximally in strength.  Other pertinent positives as above and full review is in the written  health and history section on the chart.   PHYSICAL EXAMINATION:  VITAL SIGNS:  Blood pressure is 134/78, pulse is  85, respiratory rate 18, and she is saturating 100% on room air.  GENERAL:  The patient is pleasant, alert and oriented x3.  Weight  overall is stable, but face is quite puffy and cushingoid in appearance.  Strength is improved at the shoulders at 3-/5.  She is 5/5 distally.  Right hip is 2/5, left hip is 3/5.  She is 3/4 at the ankles and feet  bilaterally.  Sensory exam is normal.  No pain was seen today.  She has  intact range of motion, also she is a bit tight at the shoulders.  Cognition is appropriate.  Cranial nerve exam is intact.  HEART:  Regular rate and rhythm.  CHEST:  Clear.  ABDOMEN:  Soft and nontender.   ASSESSMENT:  Dermatomyositis with quadriparesis chiefly proximal areas  and low and then distal.   PLAN:  1. I really do not have anything to offer her.  She needs to continue      with her therapies at this point to improve her truncal control and      standing balance.  She is not at the point where she can walk yet.  2. She needs rheumatology  followup, it appears to further workup this      problem.  It looks like she will need to go to [facility] or [place] for further examination.  3. Continue steroids per Dr. Thad Ranger' recommendations.  4. I will see her back in 3 months for followup.      Ranelle Oyster, M.D.  Electronically Signed     ZTS/MedQ  D:  04/28/2008 12:43:09  T:  04/29/2008 02:47:41  Job #:  [id number]   cc:   Casimiro Needle L. Thad Ranger, M.D.  Fax: [phone]

## 2011-01-10 NOTE — Consult Note (Signed)
[name], [name]               ACCOUNT NO.:  [id number]   MEDICAL RECORD NO.:  [id number]          PATIENT TYPE:  INP   LOCATION:  3022                         FACILITY:  [facility]   PHYSICIAN:  Casimiro Needle L. Reynolds, M.D.DATE OF BIRTH:  1976/01/22   DATE OF CONSULTATION:  01/02/2008  DATE OF DISCHARGE:                                 CONSULTATION   REASON FOR EVALUATION:  Suspected myopathy.   HISTORY OF PRESENT ILLNESS:  This is the initial inpatient consultation  evaluation of this 35 year old woman with little past medical history.  She states that she started having problems with weakness in her legs in  February, including difficulty with things such as going up and down  stairs or getting out of cars.  She saw her primary physician about a  couple of times, and underwent a workup including MRI of the brain and  cervical spine.  At that time, she was found to have a large Chiari  malformation with associated cervical syrinx involving the entire length  of the cervical and to some extent into the upper thoracic spine.  She  was referred to neurosurgery and underwent a decompression of the syrinx  by Dr. Trey Sailors on November 20, 2007.  She states that since that time, the  numbness which she is experiencing in her left arm has gotten much  better.  However, she has continued to have difficulty with weakness of  her lower extremities and getting around in her house, to the point she  requires assistance with ambulation.  This has not improved, in fact  gotten a little bit worse.  She also complains of weakness in her upper  extremities and difficulty reaching for things up above her head.  She  denies any difficulty with dysarthria, but does report some occasional  dysphagia.  She denies any visual symptoms or diplopia.  Other than  numbness in her arm which is better, she has not had any sensory changes  which she is aware.  She also has some low back pain which she relates  to the  difficulty she has with sitting up and standing and moving  around.  She does not have any difficulty with control of bowel or  bladder function.  Since her surgery, she had an MRI of her neuraxis,  which showed no abnormalities in the brain, improvement in her cervical  syrinx, and edematous proximal muscles including psoas, gluteals, and  paraspinal in the lumbar area.  Dr. Channing Mutters became concerned that she had a  myopathy, and admitted her for evaluation and neurologic opinion.  The  patient at present is alert and feels well.  On further questioning, she  does report she has had some swelling of her ankles, especially on the  right, which is painful at times although not really red or inflamed.  Beyond that she denies any joint inflammation.  She says a friend has  noticed a little bit of rash on the right side of her neck, but she  really has not had any rashes which she is aware.   PAST MEDICAL HISTORY:  Except as above, she denies chronic medical  problems.   FAMILY HISTORY:  She denies any family history of neuromuscular disease  or muscular dystrophy.   SOCIAL HISTORY:  She does admit to marijuana use in the past, last about  8 months ago.  She denies any history tobacco or alcohol use.   REVIEW OF SYSTEMS:  She does report occasional headaches, but not more  than once a month.  Otherwise unremarkable except as outlined in the  HPI.   MEDICATIONS:  None.   ALLERGIES:  Denies.   PHYSICAL EXAMINATION:  VITAL SIGNS:  Temperature 98.2, blood pressure  113/73, pulse 84, respirations 18, O2 sat 90% on room air.  GENERAL:  This is a healthy-appearing woman in no distress.  HEAD:  Cranium was normocephalic and atraumatic.  Oropharynx benign.  NECK:  Supple without carotid or subclavicular bruits.  HEART:  Regular rate and rhythm without murmurs.  MENTAL STATUS:  She is awake and alert.  She is fully oriented to time,  place, and person.  Recent and remote memory are intact.   Attention  span, concentration, fund of knowledge are all appropriate.  Speech is  fluent and not dysarthric.  Mood is euthymic and affect appropriate.  CRANIAL NERVES:  Pupils are equal and reactive.  Extraocular movements  are full without nystagmus.  Visual fields full to confrontation.  Hearing is intact to conversational speech.  Facial sensation is intact  to pinprick.  She did have a bit of facial weakness in eye closure and  cheek puff.  Tongue and palate move normally and symmetrically.  She has  evident weakness of neck flexion, extension, 4-/5.  MOTOR:  Normal bulk and tone.  On strength testing, she has 2/5 strength  in the deltoids and external rotators, 3/5 strength in biceps and  triceps, 4/5 strength in the finger extensors, and full strength in the  wrist flexors and intrinsic muscles of the hands.  In the lower  extremities, she has 1-2/5 strength in hip flexors and quads, 3/5  strength in the hamstrings, dorsiflexors 4 on the right and 5 on the  left, plantar flexors full strength, toe extensors full strength.  Sensory intact to light touch light,  pinprick, and double stimulation  in all extremities.  COORDINATION:  Finger-to-nose performed accurately given the degree of  weakness.  Heel-to-shin is deferred.  Gait is deferred.  Reflexes are  diminished throughout.  Toes are downgoing bilaterally.   LABORATORY DATA:  CBC on admission, white count 7.9, hemoglobin 30.1,  platelets 386,000.  CMET unremarkable except for AST and ALT of 263 and  115 respectively with a slight low albumin.  Total CPK is 10,340.  Her  most recent neuro imaging was a MRI of C-spine done this morning, which  showed significant decompression of the cervical cord syrinx with normal  size cord on today's examination without testicular abnormality.  Changes of suboccipital craniotomy with significant decompression of the  previous Chiari malformation are present.  MRI of the brain done on  April  14 showed the changes of suboccipital craniectomy as above with  fluid collection in the region of craniectomy, CSF leak versus normal  postoperative finding.  Otherwise, normal brain parenchyma.   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  Suspect polymyositis versus manifestation of a connective tissue  disorder given that she does have some rashes and joint swelling.   RECOMMENDATIONS:  We can check some labs including ANA, rheumatoid  factor, C-reactive protein.  I will suggest rheumatologic evaluation,  and with Dr. Temple Pacini consent have asked Dr. Estill Bakes to evaluate her.  She  needs a muscle biopsy, and Dr. Channing Mutters and I have discussed that.  Hopefully, she can have it later today.  An EMG can be performed, but I  doubt it will change her management very much.  Once the muscle biopsy  is done, will initiate therapy with steroids or possibly IVIG.  We will  follow with you.  Thank you for the consultation.      Michael L. Thad Ranger, M.D.  Electronically Signed     MLR/MEDQ  D:  01/03/2008  T:  01/04/2008  Job:  [id number]

## 2011-05-22 LAB — URINALYSIS, ROUTINE W REFLEX MICROSCOPIC
Bilirubin Urine: NEGATIVE
Glucose, UA: NEGATIVE
Hgb urine dipstick: NEGATIVE
Ketones, ur: NEGATIVE
Ketones, ur: NEGATIVE
Leukocytes, UA: NEGATIVE
Leukocytes, UA: NEGATIVE
Nitrite: NEGATIVE
Nitrite: NEGATIVE
Nitrite: NEGATIVE
Protein, ur: 30 — AB
Protein, ur: NEGATIVE
Specific Gravity, Urine: 1.026
Specific Gravity, Urine: 1.031 — ABNORMAL HIGH
Urobilinogen, UA: 0.2
pH: 5.5

## 2011-05-22 LAB — COMPREHENSIVE METABOLIC PANEL
ALT: 152 — ABNORMAL HIGH
ALT: 197 — ABNORMAL HIGH
AST: 185 — ABNORMAL HIGH
AST: 206 — ABNORMAL HIGH
Albumin: 3.9
Alkaline Phosphatase: 48
Alkaline Phosphatase: 52
BUN: 10
BUN: 15
BUN: 2 — ABNORMAL LOW
CO2: 24
CO2: 25
Calcium: 8.3 — ABNORMAL LOW
Calcium: 8.9
Calcium: 9.4
Chloride: 104
Chloride: 99
Creatinine, Ser: 0.37 — ABNORMAL LOW
Creatinine, Ser: 0.46
GFR calc Af Amer: >60
GFR calc Af Amer: >60
GFR calc non Af Amer: >60
GFR calc non Af Amer: >60
Glucose, Bld: 79
Potassium: 3.4 — ABNORMAL LOW
Sodium: 129 — ABNORMAL LOW
Sodium: 138
Total Bilirubin: 0.6
Total Bilirubin: 0.7
Total Protein: 6.4
Total Protein: 7.2

## 2011-05-22 LAB — PROTIME-INR: Prothrombin Time: 13.4

## 2011-05-22 LAB — TYPE AND SCREEN: Antibody Screen: NEGATIVE

## 2011-05-22 LAB — RAPID URINE DRUG SCREEN, HOSP PERFORMED
Amphetamines: NOT DETECTED
Benzodiazepines: NOT DETECTED
Cocaine: NOT DETECTED
Tetrahydrocannabinol: POSITIVE — AB

## 2011-05-22 LAB — DIFFERENTIAL
Basophils Absolute: 0
Basophils Relative: 0
Eosinophils Absolute: 0.1
Lymphocytes Relative: 24
Monocytes Absolute: 0.6
Neutro Abs: 5.7
Neutro Abs: 6.9
Neutrophils Relative %: 66

## 2011-05-22 LAB — CBC
HCT: 43.7
HCT: 46.4 — ABNORMAL HIGH
Hemoglobin: 14.7
MCHC: 33.7
MCHC: 33.9
MCV: 91.3
Platelets: 456 — ABNORMAL HIGH
RBC: 4.83
RDW: 15.2
RDW: 15.3
WBC: 8.4

## 2011-05-22 LAB — PREGNANCY, URINE: Preg Test, Ur: NEGATIVE

## 2011-05-22 LAB — APTT: aPTT: 26

## 2011-05-22 LAB — HEPATITIS PANEL, ACUTE: Hep B C IgM: NEGATIVE

## 2011-05-22 LAB — URINE MICROSCOPIC-ADD ON

## 2011-05-26 LAB — URINALYSIS, ROUTINE W REFLEX MICROSCOPIC
Bilirubin Urine: NEGATIVE
Bilirubin Urine: NEGATIVE
Bilirubin Urine: NEGATIVE
Glucose, UA: NEGATIVE
Glucose, UA: NEGATIVE
Ketones, ur: NEGATIVE
Leukocytes, UA: NEGATIVE
Nitrite: NEGATIVE
Nitrite: NEGATIVE
Protein, ur: 100 — AB
Specific Gravity, Urine: 1.017
Specific Gravity, Urine: 1.019
Specific Gravity, Urine: <1.005 — ABNORMAL LOW
Urobilinogen, UA: 0.2
Urobilinogen, UA: 0.2
pH: 5
pH: 8

## 2011-05-26 LAB — CBC
HCT: 50 — ABNORMAL HIGH
Platelets: 186
WBC: 8.9

## 2011-05-26 LAB — URINE CULTURE: Colony Count: 70000

## 2011-05-26 LAB — URINE MICROSCOPIC-ADD ON

## 2011-05-26 LAB — COMPREHENSIVE METABOLIC PANEL
AST: 65 — ABNORMAL HIGH
Albumin: 2.5 — ABNORMAL LOW
BUN: 9
Calcium: 8.5
Chloride: 107
Creatinine, Ser: 0.32 — ABNORMAL LOW
GFR calc Af Amer: >60
Total Protein: 8.2

## 2011-05-26 LAB — CK: Total CK: 716 — ABNORMAL HIGH

## 2011-07-12 ENCOUNTER — Other Ambulatory Visit: Payer: Self-pay | Admitting: Neurosurgery

## 2011-07-12 DIAGNOSIS — G950 Syringomyelia and syringobulbia: Secondary | ICD-10-CM

## 2011-07-13 ENCOUNTER — Ambulatory Visit
Admission: RE | Admit: 2011-07-13 | Discharge: 2011-07-13 | Disposition: A | Discharge status: Home / Self Care | Payer: Medicare Other | Source: Ambulatory Visit | Attending: Neurosurgery | Admitting: Neurosurgery

## 2011-07-13 DIAGNOSIS — G950 Syringomyelia and syringobulbia: Secondary | ICD-10-CM

## 2011-08-07 ENCOUNTER — Other Ambulatory Visit: Payer: Self-pay | Admitting: Surgery

## 2011-10-17 ENCOUNTER — Ambulatory Visit: Payer: Medicare Other | Admitting: Physical Therapy

## 2011-10-19 ENCOUNTER — Ambulatory Visit: Payer: Medicare Other | Attending: Clinical Neurophysiology | Admitting: Physical Therapy

## 2011-10-19 DIAGNOSIS — M6281 Muscle weakness (generalized): Secondary | ICD-10-CM | POA: Insufficient documentation

## 2011-10-19 DIAGNOSIS — IMO0001 Reserved for inherently not codable concepts without codable children: Secondary | ICD-10-CM | POA: Insufficient documentation

## 2011-10-19 DIAGNOSIS — R269 Unspecified abnormalities of gait and mobility: Secondary | ICD-10-CM | POA: Insufficient documentation

## 2011-10-26 ENCOUNTER — Ambulatory Visit: Payer: Medicare Other | Admitting: Physical Therapy

## 2011-10-31 ENCOUNTER — Encounter (HOSPITAL_COMMUNITY): Payer: Self-pay | Admitting: Emergency Medicine

## 2011-10-31 ENCOUNTER — Emergency Department (HOSPITAL_COMMUNITY)
Admission: EM | Admit: 2011-10-31 | Discharge: 2011-11-01 | Disposition: A | Discharge status: Home / Self Care | Payer: Medicare Other | Attending: Emergency Medicine | Admitting: Emergency Medicine

## 2011-10-31 DIAGNOSIS — J329 Chronic sinusitis, unspecified: Secondary | ICD-10-CM | POA: Insufficient documentation

## 2011-10-31 MED ORDER — AMOXICILLIN 500 MG PO CAPS: 1000.0000 mg | ORAL_CAPSULE | Freq: Two times a day (BID) | ORAL | Status: AC

## 2011-10-31 MED ORDER — CETIRIZINE-PSEUDOEPHEDRINE ER 5-120 MG PO TB12: 1.0000 | ORAL_TABLET | Freq: Every day | ORAL | Status: AC

## 2011-10-31 NOTE — Discharge Instructions (Signed)
Continue to take tylenol or motrin for pain. Use saline nasal spray sold over the counter every 2 hrs for congestion. Take zyrtec d as prescribed daily. Take amoxicillin as prescribed until all gone. Follow up with your doctor for recheck in 3-5 days if not improving.  Sinusitis Sinuses are air pockets within the bones of your face. The growth of bacteria within a sinus leads to infection. The infection prevents the sinuses from draining. This infection is called sinusitis. SYMPTOMS  There will be different areas of pain depending on which sinuses have become infected.  The maxillary sinuses often produce pain beneath the eyes.   Frontal sinusitis may cause pain in the middle of the forehead and above the eyes.  Other problems (symptoms) include:  Toothaches.   Colored, pus-like (purulent) drainage from the nose.   Swelling, warmth, and tenderness over the sinus areas may be signs of infection.  TREATMENT  Sinusitis is most often determined by an exam.X-rays may be taken. If x-rays have been taken, make sure you obtain your results or find out how you are to obtain them. Your caregiver may give you medications (antibiotics). These are medications that will help kill the bacteria causing the infection. You may also be given a medication (decongestant) that helps to reduce sinus swelling.  HOME CARE INSTRUCTIONS   Only take over-the-counter or prescription medicines for pain, discomfort, or fever as directed by your caregiver.   Drink extra fluids. Fluids help thin the mucus so your sinuses can drain more easily.   Applying either moist heat or ice packs to the sinus areas may help relieve discomfort.   Use saline nasal sprays to help moisten your sinuses. The sprays can be found at your local drugstore.  SEEK IMMEDIATE MEDICAL CARE IF:  You have a fever.   You have increasing pain, severe headaches, or toothache.   You have nausea, vomiting, or drowsiness.   You develop unusual  swelling around the face or trouble seeing.  MAKE SURE YOU:   Understand these instructions.   Will watch your condition.   Will get help right away if you are not doing well or get worse.  Document Released: 08/14/2005 Document Revised: 08/03/2011 Document Reviewed: 03/13/2007 [facility] Patient Information 2012 [place], [place].

## 2011-10-31 NOTE — ED Provider Notes (Signed)
History     CSN: [id number]  Arrival date & time 10/31/11  2149   First MD Initiated Contact with Patient 10/31/11 2336      Chief Complaint  Patient presents with  . Sinusitis    (Consider location/radiation/quality/duration/timing/severity/associated sxs/prior treatment) Patient is a 36 y.o. female presenting with sinusitis. The history is provided by the patient.  Sinusitis  This is a new problem. The current episode started more than 2 days ago. The problem has not changed since onset.The maximum temperature recorded prior to her arrival was 100 to 100.9 F. The fever has been present for 1 to 2 days. The pain is at a severity of 5/10. The pain is mild. The pain has been constant since onset. Associated symptoms include chills, sweats, congestion, ear pain, sinus pressure, sore throat and cough. Pertinent negatives include no swollen glands and no shortness of breath. She has tried acetaminophen for the symptoms. The treatment provided no relief.  Pt states she continues to have pressure over her sinuses for a week, thick mucus with blood in it. States tried to take over the counter cold medications with no relief. Denies neck pain or stiffness. Denies sob. Denies n/v/d.  History reviewed. No pertinent past medical history.  Past Surgical History  Procedure Date  . Brain surgery     History reviewed. No pertinent family history.  History  Substance Use Topics  . Smoking status: Not on file  . Smokeless tobacco: Not on file  . Alcohol Use:     OB History    Grav Para Term Preterm Abortions TAB SAB Ect Mult Living                  Review of Systems  Constitutional: Positive for fever and chills.  HENT: Positive for ear pain, nosebleeds, congestion, sore throat, postnasal drip and sinus pressure.   Eyes: Negative.   Respiratory: Positive for cough. Negative for shortness of breath.   Cardiovascular: Negative.   Gastrointestinal: Negative.   Genitourinary: Negative.     Musculoskeletal: Negative.   Skin: Negative.   Neurological: Negative.   Psychiatric/Behavioral: Negative.     Allergies  Review of patient's allergies indicates no known allergies.  Home Medications   Current Outpatient Rx  Name Route Sig Dispense Refill  . ATENOLOL 50 MG PO TABS Oral Take 50 mg by mouth 2 (two) times daily.    Marland Kitchen ESOMEPRAZOLE MAGNESIUM 40 MG PO CPDR Oral Take 40 mg by mouth daily before breakfast.    . HYDROCODONE-ACETAMINOPHEN 7.5-750 MG PO TABS Oral Take 1 tablet by mouth daily as needed. For pain    . IMMUNE GLOBULIN (HUMAN) 10 GM/100ML IV SOLN Intravenous Inject 1 g/kg into the vein daily. 900 mg twice a month    . LISINOPRIL 20 MG PO TABS Oral Take 20 mg by mouth daily.    Marland Kitchen PREDNISONE 5 MG PO TABS Oral Take 15 mg by mouth daily.      BP 109/70  Pulse 61  Temp(Src) 97.3 F (36.3 C) (Oral)  SpO2 99%  Physical Exam  Nursing note and vitals reviewed. Constitutional: She is oriented to person, place, and time. She appears well-developed and well-nourished.  HENT:  Head: Normocephalic and atraumatic.  Right Ear: Tympanic membrane, external ear and ear canal normal.  Left Ear: Tympanic membrane, external ear and ear canal normal.  Nose: Rhinorrhea present. Right sinus exhibits maxillary sinus tenderness and frontal sinus tenderness. Left sinus exhibits maxillary sinus tenderness and frontal sinus tenderness.  Mouth/Throat: Uvula is midline, oropharynx is clear and moist and mucous membranes are normal.  Neck: Normal range of motion. Neck supple.  Cardiovascular: Normal rate, regular rhythm and normal heart sounds.   Pulmonary/Chest: Effort normal and breath sounds normal. No respiratory distress.  Abdominal: Soft. Bowel sounds are normal.  Musculoskeletal: Normal range of motion.  Lymphadenopathy:    She has no cervical adenopathy.  Neurological: She is alert and oriented to person, place, and time.  Skin: Skin is warm and dry.  Psychiatric: She has a  normal mood and affect.    ED Course  Procedures (including critical care time)  Pt with sinus pressure and congestion for over a week. Thick mucus and blood from the nose. Tender over the sinuses. VS normal here. Pt in NAD. Will start on decongestants, she is requesting antibiotics. Will add amoxicillin. Follow up with pcp.  No diagnosis found.    MDM          Lottie Mussel, PA 10/31/11 2352

## 2011-10-31 NOTE — ED Notes (Signed)
Pt has L ear pain and congestion. Pt also has some HA.

## 2011-11-01 NOTE — ED Provider Notes (Signed)
Medical screening examination/treatment/procedure(s) were performed by non-physician practitioner and as supervising physician I was immediately available for consultation/collaboration.   Hanley Seamen, MD 11/01/11 [phone]

## 2011-11-01 NOTE — ED Notes (Signed)
Pt complaining of congestion.

## 2011-11-02 ENCOUNTER — Ambulatory Visit: Payer: Medicare Other | Attending: Nurse Practitioner | Admitting: Physical Therapy

## 2011-11-02 DIAGNOSIS — M6281 Muscle weakness (generalized): Secondary | ICD-10-CM | POA: Insufficient documentation

## 2011-11-02 DIAGNOSIS — R269 Unspecified abnormalities of gait and mobility: Secondary | ICD-10-CM | POA: Insufficient documentation

## 2011-11-02 DIAGNOSIS — IMO0001 Reserved for inherently not codable concepts without codable children: Secondary | ICD-10-CM | POA: Insufficient documentation

## 2011-11-10 ENCOUNTER — Ambulatory Visit: Payer: Medicare Other | Admitting: Physical Therapy

## 2011-11-17 ENCOUNTER — Ambulatory Visit: Payer: Medicare Other | Admitting: Physical Therapy

## 2012-06-21 ENCOUNTER — Other Ambulatory Visit: Payer: Self-pay | Admitting: Neurosurgery

## 2012-06-21 DIAGNOSIS — IMO0001 Reserved for inherently not codable concepts without codable children: Secondary | ICD-10-CM

## 2012-07-03 ENCOUNTER — Ambulatory Visit
Admission: RE | Admit: 2012-07-03 | Discharge: 2012-07-03 | Disposition: A | Discharge status: Home / Self Care | Payer: Medicare Other | Source: Ambulatory Visit | Attending: Neurosurgery | Admitting: Neurosurgery

## 2012-07-03 DIAGNOSIS — IMO0001 Reserved for inherently not codable concepts without codable children: Secondary | ICD-10-CM

## 2013-10-06 ENCOUNTER — Other Ambulatory Visit: Payer: Self-pay | Admitting: Clinical Neurophysiology

## 2013-10-06 DIAGNOSIS — G950 Syringomyelia and syringobulbia: Secondary | ICD-10-CM

## 2013-10-11 ENCOUNTER — Other Ambulatory Visit: Payer: Medicare Other

## 2013-10-16 ENCOUNTER — Ambulatory Visit
Admission: RE | Admit: 2013-10-16 | Discharge: 2013-10-16 | Disposition: A | Discharge status: Home / Self Care | Payer: Medicare Other | Source: Ambulatory Visit | Attending: Clinical Neurophysiology | Admitting: Clinical Neurophysiology

## 2013-10-16 DIAGNOSIS — G950 Syringomyelia and syringobulbia: Secondary | ICD-10-CM

## 2014-01-29 ENCOUNTER — Encounter: Payer: Self-pay | Admitting: Internal Medicine

## 2014-01-29 ENCOUNTER — Ambulatory Visit (INDEPENDENT_AMBULATORY_CARE_PROVIDER_SITE_OTHER): Payer: Medicare Other | Admitting: Internal Medicine

## 2014-01-29 VITALS — BP 116/72 | HR 67 | Temp 97.9°F | Resp 12 | Ht 69.0 in | Wt 180.0 lb

## 2014-01-29 DIAGNOSIS — Z79899 Other long term (current) drug therapy: Secondary | ICD-10-CM

## 2014-01-29 DIAGNOSIS — M8580 Other specified disorders of bone density and structure, unspecified site: Secondary | ICD-10-CM

## 2014-01-29 DIAGNOSIS — E7800 Pure hypercholesterolemia, unspecified: Secondary | ICD-10-CM

## 2014-01-29 DIAGNOSIS — M949 Disorder of cartilage, unspecified: Secondary | ICD-10-CM

## 2014-01-29 DIAGNOSIS — M899 Disorder of bone, unspecified: Secondary | ICD-10-CM

## 2014-01-29 LAB — COMPREHENSIVE METABOLIC PANEL
ALT: 88 U/L — ABNORMAL HIGH (ref 0–35)
AST: 117 U/L — ABNORMAL HIGH (ref 0–37)
Albumin: 3.8 g/dL (ref 3.5–5.2)
Alkaline Phosphatase: 104 U/L (ref 39–117)
BUN: 11 mg/dL (ref 6–23)
CALCIUM: 8.7 mg/dL (ref 8.4–10.5)
CHLORIDE: 108 meq/L (ref 96–112)
CO2: 25 meq/L (ref 19–32)
CREATININE: 0.6 mg/dL (ref 0.4–1.2)
GFR: 154.27 mL/min (ref >60.00)
GLUCOSE: 90 mg/dL (ref 70–99)
Potassium: 3.6 mEq/L (ref 3.5–5.1)
Sodium: 138 mEq/L (ref 135–145)
Total Bilirubin: 0.2 mg/dL (ref 0.2–1.2)
Total Protein: 7.6 g/dL (ref 6.0–8.3)

## 2014-01-29 LAB — T3, FREE: T3 FREE: 2.9 pg/mL (ref 2.3–4.2)

## 2014-01-29 LAB — T4, FREE: Free T4: 0.73 ng/dL (ref 0.60–1.60)

## 2014-01-29 LAB — VITAMIN D 25 HYDROXY (VIT D DEFICIENCY, FRACTURES): VITD: 28.14 ng/mL

## 2014-01-29 LAB — TSH: TSH: 0.68 u[IU]/mL (ref 0.35–4.50)

## 2014-01-29 LAB — PHOSPHORUS: Phosphorus: 2.8 mg/dL (ref 2.3–4.6)

## 2014-01-29 LAB — MAGNESIUM: Magnesium: 2 mg/dL (ref 1.5–2.5)

## 2014-01-29 NOTE — Progress Notes (Signed)
Patient ID: [name], female   DOB: 11-24-1975, 38 y.o.   MRN: [id number]   HPI  [name] is a 38 y.o.-year-old female, referred by her neurologist: Dr Jeneen Rinks B. Caress, in consultation for low bone mass.  She had surgery for hydrocephalus 2/2 Chiari  Malformation, then dx with polymyositis >> started on steroids (high dose) >> now on Prednisone 15 mg daily for 6 mo >> managed by neurology at [facility]. She is on IVIG every other other month now. She initially could not walk, now walking with a cane >> most pain in hips and knees.  Pt was dx with low bone density in 12/2013. She denies any fractures or recent falls. No dizziness/vertigo/orthostasis.  I reviewed pt's DEXA scans: Date L1-L4 Z score LFN Z score Total Hip 33% distal Radius  01/21/2014 -1.5 -1.4 -2.8 n/a   No h/o hyper/hypocalcemia. No h/o hyperparathyroidism.   She has a h/o kidney stones (2 episodes: 03/2009 and 01/2011).  Lab Results  Component Value Date   CALCIUM 8.9 09/01/2010   CALCIUM 8.3 06/27/2010   CALCIUM 8.3* 05/19/2010   CALCIUM 8.3* 07/29/2009   CALCIUM 9.1 06/17/2009   CALCIUM 9.5 03/04/2009   CALCIUM 8.5 04/07/2008   CALCIUM 9.1 01/09/2008   CALCIUM 8.5 11/22/2007   CALCIUM 8.3* 11/21/2007   No h/o thyrotoxicosis. Reviewed TSH recent levels:  Lab Results  Component Value Date   TSH 3.439 11/18/2007   No h/o vitamin D deficiency. No levels available for review.  No h/o CKD. Last BUN/Cr: Lab Results  Component Value Date   BUN 8 09/01/2010   CREATININE 0.62 09/01/2010   Pt is on calcium supplement: 2x500 chewables in am. She also eats dairy: yoghurt, cheese, and green, leafy, vegetables.   She does weight bearing exercises once a week, OTW swimming.  She does not take high vitamin A doses.  Pt does not have a FH of osteoporosis. + FH of kyphosis in GM.   Has normal menses.   ROS: Constitutional: no weight gain/loss, no fatigue, no subjective hyperthermia/hypothermia, + poor sleep, +  nocturia Eyes: no blurry vision, no xerophthalmia ENT: no sore throat, no nodules palpated in throat, no dysphagia/odynophagia, no hoarseness Cardiovascular: no CP/SOB/palpitations/leg swelling Respiratory: no cough/SOB Gastrointestinal: no N/V/D/C Musculoskeletal: + muscle/+ joint aches Skin: no rashes Neurological: no tremors/numbness/tingling/dizziness Psychiatric: no depression/anxiety  No past medical history on file. Past Surgical History  Procedure Laterality Date  . Brain surgery     Polymyositis  History   Social History  . Marital Status: Single    Spouse Name: N/A    Number of Children: 1   Occupational History  . disabled   Social History Main Topics  . Smoking status: Never Smoker   . Smokeless tobacco: No  . Alcohol Use: No  . Drug Use: No   Current Outpatient Prescriptions on File Prior to Visit  Medication Sig Dispense Refill  . esomeprazole (NEXIUM) 40 MG capsule Take 40 mg by mouth daily before breakfast.      . HYDROcodone-acetaminophen (VICODIN ES) 7.5-750 MG per tablet Take 1 tablet by mouth daily as needed. For pain      . Immune Globulin, Human, 10 gm SOLN Inject 1 g/kg into the vein daily. 900 mg twice a month      . lisinopril (PRINIVIL,ZESTRIL) 20 MG tablet Take 20 mg by mouth daily.      . predniSONE (DELTASONE) 5 MG tablet Take 15 mg by mouth daily.      Marland Kitchen  atenolol (TENORMIN) 50 MG tablet Take 50 mg by mouth as needed.        No current facility-administered medications on file prior to visit.   No Known Allergies No family history on file.  PE: BP 116/72  Pulse 67  Temp(Src) 97.9 F (36.6 C) (Oral)  Resp 12  Ht 5' 9"  (1.753 m)  Wt 180 lb (81.647 kg)  BMI 26.57 kg/m2  SpO2 99% Wt Readings from Last 3 Encounters:  01/29/14 180 lb (81.647 kg)  05/19/10 199 lb 4.8 oz (90.402 kg)  07/29/09 205 lb 11.2 oz (93.305 kg)   Constitutional: overweight, in NAD. No kyphosis. Walks with a cane Eyes: PERRLA, EOMI, no exophthalmos ENT:  moist mucous membranes, no thyromegaly, no cervical lymphadenopathy Cardiovascular: RRR, No MRG Respiratory: CTA B Gastrointestinal: abdomen soft, NT, ND, BS+ Musculoskeletal: no deformities, strength not tested Skin: moist, warm, no rashes Neurological: no tremor with outstretched hands, DTR normal in all 4  Assessment: 1. Low bone mass  Plan: 1. Low bone mass - likely 2/2 polymyositis >> her left hip is the weakest and this site is where her Z score is the lowest. I do not consider her low bone mass be due to steroid use, because usually steroid induced osteoporosis is more pronounced at the spine. It is unclear whether she has vitamin D deficiency or other possible culprit for her low bone mass, but we will investigate this further by taking the following: Orders Placed This Encounter  Procedures  . TSH  . T4, free  . T3, free  . PTH, intact (no Ca)  . Comp Met (CMET)  . Vitamin D (25 hydroxy)  . Phosphorus  . Magnesium   - Discussed about increased risk of fracture, and to avoid falls  - We reviewed her DEXA scans together, and I explained the meaning of the Z scores, and the fact that they are lower at the hip, and we discussed about ways to improve her muscle strength in the hips as a mean to improve her bone density at this site - we reviewed her dietary and supplemental calcium intake. She is taking 1000 mg of calcium daily, however she takes this all at the same time which would cause decreased absorption, therefore I advised her to take 500 mg twice a day with meals - given handout from [place] Re: weight bearing exercises - advised to do this every day or at least 5/7 days. We discussed how we can apply the recommendations specifically to her self. - we discussed the fact that I would postpone starting anti-resorptive treatment until we make sure that we correct other determining factors, like vitamin D deficiency or other possible secondary causes for  osteoporosis. Also, I believe that increasing hip weight bearing exercises would really help with her hip BMD. - will see pt back in a year, but we discussed to get another DEXA scan in 2 years   Office Visit on 01/29/2014  Component Date Value Ref Range Status  . TSH 01/29/2014 0.68  0.35 - 4.50 uIU/mL Final  . Free T4 01/29/2014 0.73  0.60 - 1.60 ng/dL Final  . T3, Free 01/29/2014 2.9  2.3 - 4.2 pg/mL Final  . PTH 01/29/2014 26  15 - 65 pg/mL Final  . Sodium 01/29/2014 138  135 - 145 mEq/L Final  . Potassium 01/29/2014 3.6  3.5 - 5.1 mEq/L Final  . Chloride 01/29/2014 108  96 - 112 mEq/L Final  . CO2 01/29/2014 25  19 - 32 mEq/L Final  . Glucose, Bld 01/29/2014 90  70 - 99 mg/dL Final  . BUN 01/29/2014 11  6 - 23 mg/dL Final  . Creatinine, Ser 01/29/2014 0.6  0.4 - 1.2 mg/dL Final  . Total Bilirubin 01/29/2014 0.2  0.2 - 1.2 mg/dL Final  . Alkaline Phosphatase 01/29/2014 104  39 - 117 U/L Final  . AST 01/29/2014 117* 0 - 37 U/L Final  . ALT 01/29/2014 88* 0 - 35 U/L Final  . Total Protein 01/29/2014 7.6  6.0 - 8.3 g/dL Final  . Albumin 01/29/2014 3.8  3.5 - 5.2 g/dL Final  . Calcium 01/29/2014 8.7  8.4 - 10.5 mg/dL Final  . GFR 01/29/2014 154.27  >60.00 mL/min Final  . VITD 01/29/2014 28.14   Final   Deficient = <20Insufficient = 20 to <30Sufficient = 30 -100Upper Safety Limit = >100  . Phosphorus 01/29/2014 2.8  2.3 - 4.6 mg/dL Final  . Magnesium 01/29/2014 2.0  1.5 - 2.5 mg/dL Final   Thyroid tests normal.  PTH and calcium normal.  Mg and Phos normal.  Vit D a little low >> start Cholecalciferol (OTC vit D) 2000 IU daily >> follow up with PCP >> will need a vit D level in the near future. If she prefers to come here for this, I would be happy to order. Will ask pt. Target vit D >30.  Also, her LFTs are high >> needs appt with PCP to check for causes. Will let pt know.

## 2014-01-29 NOTE — Patient Instructions (Signed)
Please take calcium 500 mg 2x a day, with meals. Please try to join MyChart for easier communication. I will send you the labs through there. Please start weight bearing exercises for both shoulders and hips. Please come back for another visit in 1 year.  Exercise for Strong Bones (from [place]) There are two types of exercises that are important for building and maintaining bone density:  weight-bearing and muscle-strengthening exercises. Weight-bearing Exercises These exercises include activities that make you move against gravity while staying upright. Weight-bearing exercises can be high-impact or low-impact. High-impact weight-bearing exercises help build bones and keep them strong. If you have broken a bone due to osteoporosis or are at risk of breaking a bone, you may need to avoid high-impact exercises. If you're not sure, you should check with your healthcare provider. Examples of high-impact weight-bearing exercises are:   Dancing   Doing high-impact aerobics   Hiking   Jogging/running   Jumping Rope   Stair climbing   Tennis Low-impact weight-bearing exercises can also help keep bones strong and are a safe alternative if you cannot do high-impact exercises. Examples of low-impact weight-bearing exercises are:   Using elliptical training machines   Doing low-impact aerobics   Using stair-step machines   Fast walking on a treadmill or outside Muscle-Strengthening Exercises These exercises include activities where you move your body, a weight or some other resistance against gravity. They are also known as resistance exercises and include:   Lifting weights   Using elastic exercise bands   Using weight machines   Lifting your own body weight   Functional movements, such as standing and rising up on your toes Yoga and Pilates can also improve strength, balance and flexibility. However, certain positions may not be safe for people with osteoporosis or  those at increased risk of broken bones. For example, exercises that have you bend forward may increase the chance of breaking a bone in the spine. A physical therapist should be able to help you learn which exercises are safe and appropriate for you. Non-Impact Exercises Non-impact exercises can help you to improve balance, posture and how well you move in everyday activities. These exercises can also help to increase muscle strength and decrease the risk of falls and broken bones. Some of these exercises include:   Balance exercises that strengthen your legs and test your balance, such as Tai Chi, can decrease your risk of falls.   Posture exercises that improve your posture and reduce rounded or "sloping" shoulders can help you decrease the chance of breaking a bone, especially in the spine.   Functional exercises that improve how well you move can help you with everyday activities and decrease your chance of falling and breaking a bone. For example, if you have trouble getting up from a chair or climbing stairs, you should do these activities as exercises. A physical therapist can teach you balance, posture and functional exercises. Starting a New Exercise Program If you haven't exercised regularly for a while, check with your healthcare provider before beginning a new exercise program-particularly if you have health problems such as heart disease, diabetes or high blood pressure. If you're at high risk of breaking a bone, you should work with a physical therapist to develop a safe exercise program. Once you have your healthcare provider's approval, start slowly. If you've already broken bones in the spine because of osteoporosis, be very careful to avoid activities that require reaching down, bending forward, rapid twisting motions, heavy lifting  and those that increase your chance of a fall. As you get started, your muscles may feel sore for a day or two after you exercise. If soreness lasts longer,  you may be working too hard and need to ease up. Exercises should be done in a pain-free range of motion. How Much Exercise Do You Need? Weight-bearing exercises 30 minutes on most days of the week. Do a 30-minutesession or multiple sessions spread out throughout the day. The benefits to your bones are the same.   Muscle-strengthening exercises Two to three days per week. If you don't have much time for strengthening/resistance training, do small amounts at a time. You can do just one body part each day. For example do arms one day, legs the next and trunk the next. You can also spread these exercises out during your normal day.  Balance, posture and functional exercises Every day or as often as needed. You may want to focus on one area more than the others. If you have fallen or lose your balance, spend time doing balance exercises. If you are getting rounded shoulders, work more on posture exercises. If you have trouble climbing stairs or getting up from the couch, do more functional exercises. You can also perform these exercises at one time or spread them during your day. Work with a phyiscal therapist to learn the right exercises for you.

## 2014-01-30 ENCOUNTER — Ambulatory Visit: Payer: Medicare Other | Admitting: Internal Medicine

## 2014-01-30 ENCOUNTER — Encounter: Payer: Self-pay | Admitting: *Deleted

## 2014-01-30 ENCOUNTER — Encounter: Payer: Self-pay | Admitting: Internal Medicine

## 2014-01-30 DIAGNOSIS — M810 Age-related osteoporosis without current pathological fracture: Secondary | ICD-10-CM | POA: Insufficient documentation

## 2014-01-30 LAB — PARATHYROID HORMONE, INTACT (NO CA): PTH: 26 pg/mL (ref 15–65)

## 2014-02-06 ENCOUNTER — Ambulatory Visit: Payer: Medicare Other | Admitting: Internal Medicine

## 2014-04-01 ENCOUNTER — Other Ambulatory Visit (HOSPITAL_COMMUNITY): Payer: Self-pay | Admitting: Internal Medicine

## 2014-04-01 ENCOUNTER — Ambulatory Visit (HOSPITAL_COMMUNITY)
Admission: RE | Admit: 2014-04-01 | Discharge: 2014-04-01 | Disposition: A | Discharge status: Home / Self Care | Payer: Medicare Other | Source: Ambulatory Visit | Attending: Internal Medicine | Admitting: Internal Medicine

## 2014-04-01 DIAGNOSIS — M25569 Pain in unspecified knee: Secondary | ICD-10-CM | POA: Diagnosis present

## 2014-04-01 DIAGNOSIS — M25559 Pain in unspecified hip: Secondary | ICD-10-CM

## 2014-05-12 ENCOUNTER — Other Ambulatory Visit (HOSPITAL_COMMUNITY): Payer: Self-pay | Admitting: *Deleted

## 2014-05-13 ENCOUNTER — Encounter (HOSPITAL_COMMUNITY)
Admission: RE | Admit: 2014-05-13 | Discharge: 2014-05-13 | Disposition: A | Discharge status: Home / Self Care | Payer: Medicare Other | Source: Ambulatory Visit | Attending: Clinical Neurophysiology | Admitting: Clinical Neurophysiology

## 2014-05-13 DIAGNOSIS — M3320 Polymyositis, organ involvement unspecified: Secondary | ICD-10-CM | POA: Insufficient documentation

## 2014-05-13 MED ORDER — HEPARIN SOD (PORK) LOCK FLUSH 100 UNIT/ML IV SOLN
500.0000 [IU] | INTRAVENOUS | Status: AC | PRN
  Administered 2014-05-13: 500 [IU]

## 2014-05-13 MED ORDER — HEPARIN SOD (PORK) LOCK FLUSH 100 UNIT/ML IV SOLN
INTRAVENOUS | Status: AC
  Administered 2014-05-13: 500 [IU]
  Filled 2014-05-13: qty 5

## 2014-07-15 ENCOUNTER — Inpatient Hospital Stay (HOSPITAL_COMMUNITY): Admission: RE | Admit: 2014-07-15 | Payer: Medicare Other | Source: Ambulatory Visit

## 2014-07-28 ENCOUNTER — Ambulatory Visit: Payer: Medicare Other

## 2014-08-04 ENCOUNTER — Ambulatory Visit: Payer: Medicare Other | Attending: Clinical Neurophysiology

## 2014-08-04 ENCOUNTER — Telehealth: Payer: Self-pay | Admitting: *Deleted

## 2014-08-04 DIAGNOSIS — R29898 Other symptoms and signs involving the musculoskeletal system: Secondary | ICD-10-CM | POA: Insufficient documentation

## 2014-08-04 DIAGNOSIS — M25551 Pain in right hip: Secondary | ICD-10-CM | POA: Insufficient documentation

## 2014-08-04 DIAGNOSIS — M25569 Pain in unspecified knee: Secondary | ICD-10-CM | POA: Insufficient documentation

## 2014-08-04 DIAGNOSIS — M25552 Pain in left hip: Secondary | ICD-10-CM

## 2014-08-04 NOTE — Telephone Encounter (Signed)
APPTS MADE AND PRINTED.Marland Kitchen.08/04/2014

## 2014-08-04 NOTE — Therapy (Signed)
[facility] [address] [place], [place], [address] Phone: [phone]   Fax:  [phone]  Physical Therapy Evaluation  Patient Details  Name: [name] MRN: [id number] Date of Birth: 03/01/1976  Encounter Date: 08/04/2014      PT End of Session - 08/04/14 1027    Visit Number 1   Number of Visits 12   Date for PT Re-Evaluation 09/13/14   PT Start Time 0940   PT Stop Time 1015   PT Time Calculation (min) 35 min   Activity Tolerance Patient tolerated treatment well   Behavior [facility] for tasks assessed/performed      No past medical history on file.  Past Surgical History  Procedure Laterality Date  . Brain surgery      There were no vitals taken for this visit.  Visit Diagnosis:  Weakness of both lower limbs - Plan: PT plan of care cert/re-cert  Pain in joint, pelvic region and thigh, left - Plan: PT plan of care cert/re-cert  Pain in joint, pelvic region and thigh, right - Plan: PT plan of care cert/re-cert  Pain in joint, lower leg, unspecified laterality - Plan: PT plan of care cert/re-cert      Subjective Assessment - 08/04/14 0947    Symptoms Hip and knee stronger as she has been having pain in hips and knees   Pertinent History She report surgery for Chiari Malformation March of 2009. she has had PT in past. she was also diagnosed with polymyositis. she has had leg weakness since surgery    Limitations Standing;Walking;Lifting;House hold activities   How long can you sit comfortably? As Needed   How long can you stand comfortably? 20 minutes   How long can you walk comfortably? 10 min   Currently in Pain? Yes   Pain Score 3    Pain Location Hip  LT knee   Pain Orientation Left;Right   Pain Descriptors / Indicators Aching;Sharp   Pain Type Chronic pain   Pain Onset More than a month ago   Pain Frequency Intermittent   Aggravating Factors  walking and standing , lying on hips   Pain Relieving  Factors Lying down, medicaitions, injections for inflammation, IV meds   Effect of Pain on Daily Activities Limited due to pain and fatigue   Multiple Pain Sites No          [facility] PT Assessment - 08/04/14 0954    Assessment   Medical Diagnosis Bilateral hip and knee pan   Onset Date --  years ago about 2012   Next MD Visit MAy 2016   Prior Therapy 2 years ago   Precautions   Precautions None   Restrictions   Weight Bearing Restrictions No   Balance Screen   Has the patient fallen in the past 6 months Yes   How many times? 1   Has the patient had a decrease in activity level because of a fear of falling?  No   Is the patient reluctant to leave their home because of a fear of falling?  No   Home Environment   Living Enviornment Private residence   Living Arrangements Children   Prior Function   Level of Independence Requires assistive device for independence   Observation/Other Assessments   Other Surveys  --  LEFS      AROM   Right Hip Extension --  3-/5   Right Hip Flexion --  4/5   Right Hip External Rotation  --  2+/5   Right Hip Internal Rotation  --  3/5   Right Hip ABduction --  2+/5   Right Hip ADduction --  2+/5   Left Hip Extension --  3-/5   Left Hip Flexion --  4/5   Left Hip External Rotation  --  2+/5   Left Hip Internal Rotation  --  3/5   Left Hip ABduction --  2+/5   Left Hip ADduction --  3+/5   Right Knee Extension --  3-/5   Right Knee Flexion --  4/5   Left Knee Extension --  3-/5   Left Knee Flexion --  4/5   PROM   Overall PROM Comments LE range WNl bilaterally   Strength   Overall Strength Deficits   Ambulation/Gait   Ambulation/Gait --  walks with SPC, decreased stability in hips            PT Education - 08/04/14 1027    Education provided No          PT Short Term Goals - 08/04/14 1035    PT SHORT TERM GOAL #1   Title Indepoendent with inital HEP   Period Weeks   Status New   PT SHORT TERM GOAL #2    Title She will report pain decr 25% or more in both hips   Time 3   Period Weeks   Status New          PT Long Term Goals - 08/04/14 1036    PT LONG TERM GOAL #1   Title She will be independent in HEP issued as of last visit   Time 6   Period Weeks   Status New   PT LONG TERM GOAL #2   Title She will report pain decr in pain 50 % or more in hips and knees   Time 6   Period Weeks   Status New   PT LONG TERM GOAL #3   Title She will report able to walk for 15 minutes or more with mild pain   Time 6   Period Weeks   Status New   PT LONG TERM GOAL #4   Title She will be able to stand for 20 minutes with mild pain   Time 6   Period Weeks   Status New          Plan - 08/04/14 1027    Clinical Impression Statement She is very weak in hips and this is of  long duration so improvement in strength is uncertain. We will work on a HEP and address pain .    Pt will benefit from skilled therapeutic intervention in order to improve on the following deficits Abnormal gait;Difficulty walking;Pain;Decreased strength;Decreased activity tolerance   Rehab Potential Good   PT Frequency 2x / week   PT Duration 6 weeks   PT Treatment/Interventions Patient/family education;Manual techniques;Dry needling;Cryotherapy;Moist Heat;Ultrasound;Therapeutic exercise   PT Next Visit Plan Strengthening execises and modalities/Manual treatment   PT Home Exercise Plan  strength, core strength   Consulted and Agree with Plan of Care Patient     By signing I understand that I am ordering/authorizing the use of Iontophoresis using 4 mg/mL of dexamethasone as a component of this plan of care.     G-Codes - 08/04/14 1117    Functional Assessment Tool Used LEFS   Functional Limitation Mobility: Walking and moving around   Mobility: Walking and Moving Around Current Status ([id number]) At least 40 percent but less than  60 percent impaired, limited or restricted   Mobility: Walking and Moving Around Goal Status  [phone]) At least 20 percent but less than 40 percent impaired, limited or restricted                            Problem List Patient Active Problem List   Diagnosis Date Noted  . Low bone mass 01/30/2014  . COMMON VARIABLE IMMUNODEFICIENCY 05/25/2010  . INFECTION, SKIN AND SOFT TISSUE 05/19/2010  . CONJUNCTIVITIS, ALLERGIC 07/29/2009  . SKIN RASH 06/17/2009  . TINEA VERSICOLOR 04/14/2009  . NASOPHARYNGITIS 04/14/2009  . DERMATOMYOSITIS 02/25/2009  . CANDIDIASIS 02/19/2009  . INGROWN NAIL 02/01/2009  . HYPERCHOLESTEROLEMIA 01/28/2009  . RENAL CALCULUS, RIGHT 06/18/2008  . HYPERTENSION, BENIGN ESSENTIAL 06/16/2008  . POLYMYOSITIS 06/16/2008  . INSOMNIA 02/19/2008  . DERMATOMYCOSIS 01/08/2008  . 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 WEAKNESS, LEFT SIDE OF BODY 11/18/2007  . DISC DISEASE, LUMBAR 11/18/2007  . MEMORY LOSS 11/18/2007  . BACK PAIN 11/11/2007  . ARM NUMBNESS 11/11/2007    Caprice Red PT 08/04/2014, 11:20 AM

## 2014-08-10 ENCOUNTER — Ambulatory Visit: Payer: Medicare Other | Admitting: Physical Therapy

## 2014-08-12 ENCOUNTER — Ambulatory Visit: Payer: Medicare Other | Admitting: Rehabilitation

## 2014-08-12 DIAGNOSIS — M25552 Pain in left hip: Secondary | ICD-10-CM

## 2014-08-12 DIAGNOSIS — M25569 Pain in unspecified knee: Secondary | ICD-10-CM

## 2014-08-12 DIAGNOSIS — R29898 Other symptoms and signs involving the musculoskeletal system: Secondary | ICD-10-CM | POA: Diagnosis not present

## 2014-08-12 DIAGNOSIS — M25551 Pain in right hip: Secondary | ICD-10-CM

## 2014-08-12 NOTE — Patient Instructions (Addendum)
   Copyright  VHI. All rights reserved.  Clam   Lie on side, legs bent 90. Open top knee to ceiling, rotating leg outward. Touch toes to ankle of bottom leg. Close knees, rotating leg inward. Maintain hip position. Repeat _15___ times each side. Repeat on other side. Do ___2_ sessions per day.  Copyright  VHI. All rights reserved.  Bridge  HOLD BALL BETWEEN KNEES Lie back, legs bent. Inhale, pressing hips up. Keeping ribs in, lengthen lower back. Exhale, rolling down along spine from top. Repeat __15__ times. Do ___2_ sessions per day.  Copyright  VHI. All rights reserved.    PELVIC TILT  Lie on back, legs bent. Exhale, tilting top of pelvis back, pubic bone up, to flatten lower back. Inhale, rolling pelvis opposite way, top forward, pubic bone down, arch in back. Repeat __10__ times. Do __2__ sessions per day. Copyright  VHI. All rights reserved.    Isometric Hold With Pelvic Floor (Hook-Lying)  Lie with hips and knees bent. Slowly inhale, and then exhale. Pull navel toward spine and tighten pelvic floor. Hold for __10_ seconds. Continue to breathe in and out during hold. Rest for _10__ seconds. Repeat __10_ times. Do __2-3_ times a day.   Knee Fold  Lie on back, legs bent, arms by sides. Exhale, lifting knee to chest. Inhale, returning. Keep abdominals flat, navel to spine. Repeat __10__ times, alternating legs. Do __2__ sessions per day.  Knee Drop  Keep pelvis stable. Without rotating hips, slowly drop knee to side, pause, return to center, bring knee across midline toward opposite hip. Feel obliques engaging. Repeat for ___10_ times each leg.   Copyright  VHI. All rights reserved.       .Marland Kitchen

## 2014-08-12 NOTE — Therapy (Addendum)
Outpatient Rehabilitation [facility] [address] [place], [place], [address] Phone: [phone]   Fax:  [phone]  Physical Therapy Treatment  Patient Details  Name: [name] MRN: [id number] Date of Birth: Sep 27, 1975  Encounter Date: 08/12/2014      PT End of Session - 08/12/14 0940    Visit Number 2   Number of Visits 12   Date for PT Re-Evaluation 09/13/14   PT Start Time 0935   PT Stop Time 1015   PT Time Calculation (min) 40 min      No past medical history on file.  Past Surgical History  Procedure Laterality Date  . Brain surgery      There were no vitals taken for this visit.  Visit Diagnosis:  Weakness of both lower limbs  Pain in joint, pelvic region and thigh, left  Pain in joint, pelvic region and thigh, right  Pain in joint, lower leg, unspecified laterality      Subjective Assessment - 08/12/14 0939    Symptoms Left > Right hip pain today   Pain Score 3    Pain Location Hip   Pain Orientation Left   Pain Descriptors / Indicators Aching   Pain Type Chronic pain   Aggravating Factors  walking and standing, lying on hips   Pain Relieving Factors meds   Multiple Pain Sites No            [facility] Adult PT Treatment/Exercise - 08/12/14 0950    Lumbar Exercises: Supine   Ab Set 10 reps   Clam 10 reps   Bent Knee Raise 10 reps   Knee/Hip Exercises: Aerobic   Stationary Bike Nustep LE only x 5 minutes level 4    Knee/Hip Exercises: Supine   Bridges 15 reps  with ball squeeze   Knee/Hip Exercises: Sidelying   Clams 15 each   Other Sidelying Knee Exercises IR AROm x 15 each    Knee/Hip Exercises: Prone   Other Prone Exercises Heel squeeze x15, AROM ER/IR x 15          PT Education - 08/12/14 1013    Education provided Yes   Education Details HEP   Person(s) Educated Patient   Methods Handout   Comprehension Verbalized understanding          PT Short Term Goals - 08/12/14 0941    PT SHORT TERM GOAL #1   Title Indepoendent with inital HEP   Time 3   Period Weeks   Status On-going   PT SHORT TERM GOAL #2   Title She will report pain decr 25% or more in both hips   Time 3   Period Weeks   Status On-going          PT Long Term Goals - 08/12/14 0941    PT LONG TERM GOAL #1   Title She will be independent in HEP issued as of last visit   Time 6   Period Weeks   Status On-going   PT LONG TERM GOAL #2   Title She will report pain decr in pain 50 % or more in hips and knees   Time 6   Period Weeks   Status On-going   PT LONG TERM GOAL #3   Title She will report able to walk for 15 minutes or more with mild pain   Time 6   Period Weeks   Status On-going   PT LONG TERM GOAL #4   Title She will be able to  stand for 20 minutes with mild pain   Time 6   Period Weeks   Status On-going          Plan - 08/12/14 0941    Clinical Impression Statement able to initiate home exercise plan for strengthening hips and core   PT Next Visit Plan review HEP, progress, try standing hip therex       Problem List Patient Active Problem List   Diagnosis Date Noted  . Low bone mass 01/30/2014  . COMMON VARIABLE IMMUNODEFICIENCY 05/25/2010  . INFECTION, SKIN AND SOFT TISSUE 05/19/2010  . CONJUNCTIVITIS, ALLERGIC 07/29/2009  . SKIN RASH 06/17/2009  . TINEA VERSICOLOR 04/14/2009  . NASOPHARYNGITIS 04/14/2009  . DERMATOMYOSITIS 02/25/2009  . CANDIDIASIS 02/19/2009  . INGROWN NAIL 02/01/2009  . HYPERCHOLESTEROLEMIA 01/28/2009  . RENAL CALCULUS, RIGHT 06/18/2008  . HYPERTENSION, BENIGN ESSENTIAL 06/16/2008  . POLYMYOSITIS 06/16/2008  . INSOMNIA 02/19/2008  . DERMATOMYCOSIS 01/08/2008  . 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 WEAKNESS, LEFT SIDE OF BODY 11/18/2007  . [place] DISEASE, LUMBAR 11/18/2007  . MEMORY LOSS 11/18/2007  . BACK PAIN 11/11/2007  . ARM NUMBNESS 11/11/2007    Dorene Ar, PTA 08/12/2014, 10:14 AM     PHYSICAL THERAPY DISCHARGE SUMMARY  Visits from  Start of Care: 2  Current functional level related to goals / functional outcomes: Unknown as she did not return after the second visit   Remaining deficits: Unknown   Education / Equipment: NA  Plan:                                                    Patient goals were not met. Patient is being discharged due to not returning since the last visit.  ?????    Darrel Hoover, PT           10/27/14    3:42 PM

## 2014-08-17 ENCOUNTER — Encounter: Payer: Medicare Other | Admitting: Physical Therapy

## 2014-08-19 ENCOUNTER — Ambulatory Visit: Payer: Medicare Other

## 2014-09-14 ENCOUNTER — Other Ambulatory Visit (HOSPITAL_COMMUNITY): Payer: Self-pay | Admitting: *Deleted

## 2014-09-15 ENCOUNTER — Encounter (HOSPITAL_COMMUNITY): Payer: Medicare Other

## 2014-09-24 ENCOUNTER — Ambulatory Visit (HOSPITAL_COMMUNITY)
Admission: RE | Admit: 2014-09-24 | Discharge: 2014-09-24 | Disposition: A | Discharge status: Home / Self Care | Payer: Medicare Other | Source: Ambulatory Visit | Attending: Clinical Neurophysiology | Admitting: Clinical Neurophysiology

## 2014-09-24 DIAGNOSIS — Z452 Encounter for adjustment and management of vascular access device: Secondary | ICD-10-CM | POA: Insufficient documentation

## 2014-09-24 MED ORDER — HEPARIN SOD (PORK) LOCK FLUSH 100 UNIT/ML IV SOLN
INTRAVENOUS | Status: AC
  Filled 2014-09-24: qty 5

## 2014-09-24 MED ORDER — HEPARIN SOD (PORK) LOCK FLUSH 100 UNIT/ML IV SOLN: 500.0000 [IU] | INTRAVENOUS | Status: DC

## 2014-09-24 MED ORDER — HEPARIN SOD (PORK) LOCK FLUSH 100 UNIT/ML IV SOLN
500.0000 [IU] | INTRAVENOUS | Status: DC | PRN
  Administered 2014-09-24: 500 [IU]

## 2014-11-10 ENCOUNTER — Encounter (HOSPITAL_COMMUNITY)
Admission: RE | Admit: 2014-11-10 | Discharge: 2014-11-10 | Disposition: A | Discharge status: Home / Self Care | Payer: Medicare Other | Source: Ambulatory Visit | Attending: Clinical Neurophysiology | Admitting: Clinical Neurophysiology

## 2014-11-10 DIAGNOSIS — Z452 Encounter for adjustment and management of vascular access device: Secondary | ICD-10-CM | POA: Insufficient documentation

## 2014-11-10 MED ORDER — HEPARIN SOD (PORK) LOCK FLUSH 100 UNIT/ML IV SOLN
500.0000 [IU] | INTRAVENOUS | Status: DC
  Administered 2014-11-10: 500 [IU]

## 2014-11-10 MED ORDER — HEPARIN SOD (PORK) LOCK FLUSH 100 UNIT/ML IV SOLN: 500.0000 [IU] | INTRAVENOUS | Status: DC | PRN

## 2014-11-11 MED ORDER — HEPARIN SOD (PORK) LOCK FLUSH 100 UNIT/ML IV SOLN
INTRAVENOUS | Status: AC
  Filled 2014-11-11: qty 5

## 2014-12-30 ENCOUNTER — Emergency Department (HOSPITAL_COMMUNITY): Payer: Medicare Other

## 2014-12-30 ENCOUNTER — Emergency Department (HOSPITAL_COMMUNITY)
Admission: EM | Admit: 2014-12-30 | Discharge: 2014-12-30 | Disposition: A | Discharge status: Home / Self Care | Payer: Medicare Other | Attending: Emergency Medicine | Admitting: Emergency Medicine

## 2014-12-30 ENCOUNTER — Encounter (HOSPITAL_COMMUNITY): Payer: Self-pay | Admitting: *Deleted

## 2014-12-30 DIAGNOSIS — Y9389 Activity, other specified: Secondary | ICD-10-CM | POA: Insufficient documentation

## 2014-12-30 DIAGNOSIS — Y998 Other external cause status: Secondary | ICD-10-CM | POA: Diagnosis not present

## 2014-12-30 DIAGNOSIS — S92251A Displaced fracture of navicular [scaphoid] of right foot, initial encounter for closed fracture: Secondary | ICD-10-CM | POA: Insufficient documentation

## 2014-12-30 DIAGNOSIS — Y9289 Other specified places as the place of occurrence of the external cause: Secondary | ICD-10-CM | POA: Diagnosis not present

## 2014-12-30 DIAGNOSIS — W010XXA Fall on same level from slipping, tripping and stumbling without subsequent striking against object, initial encounter: Secondary | ICD-10-CM | POA: Diagnosis not present

## 2014-12-30 DIAGNOSIS — I10 Essential (primary) hypertension: Secondary | ICD-10-CM | POA: Diagnosis not present

## 2014-12-30 DIAGNOSIS — Q831 Accessory breast: Secondary | ICD-10-CM | POA: Diagnosis not present

## 2014-12-30 DIAGNOSIS — S92001A Unspecified fracture of right calcaneus, initial encounter for closed fracture: Secondary | ICD-10-CM | POA: Diagnosis not present

## 2014-12-30 DIAGNOSIS — Z7952 Long term (current) use of systemic steroids: Secondary | ICD-10-CM | POA: Diagnosis not present

## 2014-12-30 DIAGNOSIS — Z79899 Other long term (current) drug therapy: Secondary | ICD-10-CM | POA: Insufficient documentation

## 2014-12-30 DIAGNOSIS — Z8679 Personal history of other diseases of the circulatory system: Secondary | ICD-10-CM

## 2014-12-30 DIAGNOSIS — S99911A Unspecified injury of right ankle, initial encounter: Secondary | ICD-10-CM | POA: Diagnosis present

## 2014-12-30 MED ORDER — HYDROCODONE-ACETAMINOPHEN 5-325 MG PO TABS
1.0000 | ORAL_TABLET | Freq: Once | ORAL | Status: AC
  Administered 2014-12-30: 1 via ORAL
  Filled 2014-12-30: qty 1

## 2014-12-30 NOTE — ED Notes (Signed)
Pt reports walking and her right leg "gave out" on her which she states is normal for her. Now has pain to right foot and lower leg.

## 2014-12-30 NOTE — Discharge Instructions (Signed)

## 2014-12-30 NOTE — ED Provider Notes (Signed)
CSN: [id number]     Arrival date & time 12/30/14  1246 History   First MD Initiated Contact with Patient 12/30/14 1302     Chief Complaint  Patient presents with  . Fall     (Consider location/radiation/quality/duration/timing/severity/associated sxs/prior Treatment) HPI  Pt is a 39yo female with hx of HTN and osteoporosis, presenting to ED with c/o sudden onset Right ankle and foot pain that started just PTA after twisting her Right ankle due to her Left leg "giving out"  Pt states her left leg "gives out" on her every now and then. Denies pain in left leg.  Pain in right foot and ankle is aching and sore, 7/10 at worst. No pain medication PTA.  Pt states pain is significantly worse with weight bearing. No previous surgery or fracture of Right ankle. No other injuries.    Past Medical History  Diagnosis Date  . Polymastia    Past Surgical History  Procedure Laterality Date  . Brain surgery     History reviewed. No pertinent family history. History  Substance Use Topics  . Smoking status: Never Smoker   . Smokeless tobacco: Not on file  . Alcohol Use: No   OB History    No data available     Review of Systems  Musculoskeletal: Positive for myalgias, arthralgias and gait problem. Negative for neck pain and neck stiffness.  Skin: Negative for color change and wound.  Neurological: Positive for weakness (Left leg gives out at times). Negative for numbness.  All other systems reviewed and are negative.     Allergies  Review of patient's allergies indicates no known allergies.  Home Medications   Prior to Admission medications   Medication Sig Start Date End Date Taking? Authorizing Provider  atenolol (TENORMIN) 50 MG tablet Take 50 mg by mouth as needed.     Historical Provider, MD  Calcium 500-100 MG-UNIT CHEW Chew 2 each by mouth daily. Vitafusion    Historical Provider, MD  esomeprazole (NEXIUM) 40 MG capsule Take 40 mg by mouth daily before breakfast.    Historical  Provider, MD  HYDROcodone-acetaminophen (VICODIN ES) 7.5-750 MG per tablet Take 1 tablet by mouth daily as needed. For pain    Historical Provider, MD  Immune Globulin, Human, 10 gm SOLN Inject 1 g/kg into the vein daily. 900 mg twice a month    Historical Provider, MD  lisinopril (PRINIVIL,ZESTRIL) 20 MG tablet Take 20 mg by mouth daily.    Historical Provider, MD  Methotrexate, PF, 25 MG/0.4ML SOAJ Inject 25 mg into the skin once a week.    Historical Provider, MD  Multiple Vitamins-Minerals (HM MULTIVITAMIN ADULT GUMMY PO) Take 2 each by mouth daily. Vitafusion    Historical Provider, MD  predniSONE (DELTASONE) 5 MG tablet Take 15 mg by mouth daily.    Historical Provider, MD   BP 153/97 mmHg  Pulse 75  Temp(Src) 98.2 F (36.8 C) (Oral)  Resp 20  Ht 5\' 9"  (1.753 m)  Wt 174 lb (78.926 kg)  BMI 25.68 kg/m2  SpO2 100%  LMP 12/04/2014 Physical Exam  Constitutional: She is oriented to person, place, and time. She appears well-developed and well-nourished.  HENT:  Head: Normocephalic and atraumatic.  Eyes: EOM are normal.  Neck: Normal range of motion.  Cardiovascular: Normal rate.   Pulses:      Dorsalis pedis pulses are 2+ on the right side.       Posterior tibial pulses are 2+ on the right side.  Pulmonary/Chest:  Effort normal.  Musculoskeletal: Normal range of motion. She exhibits edema and tenderness.  Right ankle and foot: no obvious deformity. Mild edema with tenderness to Lateral and dorsal aspect of foot and ankle.   No calf or knee tenderness. FROM Right knee and all toes of Right foot.  Neurological: She is alert and oriented to person, place, and time.  Skin: Skin is warm and dry.  Right ankle and foot: skin in tact. No ecchymosis or erythema.   Psychiatric: She has a normal mood and affect. Her behavior is normal.  Nursing note and vitals reviewed.   ED Course  Procedures (including critical care time) Labs Review Labs Reviewed - No data to display  Imaging  Review Dg Ankle Complete Right  12/30/2014   CLINICAL DATA:  Status post fall today with pain over the anterior aspect of the ankle.  EXAM: RIGHT ANKLE - COMPLETE 3+ VIEW  COMPARISON:  None.  FINDINGS: The ankle joint mortise is preserved. The talar dome is intact. There is no acute malleolar fracture. The talus and calcaneus are intact. There is a small spur from the proximal dorsal surface of the tarsal navicular. There is mild soft tissue swelling anteriorly.  IMPRESSION: There is mild soft tissue swelling anteriorly. There is no acute bony abnormality of the right ankle.   Electronically Signed   By: David  SwazilandJordan M.D.   On: 12/30/2014 13:43   Dg Foot Complete Right  12/30/2014   CLINICAL DATA:  Larey SeatFell today. Pain across the metatarsals of the right foot. Initial encounter.  EXAM: RIGHT FOOT COMPLETE - 3+ VIEW  COMPARISON:  None.  FINDINGS: There is a 5 mm osseous fragment projecting at the dorsal aspect of the navicular consistent with small avulsion fracture. There is also a 6 mm curvilinear calcification projecting at the lateral aspect of the calcaneus suggestive of a small avulsion fracture at the origin of extensor digitorum brevis. There is no dislocation. No lytic or blastic osseous lesion is seen. Mild soft tissue swelling is noted about the ankle and dorsum of the foot.  IMPRESSION: Small avulsion fractures of the dorsal navicular and calcaneus.   Electronically Signed   By: Sebastian AcheAllen  Grady   On: 12/30/2014 13:51     EKG Interpretation None      MDM   Final diagnoses:  Fall from slip, trip, or stumble, initial encounter  Avulsion fracture of navicular bone of right foot, closed, initial encounter  Avulsion fracture of calcaneus, right, closed, initial encounter  History of hypertension    Pt presenting to ED with sudden onset Right foot and ankle pain after trip and fall. No other injuries.  Skin is in tact.  Pulse is strong. Sensation in tact.  BP elevated in ED. Pt reports hx of HTN  but states her PCP is "weining her off" BP medication- lisinopril and atenolol. Pt states she was advised to take "only as needed" pt also believes her BP is elevated due to the pain and she is upset about being in the ED. Will get plain films of ankle and foot, give 1 norco and ice pack then reassess BP   Plain film: significant for small avulsion fracture of dorsal navicular and calcaneous  Will place in cam-walker boot and provider crutches for comfort. Pt states she has f/u with her PCP for a physical exam. Advised pt to keep that appointment but mention today's ED visit as well as elevated BP.  Also advised to call to schedule f/u appointment with  Dr. Lajoyce Corners, orthopedist, for recheck of symptoms in 1-2 weeks. Return precautions provided. Pt verbalized understanding and agreement with tx plan.  Per [place] Controlled Substance database, pt received a 30 day supply for hydrocodone-acetaminophen (7.5-325) on 12/14/14.  No additional narcotics prescribed today as pt should still have 14 days worth.   Junius Finner, PA-C 12/30/14 1415  Rolland Porter, MD 01/03/15 [phone]

## 2015-01-12 ENCOUNTER — Encounter (HOSPITAL_COMMUNITY): Payer: Medicare Other

## 2015-01-18 ENCOUNTER — Other Ambulatory Visit (HOSPITAL_COMMUNITY): Payer: Self-pay | Admitting: *Deleted

## 2015-01-19 ENCOUNTER — Encounter (HOSPITAL_COMMUNITY)
Admission: RE | Admit: 2015-01-19 | Discharge: 2015-01-19 | Disposition: A | Discharge status: Home / Self Care | Payer: Medicare Other | Source: Ambulatory Visit | Attending: Clinical Neurophysiology | Admitting: Clinical Neurophysiology

## 2015-01-19 DIAGNOSIS — Z452 Encounter for adjustment and management of vascular access device: Secondary | ICD-10-CM | POA: Insufficient documentation

## 2015-01-19 MED ORDER — HEPARIN SOD (PORK) LOCK FLUSH 100 UNIT/ML IV SOLN
INTRAVENOUS | Status: AC
  Administered 2015-01-19: 500 [IU]
  Filled 2015-01-19: qty 5

## 2015-01-19 MED ORDER — HEPARIN SOD (PORK) LOCK FLUSH 100 UNIT/ML IV SOLN: 500.0000 [IU] | Freq: Once | INTRAVENOUS | Status: DC

## 2015-02-02 ENCOUNTER — Ambulatory Visit: Payer: Medicare Other

## 2015-02-22 ENCOUNTER — Other Ambulatory Visit (HOSPITAL_COMMUNITY): Payer: Self-pay

## 2015-02-23 ENCOUNTER — Encounter (HOSPITAL_COMMUNITY)
Admission: RE | Admit: 2015-02-23 | Discharge: 2015-02-23 | Disposition: A | Discharge status: Home / Self Care | Payer: Medicare Other | Source: Ambulatory Visit | Attending: Clinical Neurophysiology | Admitting: Clinical Neurophysiology

## 2015-02-23 DIAGNOSIS — Z452 Encounter for adjustment and management of vascular access device: Secondary | ICD-10-CM | POA: Diagnosis present

## 2015-02-23 MED ORDER — HEPARIN SOD (PORK) LOCK FLUSH 100 UNIT/ML IV SOLN
INTRAVENOUS | Status: AC
  Administered 2015-02-23: 500 [IU]
  Filled 2015-02-23: qty 5

## 2015-02-23 MED ORDER — HEPARIN SOD (PORK) LOCK FLUSH 100 UNIT/ML IV SOLN
500.0000 [IU] | Freq: Once | INTRAVENOUS | Status: DC
Start: 2015-02-23 — End: 2015-02-24

## 2015-03-09 ENCOUNTER — Ambulatory Visit: Payer: Medicare Other | Admitting: Physical Therapy

## 2015-03-22 ENCOUNTER — Other Ambulatory Visit (HOSPITAL_COMMUNITY): Payer: Self-pay | Admitting: *Deleted

## 2015-03-23 ENCOUNTER — Inpatient Hospital Stay (HOSPITAL_COMMUNITY): Admission: RE | Admit: 2015-03-23 | Payer: Medicare Other | Source: Ambulatory Visit

## 2015-03-25 ENCOUNTER — Ambulatory Visit: Payer: Medicare Other | Admitting: Physical Therapy

## 2015-04-13 ENCOUNTER — Other Ambulatory Visit (HOSPITAL_COMMUNITY): Payer: Self-pay | Admitting: *Deleted

## 2015-04-14 ENCOUNTER — Encounter (HOSPITAL_COMMUNITY)
Admission: RE | Admit: 2015-04-14 | Discharge: 2015-04-14 | Disposition: A | Discharge status: Home / Self Care | Payer: Medicare Other | Source: Ambulatory Visit | Attending: Clinical Neurophysiology | Admitting: Clinical Neurophysiology

## 2015-04-14 DIAGNOSIS — Z452 Encounter for adjustment and management of vascular access device: Secondary | ICD-10-CM | POA: Diagnosis present

## 2015-04-14 MED ORDER — HEPARIN SOD (PORK) LOCK FLUSH 100 UNIT/ML IV SOLN
500.0000 [IU] | INTRAVENOUS | Status: DC
  Administered 2015-04-14: 500 [IU]

## 2015-04-14 MED ORDER — HEPARIN SOD (PORK) LOCK FLUSH 100 UNIT/ML IV SOLN
INTRAVENOUS | Status: AC
  Administered 2015-04-14: 500 [IU]
  Filled 2015-04-14: qty 5

## 2015-05-12 ENCOUNTER — Inpatient Hospital Stay (HOSPITAL_COMMUNITY): Admission: RE | Admit: 2015-05-12 | Payer: Medicare Other | Source: Ambulatory Visit

## 2015-05-17 ENCOUNTER — Other Ambulatory Visit (HOSPITAL_COMMUNITY): Payer: Self-pay

## 2015-05-18 ENCOUNTER — Encounter (HOSPITAL_COMMUNITY)
Admission: RE | Admit: 2015-05-18 | Discharge: 2015-05-18 | Disposition: A | Discharge status: Home / Self Care | Payer: Medicare Other | Source: Ambulatory Visit | Attending: Clinical Neurophysiology | Admitting: Clinical Neurophysiology

## 2015-05-18 DIAGNOSIS — Z452 Encounter for adjustment and management of vascular access device: Secondary | ICD-10-CM | POA: Diagnosis present

## 2015-05-18 MED ORDER — HEPARIN SOD (PORK) LOCK FLUSH 100 UNIT/ML IV SOLN
INTRAVENOUS | Status: AC
  Administered 2015-05-18: 500 [IU]
  Filled 2015-05-18: qty 5

## 2015-06-15 ENCOUNTER — Encounter (HOSPITAL_COMMUNITY)
Admission: RE | Admit: 2015-06-15 | Discharge: 2015-06-15 | Disposition: A | Discharge status: Home / Self Care | Payer: Medicare Other | Source: Ambulatory Visit | Attending: Clinical Neurophysiology | Admitting: Clinical Neurophysiology

## 2015-06-15 DIAGNOSIS — Z452 Encounter for adjustment and management of vascular access device: Secondary | ICD-10-CM | POA: Diagnosis not present

## 2015-06-15 MED ORDER — HEPARIN SOD (PORK) LOCK FLUSH 100 UNIT/ML IV SOLN
INTRAVENOUS | Status: AC
  Filled 2015-06-15: qty 5

## 2015-06-15 MED ORDER — HEPARIN SOD (PORK) LOCK FLUSH 100 UNIT/ML IV SOLN: 500.0000 [IU] | INTRAVENOUS | Status: DC | PRN

## 2015-06-15 NOTE — Progress Notes (Signed)
Left PAC accessed per protocol; good blood return; flushed per protocol; pt tolerated well

## 2015-07-19 ENCOUNTER — Other Ambulatory Visit (HOSPITAL_COMMUNITY): Payer: Self-pay | Admitting: *Deleted

## 2015-07-20 ENCOUNTER — Encounter (HOSPITAL_COMMUNITY)
Admission: RE | Admit: 2015-07-20 | Discharge: 2015-07-20 | Disposition: A | Discharge status: Home / Self Care | Payer: Medicare Other | Source: Ambulatory Visit | Attending: Clinical Neurophysiology | Admitting: Clinical Neurophysiology

## 2015-07-20 DIAGNOSIS — Z452 Encounter for adjustment and management of vascular access device: Secondary | ICD-10-CM | POA: Diagnosis present

## 2015-07-20 MED ORDER — HEPARIN SOD (PORK) LOCK FLUSH 100 UNIT/ML IV SOLN: 500.0000 [IU] | INTRAVENOUS | Status: DC | PRN

## 2015-07-20 MED ORDER — HEPARIN SOD (PORK) LOCK FLUSH 100 UNIT/ML IV SOLN
INTRAVENOUS | Status: AC
  Administered 2015-07-20: 500 [IU]
  Filled 2015-07-20: qty 5

## 2015-08-17 ENCOUNTER — Inpatient Hospital Stay (HOSPITAL_COMMUNITY)
Admission: RE | Admit: 2015-08-17 | Discharge: 2015-08-17 | Disposition: A | Discharge status: Home / Self Care | Payer: Medicare Other | Source: Ambulatory Visit | Attending: Clinical Neurophysiology | Admitting: Clinical Neurophysiology

## 2015-08-24 ENCOUNTER — Other Ambulatory Visit (HOSPITAL_COMMUNITY): Payer: Self-pay | Admitting: *Deleted

## 2015-08-25 ENCOUNTER — Inpatient Hospital Stay (HOSPITAL_COMMUNITY): Admission: RE | Admit: 2015-08-25 | Payer: Medicare Other | Source: Ambulatory Visit

## 2015-08-31 ENCOUNTER — Encounter (HOSPITAL_COMMUNITY)
Admission: RE | Admit: 2015-08-31 | Discharge: 2015-08-31 | Disposition: A | Discharge status: Home / Self Care | Payer: Medicare Other | Source: Ambulatory Visit | Attending: Clinical Neurophysiology | Admitting: Clinical Neurophysiology

## 2015-08-31 DIAGNOSIS — Z452 Encounter for adjustment and management of vascular access device: Secondary | ICD-10-CM | POA: Insufficient documentation

## 2015-08-31 MED ORDER — HEPARIN SOD (PORK) LOCK FLUSH 100 UNIT/ML IV SOLN
500.0000 [IU] | INTRAVENOUS | Status: DC
  Administered 2015-08-31: 500 [IU]

## 2015-08-31 MED ORDER — HEPARIN SOD (PORK) LOCK FLUSH 100 UNIT/ML IV SOLN
INTRAVENOUS | Status: AC
  Administered 2015-08-31: 500 [IU]
  Filled 2015-08-31: qty 5

## 2015-10-01 ENCOUNTER — Encounter (HOSPITAL_COMMUNITY): Payer: Medicare Other

## 2015-10-13 ENCOUNTER — Encounter (HOSPITAL_COMMUNITY)
Admission: RE | Admit: 2015-10-13 | Discharge: 2015-10-13 | Disposition: A | Discharge status: Home / Self Care | Payer: Medicare Other | Source: Ambulatory Visit | Attending: Clinical Neurophysiology | Admitting: Clinical Neurophysiology

## 2015-10-13 DIAGNOSIS — Z452 Encounter for adjustment and management of vascular access device: Secondary | ICD-10-CM | POA: Insufficient documentation

## 2015-10-13 MED ORDER — HEPARIN SOD (PORK) LOCK FLUSH 100 UNIT/ML IV SOLN
INTRAVENOUS | Status: AC
  Filled 2015-10-13: qty 5

## 2015-10-13 MED ORDER — HEPARIN SOD (PORK) LOCK FLUSH 100 UNIT/ML IV SOLN: 500.0000 [IU] | INTRAVENOUS | Status: DC

## 2015-10-21 ENCOUNTER — Encounter (HOSPITAL_COMMUNITY): Payer: Medicare Other

## 2016-06-29 IMAGING — CR DG KNEE COMPLETE 4+V*R*
4 series · 4 of 4 positions shown · non-contrast
Comparison: None.

CLINICAL DATA: Knee pain, on long-term steroid therapy

EXAM:
RIGHT KNEE - COMPLETE 4+ VIEW

[t knee ap right]
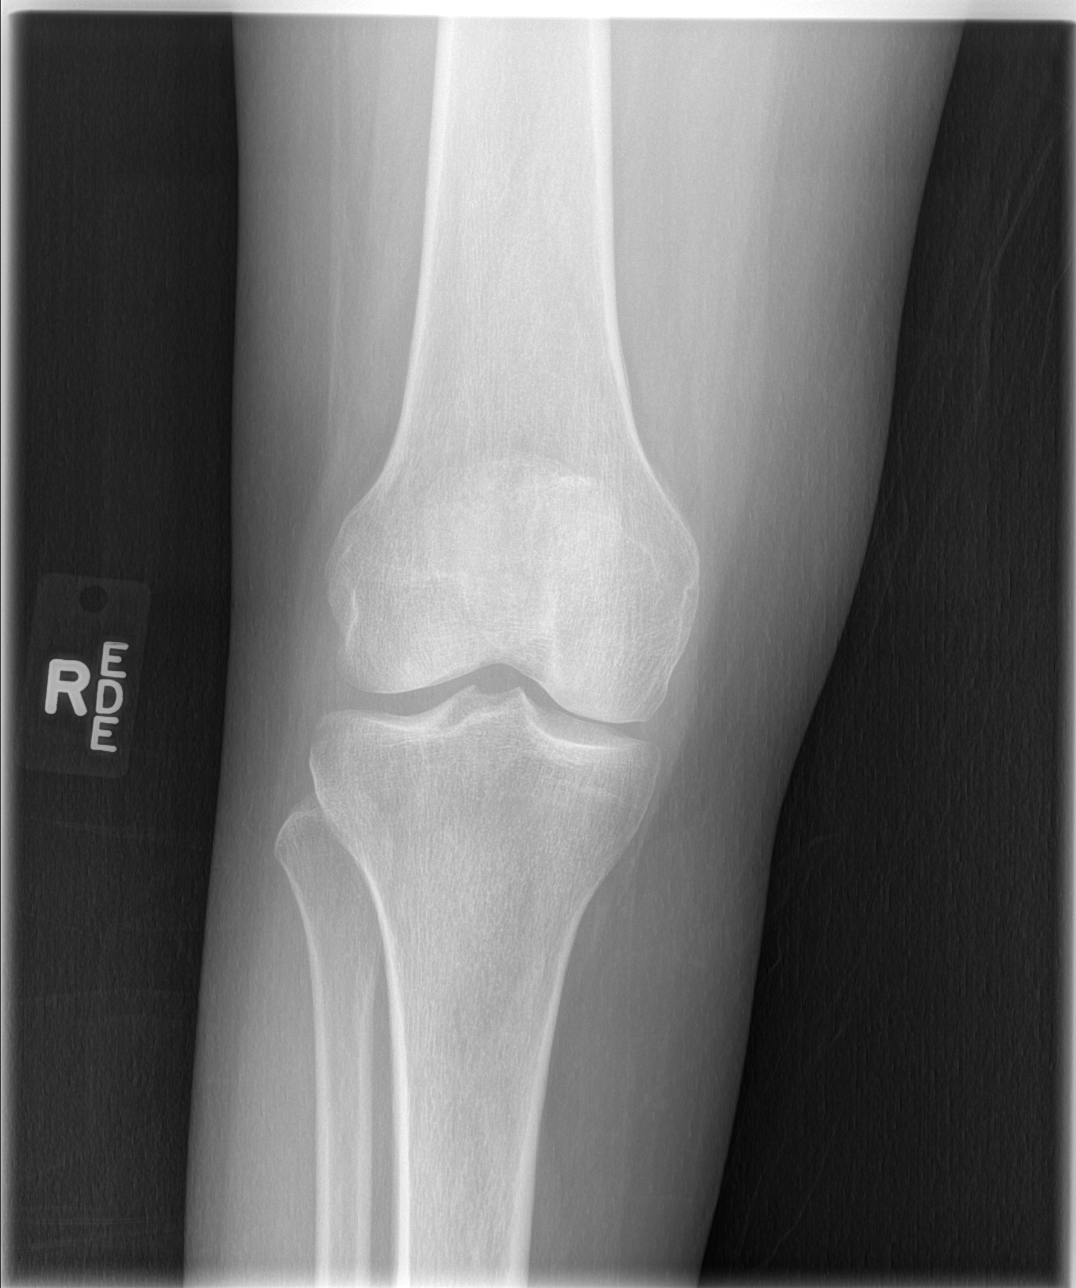

[t knee oblique right (1 of 2)]
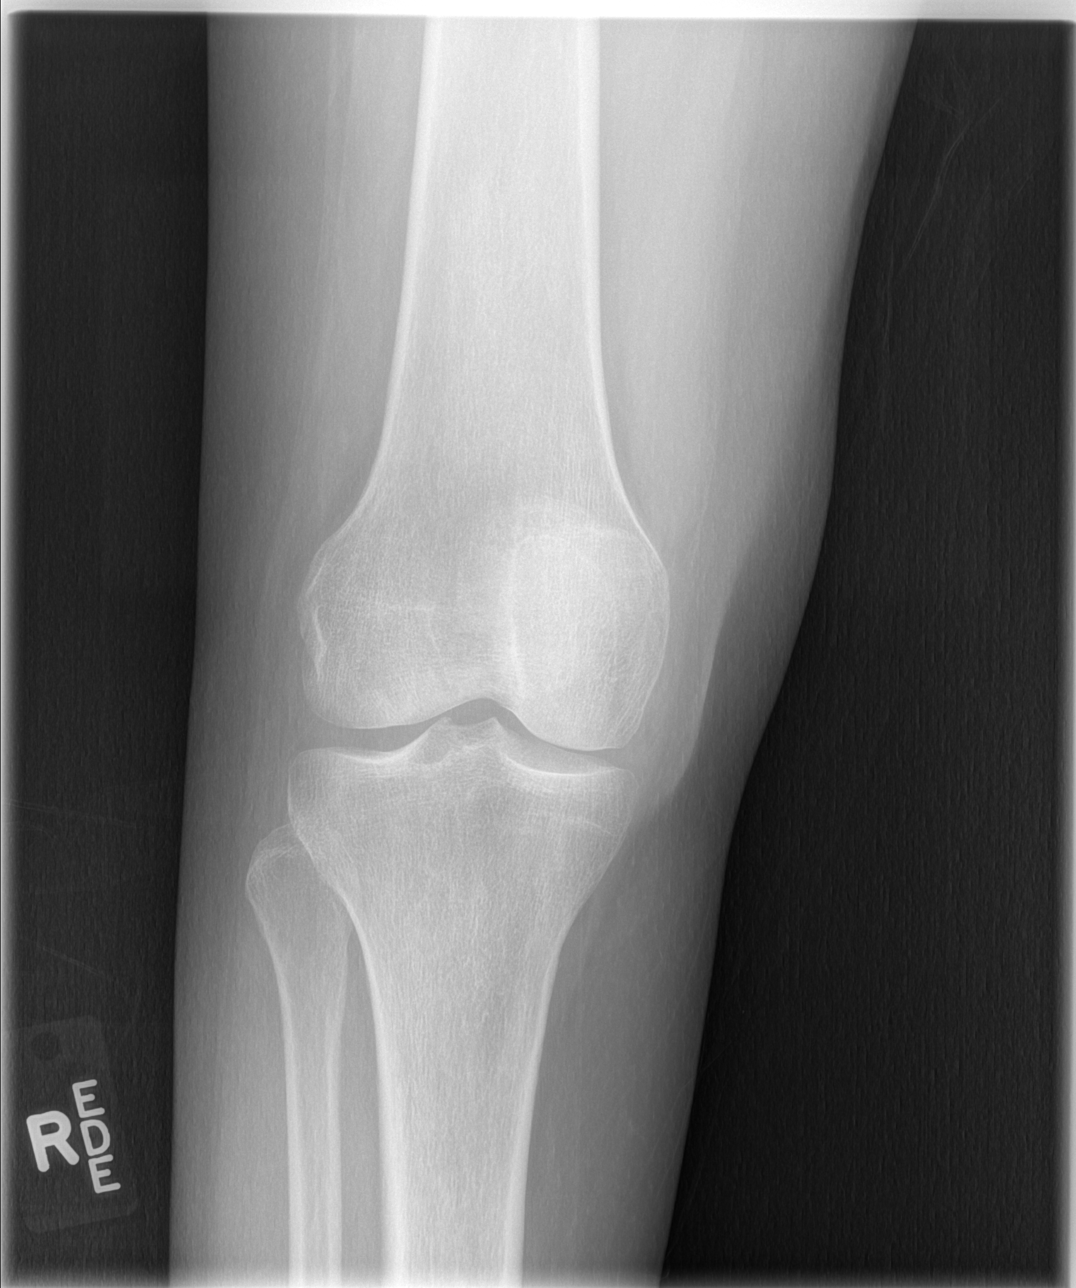

[t knee oblique right (2 of 2)]
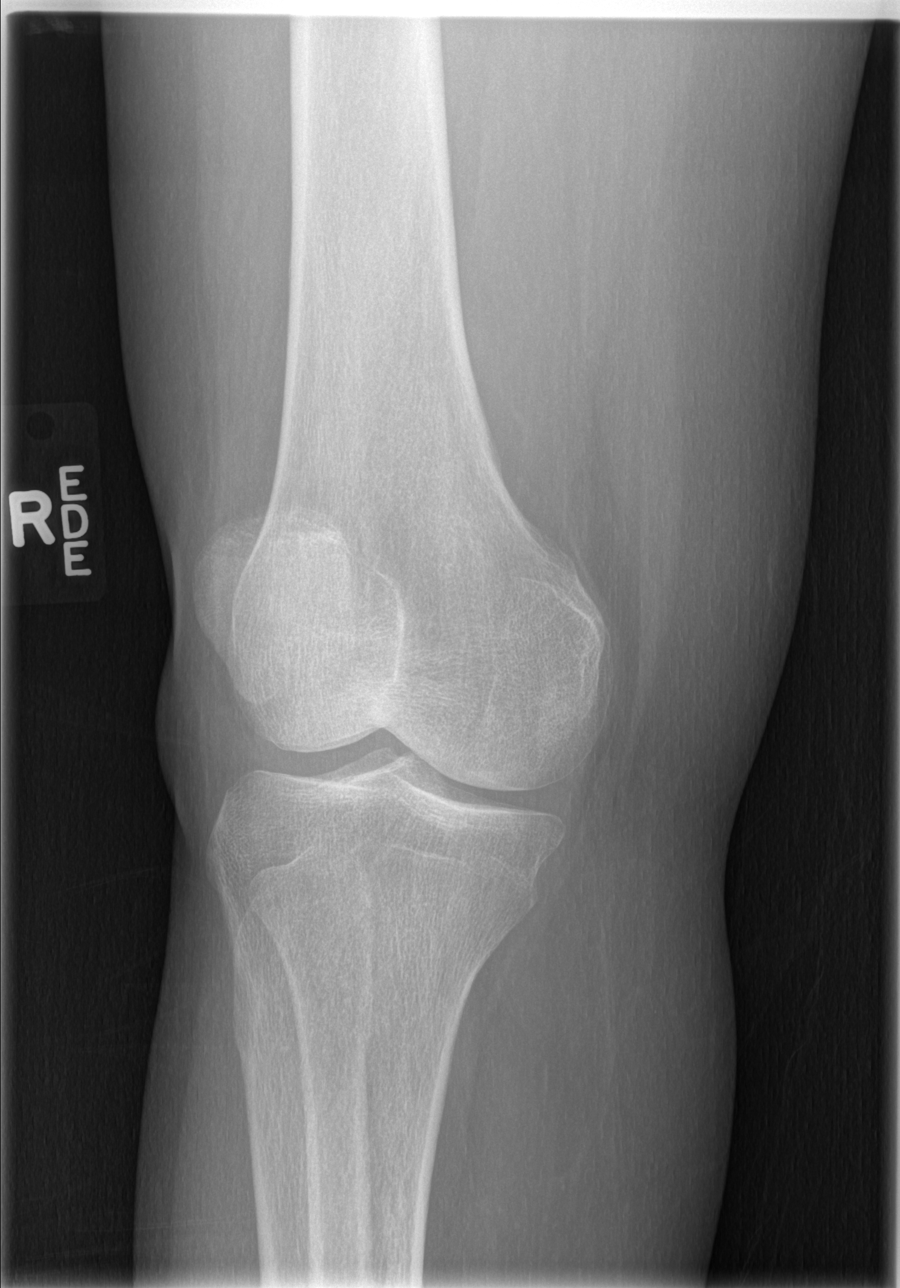

[t knee lat right]
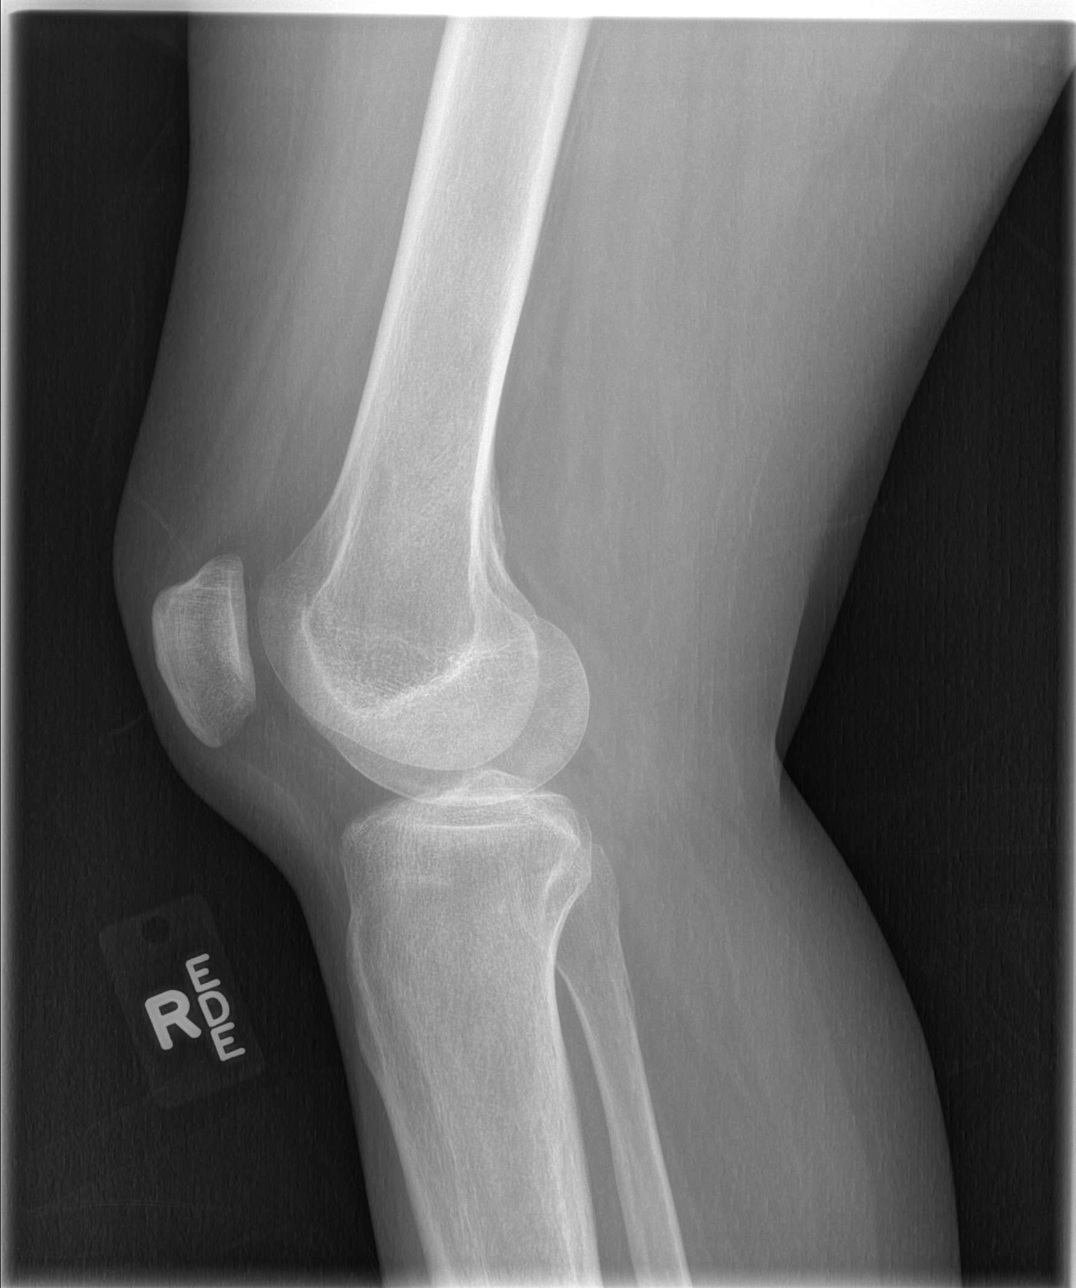

[4 of 4 positions shown; findings below may reference images not displayed]

FINDINGS: The right knee joint spaces are well preserved. No fracture is seen
and no effusion is noted. The bones appear normally mineralized.
IMPRESSION: Negative.

## 2017-02-21 DIAGNOSIS — E559 Vitamin D deficiency, unspecified: Secondary | ICD-10-CM | POA: Insufficient documentation

## 2017-02-21 DIAGNOSIS — A6000 Herpesviral infection of urogenital system, unspecified: Secondary | ICD-10-CM | POA: Insufficient documentation

## 2017-04-22 ENCOUNTER — Encounter (HOSPITAL_COMMUNITY): Payer: Self-pay | Admitting: Emergency Medicine

## 2017-04-22 ENCOUNTER — Emergency Department (HOSPITAL_COMMUNITY)
Admission: EM | Admit: 2017-04-22 | Discharge: 2017-04-22 | Disposition: A | Discharge status: Other Acute Inpatient Hospital | Payer: Medicare Other | Attending: Emergency Medicine | Admitting: Emergency Medicine

## 2017-04-22 DIAGNOSIS — R45851 Suicidal ideations: Secondary | ICD-10-CM | POA: Diagnosis not present

## 2017-04-22 DIAGNOSIS — F333 Major depressive disorder, recurrent, severe with psychotic symptoms: Secondary | ICD-10-CM | POA: Diagnosis not present

## 2017-04-22 DIAGNOSIS — Z79899 Other long term (current) drug therapy: Secondary | ICD-10-CM | POA: Diagnosis not present

## 2017-04-22 DIAGNOSIS — F329 Major depressive disorder, single episode, unspecified: Secondary | ICD-10-CM | POA: Diagnosis present

## 2017-04-22 DIAGNOSIS — I10 Essential (primary) hypertension: Secondary | ICD-10-CM | POA: Diagnosis not present

## 2017-04-22 DIAGNOSIS — R45850 Homicidal ideations: Secondary | ICD-10-CM | POA: Diagnosis not present

## 2017-04-22 LAB — COMPREHENSIVE METABOLIC PANEL
ALBUMIN: 4.3 g/dL (ref 3.5–5.0)
ALK PHOS: 67 U/L (ref 38–126)
ALT: 17 U/L (ref 14–54)
AST: 24 U/L (ref 15–41)
Anion gap: 8 (ref 5–15)
BUN: 8 mg/dL (ref 6–20)
CO2: 21 mmol/L — ABNORMAL LOW (ref 22–32)
CREATININE: 0.6 mg/dL (ref 0.44–1.00)
Calcium: 9.1 mg/dL (ref 8.9–10.3)
Chloride: 107 mmol/L (ref 101–111)
GFR calc Af Amer: >60 mL/min (ref >60)
GLUCOSE: 95 mg/dL (ref 65–99)
Potassium: 3 mmol/L — ABNORMAL LOW (ref 3.5–5.1)
Sodium: 136 mmol/L (ref 135–145)
TOTAL PROTEIN: 7.9 g/dL (ref 6.5–8.1)
Total Bilirubin: 0.4 mg/dL (ref 0.3–1.2)

## 2017-04-22 LAB — RAPID URINE DRUG SCREEN, HOSP PERFORMED
Amphetamines: NOT DETECTED
BARBITURATES: NOT DETECTED
BENZODIAZEPINES: NOT DETECTED
Cocaine: NOT DETECTED
Opiates: NOT DETECTED
Tetrahydrocannabinol: POSITIVE — AB

## 2017-04-22 LAB — CBC
HEMATOCRIT: 44.2 % (ref 36.0–46.0)
Hemoglobin: 15.7 g/dL — ABNORMAL HIGH (ref 12.0–15.0)
MCH: 31.8 pg (ref 26.0–34.0)
MCHC: 35.5 g/dL (ref 30.0–36.0)
MCV: 89.7 fL (ref 78.0–100.0)
PLATELETS: 318 10*3/uL (ref 150–400)
RBC: 4.93 MIL/uL (ref 3.87–5.11)
RDW: 14.6 % (ref 11.5–15.5)
WBC: 5.7 10*3/uL (ref 4.0–10.5)

## 2017-04-22 LAB — PREGNANCY, URINE: PREG TEST UR: NEGATIVE

## 2017-04-22 LAB — ETHANOL

## 2017-04-22 LAB — SALICYLATE LEVEL: Salicylate Lvl: <7 mg/dL (ref 2.8–30.0)

## 2017-04-22 LAB — ACETAMINOPHEN LEVEL: Acetaminophen (Tylenol), Serum: <10 ug/mL — ABNORMAL LOW (ref 10–30)

## 2017-04-22 MED ORDER — VALACYCLOVIR HCL 500 MG PO TABS
500.0000 mg | ORAL_TABLET | Freq: Every day | ORAL | Status: DC
  Administered 2017-04-22: 500 mg via ORAL
  Filled 2017-04-22: qty 1

## 2017-04-22 MED ORDER — ALUM & MAG HYDROXIDE-SIMETH 200-200-20 MG/5ML PO SUSP: 30.0000 mL | Freq: Four times a day (QID) | ORAL | Status: DC | PRN

## 2017-04-22 MED ORDER — ROSUVASTATIN CALCIUM 5 MG PO TABS
5.0000 mg | ORAL_TABLET | Freq: Every day | ORAL | Status: DC
  Administered 2017-04-22: 5 mg via ORAL
  Filled 2017-04-22: qty 1

## 2017-04-22 MED ORDER — PREDNISONE 5 MG PO TABS
15.0000 mg | ORAL_TABLET | Freq: Every day | ORAL | Status: DC
  Administered 2017-04-22: 15 mg via ORAL
  Filled 2017-04-22: qty 1

## 2017-04-22 MED ORDER — HYDROCODONE-ACETAMINOPHEN 7.5-325 MG PO TABS
1.0000 | ORAL_TABLET | Freq: Two times a day (BID) | ORAL | Status: DC | PRN
  Administered 2017-04-22: 1 via ORAL
  Filled 2017-04-22: qty 1

## 2017-04-22 MED ORDER — LISINOPRIL 10 MG PO TABS
10.0000 mg | ORAL_TABLET | Freq: Every day | ORAL | Status: DC
  Administered 2017-04-22: 10 mg via ORAL
  Filled 2017-04-22: qty 1

## 2017-04-22 MED ORDER — ACETAMINOPHEN 325 MG PO TABS: 650.0000 mg | ORAL_TABLET | ORAL | Status: DC | PRN

## 2017-04-22 MED ORDER — ONDANSETRON HCL 4 MG PO TABS: 4.0000 mg | ORAL_TABLET | Freq: Three times a day (TID) | ORAL | Status: DC | PRN

## 2017-04-22 MED ORDER — POTASSIUM CHLORIDE CRYS ER 20 MEQ PO TBCR
40.0000 meq | EXTENDED_RELEASE_TABLET | Freq: Once | ORAL | Status: AC
  Administered 2017-04-22: 40 meq via ORAL
  Filled 2017-04-22: qty 2

## 2017-04-22 MED ORDER — ZOLPIDEM TARTRATE 5 MG PO TABS: 5.0000 mg | ORAL_TABLET | Freq: Every evening | ORAL | Status: DC | PRN

## 2017-04-22 MED ORDER — PANTOPRAZOLE SODIUM 40 MG PO TBEC
40.0000 mg | DELAYED_RELEASE_TABLET | Freq: Every day | ORAL | Status: DC
  Administered 2017-04-22: 40 mg via ORAL
  Filled 2017-04-22: qty 1

## 2017-04-22 MED ORDER — NICOTINE 21 MG/24HR TD PT24: 21.0000 mg | MEDICATED_PATCH | Freq: Every day | TRANSDERMAL | Status: DC

## 2017-04-22 NOTE — ED Notes (Addendum)
Pt voiced agreement w/tx plan - accepted to [facility] - after 2200. Pt signed consent forms - copy faxed to [facility], copy sent to Medical Records, and original placed in evnelope for [facility]. Pt ambulates w/cane - at bedside - states she can ambulate w/walker at [facility].

## 2017-04-22 NOTE — BHH Counselor (Signed)
Pt accepted to [facility] 307-1.  May arrive at or after 10 PM on 04/22/17.  Accepting provider is T. Beryle Lathe, NP.  Attending is Dr. Jama Flavors.

## 2017-04-22 NOTE — BH Assessment (Signed)
Tele Assessment Note   Patient Name: [name] MRN: [id number] Referring Physician: EDP Location of Patient: [facility] Location of Provider: Behavioral Health TTS Department  Delayza R Desir is a 41 y.o. female who presented to [facility] on a voluntary basis with complaint of suicidal ideation, homicidal ideation, and other depressive symptoms.  Pt provided history.   Pt stated that she has a history of depressive symptoms and attempted suicide once when she was 16.  Pt reported that for the last two months, she has experienced significant depressive symptoms, including suicidal ideation (without plan), despondency, anger, fatigue, feelings of worthlessness and hopelessness, isolation, and insomnia (about four hours per night).  Pt also reported that she is currently homicidal -- for the last two weeks, she has experienced a growing desire to harm others who anger her.  Pt does not have a plan or specified victim.  In addition to these symptoms, Pt endorsed episodic use of marijuana.  Pt lives with her son and girlfriend.  She reported that she is on disability due to a muscle disease.    During assessment, Pt presented as alert and oriented.  She had good eye contact and was cooperative.  Pt was dressed in scrubs and appeared appropriately groomed.  Demeanor was tearful.  Pt's mood was depressed, and affect was mood-congruent.  Pt endorsed ongoing suicidal and homicidal ideation, as well as other depressive symptoms.  She denied auditory/visual hallucination and self-injurious behavior.  Pt's speech was normal in rate, rhythm, and volume.  Thought processes were within normal range, and thought content was logical and goal-oriented.  There was no evidence of delusion.  Pt's memory and concentration were intact.  Impulse control, judgment, and insight were fair.  Consulted with Julian Hy, NP who recommended inpatient placement.  Diagnosis: Major Depressive Disorder, Recurrent, Severe w/o psychotic  features  Past Medical History:  Past Medical History:  Diagnosis Date  . Hypertension   . Polymastia     Past Surgical History:  Procedure Laterality Date  . BRAIN SURGERY      Family History: No family history on file.  Social History:  reports that she has never smoked. She has never used smokeless tobacco. She reports that she uses drugs, including Marijuana. She reports that she does not drink alcohol.  Additional Social History:  Alcohol / Drug Use Pain Medications: See MAR Prescriptions: See MAR Over the Counter: See MAR History of alcohol / drug use?: Yes Substance #1 Name of Substance 1: Marijuana 1 - Frequency: Episodic 1 - Duration: Ongoing 1 - Last Use / Amount: 04/22/17  CIWA: CIWA-Ar BP: (!) 163/106 Pulse Rate: (!) 102 COWS:    PATIENT STRENGTHS: (choose at least two) Average or above average intelligence Capable of independent living  Allergies: No Known Allergies  Home Medications:  (Not in a hospital admission)  OB/GYN Status:  Patient's last menstrual period was 03/29/2017.  General Assessment Data TTS Assessment: In system Is this a Tele or Face-to-Face Assessment?: Tele Assessment Is this an Initial Assessment or a Re-assessment for this encounter?: Initial Assessment Marital status: Long term relationship Is patient pregnant?: No Pregnancy Status: No Living Arrangements: Children, Spouse/significant other Can pt return to current living arrangement?: Yes Admission Status: Voluntary Is patient capable of signing voluntary admission?: Yes Referral Source: Self/Family/Friend Insurance type: [place]     Crisis Care Plan Living Arrangements: Children, Spouse/significant other Name of Psychiatrist: None Name of Therapist: None  Education Status Is patient currently in school?: No  Risk  to self with the past 6 months Suicidal Ideation: Yes-Currently Present Has patient been a risk to self within the past 6 months prior to admission? :  No Suicidal Intent: No Has patient had any suicidal intent within the past 6 months prior to admission? : No Is patient at risk for suicide?: Yes Suicidal Plan?: No Has patient had any suicidal plan within the past 6 months prior to admission? : No Access to Means: No What has been your use of drugs/alcohol within the last 12 months?: Marijuana Previous Attempts/Gestures: Yes How many times?: 1 Triggers for Past Attempts: Family contact Intentional Self Injurious Behavior: None Family Suicide History: No Recent stressful life event(s): Conflict (Comment) Persecutory voices/beliefs?: No Depression: Yes Depression Symptoms: Despondent, Tearfulness, Insomnia, Isolating, Fatigue, Loss of interest in usual pleasures, Feeling angry/irritable Substance abuse history and/or treatment for substance abuse?: Yes Suicide prevention information given to non-admitted patients: Not applicable  Risk to Others within the past 6 months Homicidal Ideation: Yes-Currently Present Does patient have any lifetime risk of violence toward others beyond the six months prior to admission? : No Thoughts of Harm to Others: Yes-Currently Present Comment - Thoughts of Harm to Others: "I get so angry, I want to harm everyone who gets in my way" Current Homicidal Intent: No Current Homicidal Plan: No Access to Homicidal Means: No History of harm to others?: No Assessment of Violence: None Noted Does patient have access to weapons?: No Criminal Charges Pending?: No Does patient have a court date: No Is patient on probation?: No  Psychosis Hallucinations: None noted Delusions: None noted  Mental Status Report Appearance/Hygiene: In scrubs, Unremarkable Eye Contact: Good Motor Activity: Freedom of movement, Unremarkable Speech: Logical/coherent, Unremarkable Level of Consciousness: Crying, Alert Mood: Depressed Affect: Appropriate to circumstance Anxiety Level: None Thought Processes: Coherent,  Relevant Judgement: Partial Orientation: Person, Place, Time, Situation Obsessive Compulsive Thoughts/Behaviors: None  Cognitive Functioning Concentration: Normal Memory: Recent Intact, Remote Intact IQ: Average Insight: Fair Impulse Control: Poor Appetite: Good Sleep: Decreased Total Hours of Sleep: 4 Vegetative Symptoms: None  ADLScreening [facility] Assessment Services) Patient's cognitive ability adequate to safely complete daily activities?: Yes Patient able to express need for assistance with ADLs?: Yes Independently performs ADLs?: Yes (appropriate for developmental age)  Prior Inpatient Therapy Prior Inpatient Therapy: No  Prior Outpatient Therapy Prior Outpatient Therapy: No Does patient have an ACCT team?: No Does patient have Intensive In-House Services?  : No Does patient have [facility] services? : No Does patient have P4CC services?: No  ADL Screening (condition at time of admission) Patient's cognitive ability adequate to safely complete daily activities?: Yes Is the patient deaf or have difficulty hearing?: No Does the patient have difficulty seeing, even when wearing glasses/contacts?: No Does the patient have difficulty concentrating, remembering, or making decisions?: No Patient able to express need for assistance with ADLs?: Yes Does the patient have difficulty dressing or bathing?: No Independently performs ADLs?: Yes (appropriate for developmental age) Does the patient have difficulty walking or climbing stairs?: No Weakness of Legs: None Weakness of Arms/Hands: None  Home Assistive Devices/Equipment Home Assistive Devices/Equipment: None  Therapy Consults (therapy consults require a physician order) PT Evaluation Needed: No OT Evalulation Needed: No SLP Evaluation Needed: No Abuse/Neglect Assessment (Assessment to be complete while patient is alone) Physical Abuse: Denies Verbal Abuse: Denies Sexual Abuse: Denies Exploitation of patient/patient's  resources: Denies Self-Neglect: Denies Values / Beliefs Cultural Requests During Hospitalization: None Spiritual Requests During Hospitalization: None Consults Spiritual Care Consult Needed: No Social Work  Consult Needed: No Advance Directives (For Healthcare) Does Patient Have a Medical Advance Directive?: No    Additional Information 1:1 In Past 12 Months?: No CIRT Risk: No Elopement Risk: No Does patient have medical clearance?: Yes     Disposition:  Disposition Initial Assessment Completed for this Encounter: Yes Disposition of Patient: Inpatient treatment program Type of inpatient treatment program: Adult (Per Julian Hy, NP Pt meets inpt criteria)  This service was provided via telemedicine using a 2-way, interactive audio and video technology.  Names of all persons participating in this telemedicine service and their role in this encounter.               Dorris Fetch Alasia Enge 04/22/2017 4:41 PM

## 2017-04-22 NOTE — ED Notes (Addendum)
Snack given.

## 2017-04-22 NOTE — ED Triage Notes (Signed)
Pt. Stated, and crying at triage  Stated, [name] been feeling suicidal and homicidal for about a month, the last week Ive been on a rampage, yesterday was a bad day. My doctor gave me something for depression and it doesn't work

## 2017-04-22 NOTE — ED Provider Notes (Signed)
[facility]-EMERGENCY DEPT Provider Note   CSN: [id number] Arrival date & time: 04/22/17  1013     History   Chief Complaint Chief Complaint  Patient presents with  . Suicidal  . Homicidal    HPI   Blood pressure (!) 163/106, pulse (!) 102, temperature 98.3 F (36.8 C), temperature source Oral, resp. rate 17, height 5\' 9"  (1.753 m), weight 79.4 kg (175 lb), last menstrual period 03/29/2017, SpO2 100 %.  [name] is a 41 y.o. female complaining of Feeling depressed and hopeless with vague suicidal ideation (she's had prior suicide attempt with acetaminophen overdose) and homicidal ideation. She endorses auditory command hallucinations telling her to kill herself and kill people who are anchoring her. She smokes marijuana but doesn't do any other drugs and doesn't drink alcohol regularly. She's been feeling depressed for several months, there was no inciting event. She was seen by her primary care doctor and started on antidepressant but she feels that isn't helping. She denies any auditory hallucinations, chest pain, abdominal pain, headache. She has history of hypertension and she hasn't had her hypertension meds today. She had them yesterday.  Past Medical History:  Diagnosis Date  . Hypertension   . Polymastia     Patient Active Problem List   Diagnosis Date Noted  . Low bone mass 01/30/2014  . COMMON VARIABLE IMMUNODEFICIENCY 05/25/2010  . INFECTION, SKIN AND SOFT TISSUE 05/19/2010  . CONJUNCTIVITIS, ALLERGIC 07/29/2009  . SKIN RASH 06/17/2009  . TINEA VERSICOLOR 04/14/2009  . NASOPHARYNGITIS 04/14/2009  . DERMATOMYOSITIS 02/25/2009  . CANDIDIASIS 02/19/2009  . INGROWN NAIL 02/01/2009  . HYPERCHOLESTEROLEMIA 01/28/2009  . RENAL CALCULUS, RIGHT 06/18/2008  . HYPERTENSION, BENIGN ESSENTIAL 06/16/2008  . POLYMYOSITIS 06/16/2008  . INSOMNIA 02/19/2008  . DERMATOMYCOSIS 01/08/2008  . 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 WEAKNESS, LEFT SIDE OF BODY 11/18/2007  . DISC  DISEASE, LUMBAR 11/18/2007  . MEMORY LOSS 11/18/2007  . BACK PAIN 11/11/2007  . ARM NUMBNESS 11/11/2007    Past Surgical History:  Procedure Laterality Date  . BRAIN SURGERY      OB History    No data available       Home Medications    Prior to Admission medications   Medication Sig Start Date End Date Taking? Authorizing Provider  HYDROcodone-acetaminophen (NORCO) 7.5-325 MG tablet Take 1 tablet by mouth 2 (two) times daily as needed for moderate pain.   Yes [provider]  lisinopril (PRINIVIL,ZESTRIL) 10 MG tablet Take 10 mg by mouth daily.    Yes [provider]  Methotrexate, PF, 25 MG/0.4ML SOAJ Inject 25 mg into the skin every Wednesday.    Yes [provider]  Multiple Vitamins-Minerals (ONE-A-DAY VITACRAVES ADULT) CHEW Chew 1 each by mouth daily.   Yes [provider]  omeprazole (PRILOSEC) 40 MG capsule Take 40 mg by mouth daily.   Yes [provider]  predniSONE (DELTASONE) 5 MG tablet Take 15 mg by mouth daily.   Yes [provider]  rosuvastatin (CRESTOR) 5 MG tablet Take 5 mg by mouth daily.   Yes [provider]  valACYclovir (VALTREX) 500 MG tablet Take 500 mg by mouth daily.   Yes [provider]    Family History No family history on file.  Social History Social History  Substance Use Topics  . Smoking status: Never Smoker  . Smokeless tobacco: Never Used  . Alcohol use No     Allergies   Patient has no known allergies.   Review of Systems  Review of Systems  A complete review of systems was obtained and all systems are negative except as noted in the HPI and PMH.   Physical Exam Updated Vital Signs BP (!) 163/106 (BP Location: Right Arm)   Pulse (!) 102   Temp 98.3 F (36.8 C) (Oral)   Resp 17   Ht 5\' 9"  (1.753 m)   Wt 79.4 kg (175 lb)   LMP 03/29/2017   SpO2 100%   BMI 25.84 kg/m   Physical Exam  Constitutional: She is oriented to person, place, and time. She  appears well-developed and well-nourished. No distress.  HENT:  Head: Normocephalic and atraumatic.  Mouth/Throat: Oropharynx is clear and moist.  Eyes: Pupils are equal, round, and reactive to light. Conjunctivae and EOM are normal.  Neck: Normal range of motion.  Cardiovascular: Normal rate, regular rhythm and intact distal pulses.   Pulmonary/Chest: Effort normal and breath sounds normal.  Abdominal: Soft. There is no tenderness.  Musculoskeletal: Normal range of motion.  Neurological: She is alert and oriented to person, place, and time.  Skin: She is not diaphoretic.  Psychiatric:  Tearful, depressed, does not appear to be respond to internal stimuli. Vague suicidal ideations, vague homicidal ideations.  Nursing note and vitals reviewed.    ED Treatments / Results  Labs (all labs ordered are listed, but only abnormal results are displayed) Labs Reviewed  COMPREHENSIVE METABOLIC PANEL - Abnormal; Notable for the following:       Result Value   Potassium 3.0 (*)    CO2 21 (*)    All other components within normal limits  ACETAMINOPHEN LEVEL - Abnormal; Notable for the following:    Acetaminophen (Tylenol), Serum <10 (*)    All other components within normal limits  CBC - Abnormal; Notable for the following:    Hemoglobin 15.7 (*)    All other components within normal limits  RAPID URINE DRUG SCREEN, HOSP PERFORMED - Abnormal; Notable for the following:    Tetrahydrocannabinol POSITIVE (*)    All other components within normal limits  ETHANOL  SALICYLATE LEVEL  I-STAT BETA HCG BLOOD, ED ([facility], [facility], [facility] ONLY)    EKG  EKG Interpretation None       Radiology No results found.  Procedures Procedures (including critical care time)  Medications Ordered in ED Medications  potassium chloride SA (K-DUR,KLOR-CON) CR tablet 40 mEq (not administered)  HYDROcodone-acetaminophen (NORCO) 7.5-325 MG per tablet 1 tablet (not administered)  lisinopril (PRINIVIL,ZESTRIL) tablet  10 mg (not administered)  pantoprazole (PROTONIX) EC tablet 40 mg (not administered)  predniSONE (DELTASONE) tablet 15 mg (not administered)  rosuvastatin (CRESTOR) tablet 5 mg (not administered)  valACYclovir (VALTREX) tablet 500 mg (not administered)  nicotine (NICODERM CQ - dosed in mg/24 hours) patch 21 mg (not administered)  alum & mag hydroxide-simeth (MAALOX/MYLANTA) 200-200-20 MG/5ML suspension 30 mL (not administered)  ondansetron (ZOFRAN) tablet 4 mg (not administered)  zolpidem (AMBIEN) tablet 5 mg (not administered)  acetaminophen (TYLENOL) tablet 650 mg (not administered)     Initial Impression / Assessment and Plan / ED Course  I have reviewed the triage vital signs and the nursing notes.  Pertinent labs & imaging results that were available during my care of the patient were reviewed by me and considered in my medical decision making (see chart for details).     Vitals:   04/22/17 1037 04/22/17 1038  BP: (!) 163/106   Pulse: (!) 102   Resp: 17   Temp: 98.3 F (36.8 C)  TempSrc: Oral   SpO2: 100%   Weight:  79.4 kg (175 lb)  Height:  5\' 9"  (1.753 m)    Medications  potassium chloride SA (K-DUR,KLOR-CON) CR tablet 40 mEq (not administered)  HYDROcodone-acetaminophen (NORCO) 7.5-325 MG per tablet 1 tablet (not administered)  lisinopril (PRINIVIL,ZESTRIL) tablet 10 mg (not administered)  pantoprazole (PROTONIX) EC tablet 40 mg (not administered)  predniSONE (DELTASONE) tablet 15 mg (not administered)  rosuvastatin (CRESTOR) tablet 5 mg (not administered)  valACYclovir (VALTREX) tablet 500 mg (not administered)  nicotine (NICODERM CQ - dosed in mg/24 hours) patch 21 mg (not administered)  alum & mag hydroxide-simeth (MAALOX/MYLANTA) 200-200-20 MG/5ML suspension 30 mL (not administered)  ondansetron (ZOFRAN) tablet 4 mg (not administered)  zolpidem (AMBIEN) tablet 5 mg (not administered)  acetaminophen (TYLENOL) tablet 650 mg (not administered)    Lori R  Potter is 41 y.o. female presenting with auditory command hallucinations, suicidal ideation, homicidal ideation and severe depression.  Blood pressure is elevated however she's been noncompliant with her hypertension medications. No signs of hypertensive urgency or end organ damage. Mildly hypokalemic.  Patient is medically cleared for psychiatric evaluation will be transferred to the psych ED. TTS consulted, home meds and psych standard holding orders placed.    Final Clinical Impressions(s) / ED Diagnoses   Final diagnoses:  Suicidal ideation  Homicidal ideation    New Prescriptions New Prescriptions   No medications on file     Kaylyn Lim 04/22/17 1455    Tilden Fossa, MD 04/23/17 [phone]

## 2017-04-23 ENCOUNTER — Encounter (HOSPITAL_COMMUNITY): Payer: Self-pay | Admitting: *Deleted

## 2017-04-23 ENCOUNTER — Inpatient Hospital Stay (HOSPITAL_COMMUNITY)
Admission: AD | Admit: 2017-04-23 | Discharge: 2017-04-25 | DRG: 881 | Disposition: A | Discharge status: Home / Self Care | Payer: Medicare Other | Source: Intra-hospital | Attending: Psychiatry | Admitting: Psychiatry

## 2017-04-23 DIAGNOSIS — F1220 Cannabis dependence, uncomplicated: Secondary | ICD-10-CM | POA: Diagnosis present

## 2017-04-23 DIAGNOSIS — F1210 Cannabis abuse, uncomplicated: Secondary | ICD-10-CM | POA: Diagnosis not present

## 2017-04-23 DIAGNOSIS — Z736 Limitation of activities due to disability: Secondary | ICD-10-CM | POA: Diagnosis not present

## 2017-04-23 DIAGNOSIS — G4700 Insomnia, unspecified: Secondary | ICD-10-CM | POA: Diagnosis present

## 2017-04-23 DIAGNOSIS — F332 Major depressive disorder, recurrent severe without psychotic features: Secondary | ICD-10-CM

## 2017-04-23 DIAGNOSIS — Z630 Problems in relationship with spouse or partner: Secondary | ICD-10-CM

## 2017-04-23 DIAGNOSIS — Z818 Family history of other mental and behavioral disorders: Secondary | ICD-10-CM

## 2017-04-23 DIAGNOSIS — F329 Major depressive disorder, single episode, unspecified: Principal | ICD-10-CM | POA: Diagnosis present

## 2017-04-23 DIAGNOSIS — F419 Anxiety disorder, unspecified: Secondary | ICD-10-CM | POA: Diagnosis present

## 2017-04-23 DIAGNOSIS — I10 Essential (primary) hypertension: Secondary | ICD-10-CM | POA: Diagnosis present

## 2017-04-23 DIAGNOSIS — M3320 Polymyositis, organ involvement unspecified: Secondary | ICD-10-CM | POA: Diagnosis present

## 2017-04-23 DIAGNOSIS — Z7952 Long term (current) use of systemic steroids: Secondary | ICD-10-CM | POA: Diagnosis not present

## 2017-04-23 DIAGNOSIS — Z79899 Other long term (current) drug therapy: Secondary | ICD-10-CM

## 2017-04-23 MED ORDER — HYDROXYZINE HCL 25 MG PO TABS
25.0000 mg | ORAL_TABLET | Freq: Three times a day (TID) | ORAL | Status: DC | PRN
  Administered 2017-04-23: 25 mg via ORAL

## 2017-04-23 MED ORDER — NICOTINE 21 MG/24HR TD PT24
21.0000 mg | MEDICATED_PATCH | Freq: Every day | TRANSDERMAL | Status: DC
  Filled 2017-04-23 (×4): qty 1

## 2017-04-23 MED ORDER — LISINOPRIL 10 MG PO TABS
10.0000 mg | ORAL_TABLET | Freq: Every day | ORAL | Status: DC
  Administered 2017-04-23 – 2017-04-25 (×3): 10 mg via ORAL
  Filled 2017-04-23 (×5): qty 1

## 2017-04-23 MED ORDER — ZOLPIDEM TARTRATE 5 MG PO TABS
5.0000 mg | ORAL_TABLET | Freq: Every evening | ORAL | Status: DC | PRN
  Administered 2017-04-23 – 2017-04-24 (×2): 5 mg via ORAL
  Filled 2017-04-23 (×2): qty 1

## 2017-04-23 MED ORDER — TRAZODONE HCL 50 MG PO TABS
50.0000 mg | ORAL_TABLET | Freq: Every evening | ORAL | Status: DC | PRN
Start: 2017-04-23 — End: 2017-04-23
  Administered 2017-04-23: 50 mg via ORAL

## 2017-04-23 MED ORDER — PREDNISONE 5 MG PO TABS
15.0000 mg | ORAL_TABLET | Freq: Every day | ORAL | Status: DC
  Administered 2017-04-23 – 2017-04-25 (×3): 15 mg via ORAL
  Filled 2017-04-23 (×5): qty 1

## 2017-04-23 MED ORDER — PANTOPRAZOLE SODIUM 40 MG PO TBEC
40.0000 mg | DELAYED_RELEASE_TABLET | Freq: Every day | ORAL | Status: DC
  Administered 2017-04-23 – 2017-04-25 (×3): 40 mg via ORAL
  Filled 2017-04-23 (×5): qty 1

## 2017-04-23 MED ORDER — ALUM & MAG HYDROXIDE-SIMETH 200-200-20 MG/5ML PO SUSP
30.0000 mL | ORAL | Status: DC | PRN
  Administered 2017-04-24: 30 mL via ORAL
  Filled 2017-04-23: qty 30

## 2017-04-23 MED ORDER — DULOXETINE HCL 20 MG PO CPEP
20.0000 mg | ORAL_CAPSULE | Freq: Every day | ORAL | Status: DC
  Administered 2017-04-23 – 2017-04-24 (×2): 20 mg via ORAL
  Filled 2017-04-23 (×5): qty 1

## 2017-04-23 MED ORDER — VALACYCLOVIR HCL 500 MG PO TABS
500.0000 mg | ORAL_TABLET | Freq: Every day | ORAL | Status: DC
  Administered 2017-04-23 – 2017-04-25 (×3): 500 mg via ORAL
  Filled 2017-04-23 (×5): qty 1

## 2017-04-23 MED ORDER — HYDROCODONE-ACETAMINOPHEN 5-325 MG PO TABS: 1.5000 | ORAL_TABLET | Freq: Two times a day (BID) | ORAL | Status: DC | PRN

## 2017-04-23 MED ORDER — POTASSIUM CHLORIDE CRYS ER 10 MEQ PO TBCR: 10.0000 meq | EXTENDED_RELEASE_TABLET | Freq: Two times a day (BID) | ORAL | Status: DC

## 2017-04-23 MED ORDER — MAGNESIUM HYDROXIDE 400 MG/5ML PO SUSP
30.0000 mL | Freq: Every day | ORAL | Status: DC | PRN
  Administered 2017-04-23: 30 mL via ORAL
  Filled 2017-04-23: qty 30

## 2017-04-23 MED ORDER — ACETAMINOPHEN 325 MG PO TABS
650.0000 mg | ORAL_TABLET | ORAL | Status: DC | PRN
  Administered 2017-04-23: 650 mg via ORAL
  Filled 2017-04-23: qty 2

## 2017-04-23 MED ORDER — ALUM & MAG HYDROXIDE-SIMETH 200-200-20 MG/5ML PO SUSP: 30.0000 mL | Freq: Four times a day (QID) | ORAL | Status: DC | PRN

## 2017-04-23 MED ORDER — ONDANSETRON HCL 4 MG PO TABS: 4.0000 mg | ORAL_TABLET | Freq: Three times a day (TID) | ORAL | Status: DC | PRN

## 2017-04-23 MED ORDER — ACETAMINOPHEN 325 MG PO TABS: 650.0000 mg | ORAL_TABLET | Freq: Four times a day (QID) | ORAL | Status: DC | PRN

## 2017-04-23 MED ORDER — DOCUSATE SODIUM 100 MG PO CAPS
100.0000 mg | ORAL_CAPSULE | Freq: Every day | ORAL | Status: DC
  Administered 2017-04-24 – 2017-04-25 (×2): 100 mg via ORAL
  Filled 2017-04-23 (×4): qty 1

## 2017-04-23 NOTE — Tx Team (Signed)
Initial Treatment Plan 04/23/2017 5:20 AM [name] [id number]    PATIENT STRESSORS: Financial difficulties Health problems Loss of relationship Marital or family conflict Occupational concerns Substance abuse   PATIENT STRENGTHS: Ability for insight Average or above average intelligence Communication skills Supportive family/friends   PATIENT IDENTIFIED PROBLEMS: At risk for suicide  Others directed violence  "Get my mind back right"  "Get my GED"               DISCHARGE CRITERIA:  Ability to meet basic life and health needs Improved stabilization in mood, thinking, and/or behavior Medical problems require only outpatient monitoring Motivation to continue treatment in a less acute level of care Need for constant or close observation no longer present  PRELIMINARY DISCHARGE PLAN: Outpatient therapy Return to previous living arrangement  PATIENT/FAMILY INVOLVEMENT: This treatment plan has been presented to and reviewed with the patient, [name].  The patient and family have been given the opportunity to ask questions and make suggestions.  Carleene Overlie, RN 04/23/2017, 5:20 AM

## 2017-04-23 NOTE — [facility] Group Notes (Signed)
[facility] LCSW Group Therapy  04/23/2017 1:32 PM  Type of Therapy:  Group Therapy  Participation Level:  Active  Participation Quality:  Attentive  Affect:  Appropriate  Cognitive:  Alert and Oriented  Insight:  Improving  Engagement in Therapy:  Improving  Modes of Intervention:  Discussion, Education, Exploration, Socialization and Support  Summary of Progress/Problems: MHA Speaker came to talk about his personal journey with mental illness. The pt processed ways by which to relate to the speaker. MHA speaker provided handouts and educational information pertaining to groups and services offered by the [facility]   Mehmet Scally N Smart LCSW 04/23/2017, 1:32 PM

## 2017-04-23 NOTE — [facility] Suicide Risk Assessment (Addendum)
[facility] Admission Suicide Risk Assessment   Nursing information obtained from:  Patient Demographic factors:  Gay, lesbian, or bisexual orientation, Unemployed Current Mental Status:  Suicidal ideation indicated by patient, Self-harm thoughts, Thoughts of violence towards others Loss Factors:  Decrease in vocational status, Loss of significant relationship, Decline in physical health, Financial problems / change in socioeconomic status Historical Factors:  Family history of mental illness or substance abuse Risk Reduction Factors:  Sense of responsibility to family, Living with another person, especially a relative, Positive social support  Total Time spent with patient: 45 minutes Principal Problem: MDD (major depressive disorder) Diagnosis:   Patient Active Problem List   Diagnosis Date Noted  . MDD (major depressive disorder) [F32.9] 04/23/2017  . Low bone mass [M85.80] 01/30/2014  . COMMON VARIABLE IMMUNODEFICIENCY [D83.9] 05/25/2010  . INFECTION, SKIN AND SOFT TISSUE [L08.9] 05/19/2010  . CONJUNCTIVITIS, ALLERGIC [H10.45] 07/29/2009  . SKIN RASH [R21] 06/17/2009  . TINEA VERSICOLOR [B36.0] 04/14/2009  . NASOPHARYNGITIS [J00] 04/14/2009  . DERMATOMYOSITIS [M33.90] 02/25/2009  . CANDIDIASIS [B37.9] 02/19/2009  . INGROWN NAIL [L60.0] 02/01/2009  . HYPERCHOLESTEROLEMIA [E78.00] 01/28/2009  . RENAL CALCULUS, RIGHT [N20.0] 06/18/2008  . HYPERTENSION, BENIGN ESSENTIAL [I10] 06/16/2008  . POLYMYOSITIS [M33.20] 06/16/2008  . INSOMNIA [G47.00] 02/19/2008  . DERMATOMYCOSIS [B36.9] 01/08/2008  . CHIARI MALFORMATION [G93.5] 11/20/2007  . WEAKNESS, LEFT SIDE OF BODY [G81.90] 11/18/2007  . DISC DISEASE, LUMBAR [M51.37] 11/18/2007  . MEMORY LOSS [R41.3] 11/18/2007  . BACK PAIN [M54.9] 11/11/2007  . ARM NUMBNESS [R20.9] 11/11/2007    Continued Clinical Symptoms:  Alcohol Use Disorder Identification Test Final Score (AUDIT): 0 The "Alcohol Use Disorders Identification Test", Guidelines for  Use in Primary Care, Second Edition.  World Science writer [place]). Score between 0-7:  no or low risk or alcohol related problems. Score between 8-15:  moderate risk of alcohol related problems. Score between 16-19:  high risk of alcohol related problems. Score 20 or above:  warrants further diagnostic evaluation for alcohol dependence and treatment.   CLINICAL FACTORS:  41 year old female. Was living with GF. She has one daughter aged 35. Currently on disability- reports she has been diagnosed with polymyositis. Denies legal issues .   Reports recent argument with GF- states " I blew up", and reports episode of agitation, where she broke TV, some furniture. States " I have been depressed, and she was upset because I was closed in, it escalated, and I lost it". States that she suffers from depression but also has episodes of angry outbursts .  Reports she has been struggling with depression, sadness, with thoughts of suicide . ( Denies any specific plan or intention). States " I have also been irritable , blowing up easy with people", and describes vague HI, but denies any specific violent or homicidal ideations towards anyone specific.  Denies hallucinations or psychotic symptoms.  No prior psychiatric admissions, states she attempted suicide at age 6 by overdosing, reports history of depression, reports history of frequent angry outbursts during which she sometimes breaks items, of short duration. Reports she tried Zoloft briefly years ago, does not feel it helped, no other psychiatric medication trials .  Medical History remarkable for polymyositis - takes Prednisone/Metotrexate injecition on a regular basis for several years.  Reports daily cannabis abuse, denies alcohol or other drug abuse   Dx. MDD versus Depression secondary to Medical Illness, Cannabis Dependence. Consider Intermittent Explosive Disorder .  Plan- inpatient admission- start Cymbalta 20 mgrs QDAY for depression and  anxiety. Reports she  has a history of taking Ambien for insomnia, with good results and no side effects. Patient reports received K+ supplement in ED, will repeat BMP to follow.    Musculoskeletal: Strength & Muscle Tone: within normal limits Gait & Station: normal Patient leans: N/A  Psychiatric Specialty Exam: Physical Exam  ROS mild headache, no chest pain, no shortness of breath, no vomiting, no diarrhea, no rash   Blood pressure 120/74, pulse 86, temperature 98.4 F (36.9 C), temperature source Oral, resp. rate 16, height 5\' 9"  (1.753 m), weight 79.4 kg (175 lb), last menstrual period 03/29/2017, SpO2 100 %.Body mass index is 25.84 kg/m.  General Appearance: Fairly Groomed  Eye Contact:  Good  Speech:  Normal Rate  Volume:  Normal  Mood:  Depressed  Affect:  constricted, intermittently tearful  Thought Process:  Linear and Descriptions of Associations: Intact  Orientation:  Other:  fully alert and attentive   Thought Content:  no hallucinations, no delusions  Suicidal Thoughts:  No denies any suicidal or self injurious ideations , contracts for safety on unit   Homicidal Thoughts:  No denies any current homicidal or violent ideations, contracts for safety on unit - specifically denies any violent or homicidal ideations towards GF  Memory:  recent and remote grossly intact   Judgement:  Fair  Insight:  Fair  Psychomotor Activity:  Normal  Concentration:  Concentration: Good and Attention Span: Good  Recall:  Good  Fund of Knowledge:  Good  Language:  Good  Akathisia:  No  Handed:  Right  AIMS (if indicated):     Assets:  Communication Skills Desire for Improvement Resilience  ADL's:  Intact  Cognition:  WNL  Sleep:  Number of Hours: 2      COGNITIVE FEATURES THAT CONTRIBUTE TO RISK:  Closed-mindedness and Loss of executive function    SUICIDE RISK:   Moderate:  Frequent suicidal ideation with limited intensity, and duration, some specificity in terms of plans, no  associated intent, good self-control, limited dysphoria/symptomatology, some risk factors present, and identifiable protective factors, including available and accessible social support.  PLAN OF CARE: Patient will be admitted to inpatient psychiatric unit for stabilization and safety. Will provide and encourage milieu participation. Provide medication management and maked adjustments as needed.  Will follow daily.    I certify that inpatient services furnished can reasonably be expected to improve the patient's condition.   Craige Cotta, MD 04/23/2017, 11:57 AM

## 2017-04-23 NOTE — [facility] Suicide Risk Assessment (Signed)
[facility] INPATIENT:  Family/Significant Other Suicide Prevention Education  Suicide Prevention Education:  Education Completed; [name], daughter [phone],  (name of family member/significant other) has been identified by the patient as the family member/significant other with whom the patient will be residing, and identified as the person(s) who will aid the patient in the event of a mental health crisis (suicidal ideations/suicide attempt).  With written consent from the patient, the family member/significant other has been provided the following suicide prevention education, prior to the and/or following the discharge of the patient.  The suicide prevention education provided includes the following:  Suicide risk factors  Suicide prevention and interventions  National Suicide Hotline telephone number  [facility] assessment telephone number  [facility] Emergency Assistance 911  [facility] and/or Residential Mobile Crisis Unit telephone number  Request made of family/significant other to:  Remove weapons (e.g., guns, rifles, knives), all items previously/currently identified as safety concern.    Remove drugs/medications (over-the-counter, prescriptions, illicit drugs), all items previously/currently identified as a safety concern.  The family member/significant other verbalizes understanding of the suicide prevention education information provided.  The family member/significant other agrees to remove the items of safety concern listed above.  "What needs to be worked on in her anger issues, when something goes wrong, she just goes off.  There's no other way she deals w being ticked off other than go to off like a bomb, she's not the same person when she's angry."  "She tends to be depressed due to her health, she isnt able to go to work and she has to stay home and deal w her mother."  Since health decline, she has been depressed/down.  "Not new, there's  something that's been going on for years, but its gotten worse to the point that she needed help."  In past, pt has not been open to help for mental health issues "she's tired, she's breaking down."  Family is willing to help patient get to appointments, "we will always make a way."  Over past few days, patient has said she "feels worthless, cannot work" but has not mentioned suicide/self harm.  Daughter confirms that patient does not have gun in the home, has medical medications in the home and patient has control of her own medicines.  Reviewed potential discharge planning options w daughter who is willing to help encourage patient to participate in referrals.  "As long as you can just get her right - her anger and depression."  Sallee Lange 04/23/2017, 1:26 PM
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
## 2017-04-23 NOTE — Progress Notes (Signed)
Patient ID: [name], female   DOB: Jun 28, 1976, 41 y.o.   MRN: [id number]  DAR: Pt. Denies SI/HI and A/V Hallucinations. She reports sleep is poor, appetite is fair, energy level is low, and concentration is poor. She rates depression 7/10, hopelessness 8/10, and anxiety 7/10. Patient does report chronic generalized pain and a headache this morning and received PRN Tylenol. Support and encouragement provided to the patient throughout the day. Patient was moved from 300 hall to 400 hall to better meet her needs of having a hospital bed. Scheduled medications administered to patient per physician's orders. Patient is receptive and cooperative. She is seen in the milieu but does appear to be keeping to herself as opposed to interacting with peers. She is tearful at times throughout the day especially when speaking on the phone. Q15 minute checks are maintained for safety.
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
## 2017-04-23 NOTE — H&P (Signed)
Psychiatric Admission Assessment Adult  Patient Identification: [name] MRN:  [id number] Date of Evaluation:  04/23/2017 Chief Complaint:  Patients states "I have been depressed for a year but the last two months have been very bad."  Principal Diagnosis: MDD (major depressive disorder) Diagnosis:   Patient Active Problem List   Diagnosis Date Noted  . MDD (major depressive disorder) [F32.9] 04/23/2017  . Low bone mass [M85.80] 01/30/2014  . COMMON VARIABLE IMMUNODEFICIENCY [D83.9] 05/25/2010  . INFECTION, SKIN AND SOFT TISSUE [L08.9] 05/19/2010  . CONJUNCTIVITIS, ALLERGIC [H10.45] 07/29/2009  . SKIN RASH [R21] 06/17/2009  . TINEA VERSICOLOR [B36.0] 04/14/2009  . NASOPHARYNGITIS [J00] 04/14/2009  . DERMATOMYOSITIS [M33.90] 02/25/2009  . CANDIDIASIS [B37.9] 02/19/2009  . INGROWN NAIL [L60.0] 02/01/2009  . HYPERCHOLESTEROLEMIA [E78.00] 01/28/2009  . RENAL CALCULUS, RIGHT [N20.0] 06/18/2008  . HYPERTENSION, BENIGN ESSENTIAL [I10] 06/16/2008  . POLYMYOSITIS [M33.20] 06/16/2008  . INSOMNIA [G47.00] 02/19/2008  . DERMATOMYCOSIS [B36.9] 01/08/2008  . CHIARI MALFORMATION [G93.5] 11/20/2007  . WEAKNESS, LEFT SIDE OF BODY [G81.90] 11/18/2007  . DISC DISEASE, LUMBAR [M51.37] 11/18/2007  . MEMORY LOSS [R41.3] 11/18/2007  . BACK PAIN [M54.9] 11/11/2007  . ARM NUMBNESS [R20.9] 11/11/2007   History of Present Illness:   [name] is a 41 year old female who was living with GF. She has one daughter aged 37. Currently on disability- reports she has been diagnosed with polymyositis. Denies legal issues .   Reports recent argument with GF- states " I blew up", and reports episode of agitation, where she broke TV, some furniture. States " I have been depressed, and she was upset because I was closed in, it escalated, and I lost it". States that she suffers from depression but also has episodes of angry outbursts .  Reports she has been struggling with depression, sadness, with thoughts  of suicide . ( Denies any specific plan or intention). States " I have also been irritable , blowing up easy with people", and describes vague HI, but denies any specific violent or homicidal ideations towards anyone specific.  Denies hallucinations or psychotic symptoms.  No prior psychiatric admissions, states she attempted suicide at age 54 by overdosing, reports history of depression, reports history of frequent angry outbursts during which she sometimes breaks items, of short duration. Reports she tried Zoloft briefly years ago, does not feel it helped, no other psychiatric medication trials .  Medical History remarkable for polymyositis - takes Prednisone/Metotrexate injecition on a regular basis for several years.  Reports daily cannabis abuse, denies alcohol or other drug abuse   Dx. MDD versus Depression secondary to Medical Illness, Cannabis Dependence. Consider Intermittent Explosive Disorder .  Plan- inpatient admission- start Cymbalta 20 mgrs QDAY for depression and anxiety. Reports she has a history of taking Ambien for insomnia, with good results and no side effects. Patient reports received K+ supplement in ED, will repeat BMP to follow. The [place] Controlled Substance database was reviewed as patient reports taking norco as needed for her muscle pain. She last filled norco 7.5-325 mg on 04/03/2017 that is actively being prescribed to her by Abram Sander MD.   Associated Signs/Symptoms: Depression Symptoms:  depressed mood, anhedonia, insomnia, fatigue, feelings of worthlessness/guilt, hopelessness, recurrent thoughts of death, suicidal thoughts with specific plan, anxiety, loss of energy/fatigue, (Hypo) Manic Symptoms:  Denies Anxiety Symptoms:  Excessive Worry, Psychotic Symptoms:  Denies PTSD Symptoms: Negative Total Time spent with patient: 45 minutes  Past Psychiatric History: MDD  Is the patient at risk to self? Yes.  Has the patient been a risk to self  in the past 6 months? No.  Has the patient been a risk to self within the distant past? No.  Is the patient a risk to others? No.  Has the patient been a risk to others in the past 6 months? No.  Has the patient been a risk to others within the distant past? No.   Prior Inpatient Therapy:  Denies Prior Outpatient Therapy:  Denies  Alcohol Screening: 1. How often do you have a drink containing alcohol?: Never 9. Have you or someone else been injured as a result of your drinking?: No 10. Has a relative or friend or a doctor or another health worker been concerned about your drinking or suggested you cut down?: No Alcohol Use Disorder Identification Test Final Score (AUDIT): 0 Brief Intervention: AUDIT score less than 7 or less-screening does not suggest unhealthy drinking-brief intervention not indicated Substance Abuse History in the last 12 months:  No. Consequences of Substance Abuse: NA Previous Psychotropic Medications: Yes  Psychological Evaluations: No  Past Medical History:  Past Medical History:  Diagnosis Date  . Hypertension   . Polymastia     Past Surgical History:  Procedure Laterality Date  . BRAIN SURGERY     Family History: History reviewed. No pertinent family history. Family Psychiatric  History: Reports that mother has "Bipolar and Schizophrenia."  Tobacco Screening: Have you used any form of tobacco in the last 30 days? (Cigarettes, Smokeless Tobacco, Cigars, and/or Pipes): No Social History:  History  Alcohol Use No     History  Drug Use  . Types: Marijuana    Comment: Episodic    Additional Social History:                           Allergies:  No Known Allergies Lab Results:  Results for orders placed or performed during the hospital encounter of 04/22/17 (from the past 48 hour(s))  Comprehensive metabolic panel     Status: Abnormal   Collection Time: 04/22/17 11:04 AM  Result Value Ref Range   Sodium 136 135 - 145 mmol/L   Potassium  3.0 (L) 3.5 - 5.1 mmol/L   Chloride 107 101 - 111 mmol/L   CO2 21 (L) 22 - 32 mmol/L   Glucose, Bld 95 65 - 99 mg/dL   BUN 8 6 - 20 mg/dL   Creatinine, Ser 0.60 0.44 - 1.00 mg/dL   Calcium 9.1 8.9 - 10.3 mg/dL   Total Protein 7.9 6.5 - 8.1 g/dL   Albumin 4.3 3.5 - 5.0 g/dL   AST 24 15 - 41 U/L   ALT 17 14 - 54 U/L   Alkaline Phosphatase 67 38 - 126 U/L   Total Bilirubin 0.4 0.3 - 1.2 mg/dL   GFR calc non Af Amer >60 >60 mL/min   GFR calc Af Amer >60 >60 mL/min    Comment: (NOTE) The eGFR has been calculated using the CKD EPI equation. This calculation has not been validated in all clinical situations. eGFR's persistently <60 mL/min signify possible Chronic Kidney Disease.    Anion gap 8 5 - 15  Ethanol     Status: None   Collection Time: 04/22/17 11:04 AM  Result Value Ref Range   Alcohol, Ethyl (B) <5 <5 mg/dL    Comment:        LOWEST DETECTABLE LIMIT FOR SERUM ALCOHOL IS 5 mg/dL FOR MEDICAL PURPOSES ONLY   Salicylate  level     Status: None   Collection Time: 04/22/17 11:04 AM  Result Value Ref Range   Salicylate Lvl <6.1 2.8 - 30.0 mg/dL  Acetaminophen level     Status: Abnormal   Collection Time: 04/22/17 11:04 AM  Result Value Ref Range   Acetaminophen (Tylenol), Serum <10 (L) 10 - 30 ug/mL    Comment:        THERAPEUTIC CONCENTRATIONS VARY SIGNIFICANTLY. A RANGE OF 10-30 ug/mL MAY BE AN EFFECTIVE CONCENTRATION FOR MANY PATIENTS. HOWEVER, SOME ARE BEST TREATED AT CONCENTRATIONS OUTSIDE THIS RANGE. ACETAMINOPHEN CONCENTRATIONS >150 ug/mL AT 4 HOURS AFTER INGESTION AND >50 ug/mL AT 12 HOURS AFTER INGESTION ARE OFTEN ASSOCIATED WITH TOXIC REACTIONS.   cbc     Status: Abnormal   Collection Time: 04/22/17 11:04 AM  Result Value Ref Range   WBC 5.7 4.0 - 10.5 K/uL   RBC 4.93 3.87 - 5.11 MIL/uL   Hemoglobin 15.7 (H) 12.0 - 15.0 g/dL   HCT 44.2 36.0 - 46.0 %   MCV 89.7 78.0 - 100.0 fL   MCH 31.8 26.0 - 34.0 pg   MCHC 35.5 30.0 - 36.0 g/dL   RDW 14.6 11.5 -  15.5 %   Platelets 318 150 - 400 K/uL  Rapid urine drug screen (hospital performed)     Status: Abnormal   Collection Time: 04/22/17 11:04 AM  Result Value Ref Range   Opiates NONE DETECTED NONE DETECTED   Cocaine NONE DETECTED NONE DETECTED   Benzodiazepines NONE DETECTED NONE DETECTED   Amphetamines NONE DETECTED NONE DETECTED   Tetrahydrocannabinol POSITIVE (A) NONE DETECTED   Barbiturates NONE DETECTED NONE DETECTED    Comment:        DRUG SCREEN FOR MEDICAL PURPOSES ONLY.  IF CONFIRMATION IS NEEDED FOR ANY PURPOSE, NOTIFY LAB WITHIN 5 DAYS.        LOWEST DETECTABLE LIMITS FOR URINE DRUG SCREEN Drug Class       Cutoff (ng/mL) Amphetamine      1000 Barbiturate      200 Benzodiazepine   443 Tricyclics       154 Opiates          300 Cocaine          300 THC              50   Pregnancy, urine     Status: None   Collection Time: 04/22/17 11:04 AM  Result Value Ref Range   Preg Test, Ur NEGATIVE NEGATIVE    Comment:        THE SENSITIVITY OF THIS METHODOLOGY IS >20 mIU/mL.     Blood Alcohol level:  Lab Results  Component Value Date   ETH <5 00/86/7619    Metabolic Disorder Labs:  Lab Results  Component Value Date   HGBA1C 5.3 01/09/2008   No results found for: PROLACTIN Lab Results  Component Value Date   CHOL 220 (H) 05/19/2010   TRIG 121 05/19/2010   HDL 43 05/19/2010   CHOLHDL 5.1 Ratio 05/19/2010   VLDL 24 05/19/2010   LDLCALC 153 (H) 05/19/2010   LDLCALC 185 (H) 01/27/2009    Current Medications: Current Facility-Administered Medications  Medication Dose Route Frequency Provider Last Rate Last Dose  . acetaminophen (TYLENOL) tablet 650 mg  650 mg Oral Q4H PRN Okonkwo, Justina A, NP   650 mg at 04/23/17 0900  . alum & mag hydroxide-simeth (MAALOX/MYLANTA) 200-200-20 MG/5ML suspension 30 mL  30 mL Oral Q4H PRN Okonkwo, Justina A,  NP      . hydrOXYzine (ATARAX/VISTARIL) tablet 25 mg  25 mg Oral TID PRN Lu Duffel, Justina A, NP   25 mg at 04/23/17 0118   . lisinopril (PRINIVIL,ZESTRIL) tablet 10 mg  10 mg Oral Daily Okonkwo, Justina A, NP   10 mg at 04/23/17 0856  . magnesium hydroxide (MILK OF MAGNESIA) suspension 30 mL  30 mL Oral Daily PRN Okonkwo, Justina A, NP      . nicotine (NICODERM CQ - dosed in mg/24 hours) patch 21 mg  21 mg Transdermal Daily Okonkwo, Justina A, NP      . ondansetron (ZOFRAN) tablet 4 mg  4 mg Oral Q8H PRN Okonkwo, Justina A, NP      . pantoprazole (PROTONIX) EC tablet 40 mg  40 mg Oral Daily Okonkwo, Justina A, NP   40 mg at 04/23/17 0856  . predniSONE (DELTASONE) tablet 15 mg  15 mg Oral Daily Okonkwo, Justina A, NP   15 mg at 04/23/17 0856  . traZODone (DESYREL) tablet 50 mg  50 mg Oral QHS PRN,MR X 1 Lindon Romp A, NP   50 mg at 04/23/17 0118  . valACYclovir (VALTREX) tablet 500 mg  500 mg Oral Daily Okonkwo, Justina A, NP   500 mg at 04/23/17 0856  . zolpidem (AMBIEN) tablet 5 mg  5 mg Oral QHS PRN Lu Duffel, Justina A, NP       PTA Medications: Prescriptions Prior to Admission  Medication Sig Dispense Refill Last Dose  . HYDROcodone-acetaminophen (NORCO) 7.5-325 MG tablet Take 1 tablet by mouth 2 (two) times daily as needed for moderate pain.   04/21/2017 at Unknown time  . lisinopril (PRINIVIL,ZESTRIL) 10 MG tablet Take 10 mg by mouth daily.    04/21/2017 at Unknown time  . Methotrexate, PF, 25 MG/0.4ML SOAJ Inject 25 mg into the skin every Wednesday.    Past Week at Unknown time  . Multiple Vitamins-Minerals (ONE-A-DAY VITACRAVES ADULT) CHEW Chew 1 each by mouth daily.   04/21/2017 at Unknown time  . omeprazole (PRILOSEC) 40 MG capsule Take 40 mg by mouth daily.   04/21/2017 at Unknown time  . predniSONE (DELTASONE) 5 MG tablet Take 15 mg by mouth daily.   04/21/2017 at Unknown time  . rosuvastatin (CRESTOR) 5 MG tablet Take 5 mg by mouth daily.   04/21/2017 at Unknown time  . valACYclovir (VALTREX) 500 MG tablet Take 500 mg by mouth daily.   04/21/2017 at Unknown time    Musculoskeletal: Strength & Muscle Tone:  decreased Gait & Station: normal Patient leans: N/A  Psychiatric Specialty Exam: Physical Exam  Constitutional:  Physical exam findings reviewed per documentation from the [facility] ED from 04/23/2017.     Review of Systems  Psychiatric/Behavioral: Positive for depression and suicidal ideas. Negative for hallucinations, memory loss and substance abuse. The patient is nervous/anxious and has insomnia.     Blood pressure 120/74, pulse 86, temperature 98.4 F (36.9 C), temperature source Oral, resp. rate 16, height 5' 9"  (1.753 m), weight 79.4 kg (175 lb), last menstrual period 03/29/2017, SpO2 100 %.Body mass index is 25.84 kg/m.  General Appearance: Fairly Groomed  Eye Contact:  Good  Speech:  Normal Rate  Volume:  Normal  Mood:  Depressed  Affect:  Constricted and Tearful  Thought Process:  Coherent and Descriptions of Associations: Intact  Orientation:  Full (Time, Place, and Person)  Thought Content:  Logical  Suicidal Thoughts:  No but prior to admission did think about driving car off  a bridge   Homicidal Thoughts:  No  Memory:  Immediate;   Good Recent;   Good Remote;   Good  Judgement:  Fair  Insight:  Fair  Psychomotor Activity:  Normal  Concentration:  Concentration: Good and Attention Span: Good  Recall:  Good  Fund of Knowledge:  Good  Language:  Good  Akathisia:  No  Handed:  Right  AIMS (if indicated):     Assets:  Communication Skills Desire for Improvement Leisure Time Resilience Social Support Talents/Skills  ADL's:  Intact  Cognition:  WNL  Sleep:  Number of Hours: 2    Treatment Plan Summary: Daily contact with patient to assess and evaluate symptoms and progress in treatment and Medication management  Observation Level/Precautions:  15 minute checks  Laboratory:  CBC Chemistry Profile UDS pregnancy test   Psychotherapy:  Individual and Group   Medications:  Start Cymbalta 20 mg daily for depression and anxiety in addition to possible pain  properties   Consultations:  As needed  Discharge Concerns:  Emotional stability   Estimated LOS: 2-5 days  Other:     Physician Treatment Plan for Primary Diagnosis: MDD (major depressive disorder) Long Term Goal(s): Improvement in symptoms so as ready for discharge  Short Term Goals: Ability to identify changes in lifestyle to reduce recurrence of condition will improve, Ability to verbalize feelings will improve, Ability to disclose and discuss suicidal ideas, Ability to demonstrate self-control will improve, Ability to identify and develop effective coping behaviors will improve, Ability to maintain clinical measurements within normal limits will improve and Compliance with prescribed medications will improve  Physician Treatment Plan for Secondary Diagnosis: Principal Problem:   MDD (major depressive disorder)  Long Term Goal(s): Improvement in symptoms so as ready for discharge  I certify that inpatient services furnished can reasonably be expected to improve the patient's condition.    Elmarie Shiley, NP 8/27/201811:55 AM   I have reviewed case with NP and have met with patient Agree with NP assessment  41 year old female. Was living with GF. She has one daughter aged 67. Currently on disability- reports she has been diagnosed with polymyositis. Denies legal issues .   Reports recent argument with GF- states " I blew up", and reports episode of agitation, where she broke TV, some furniture. States " I have been depressed, and she was upset because I was closed in, it escalated, and I lost it". States that she suffers from depression but also has episodes of angry outbursts .  Reports she has been struggling with depression, sadness, with thoughts of suicide . ( Denies any specific plan or intention). States " I have also been irritable , blowing up easy with people", and describes vague HI, but denies any specific violent or homicidal ideations towards anyone specific.  Denies  hallucinations or psychotic symptoms.  No prior psychiatric admissions, states she attempted suicide at age 58 by overdosing, reports history of depression, reports history of frequent angry outbursts during which she sometimes breaks items, of short duration. Reports she tried Zoloft briefly years ago, does not feel it helped, no other psychiatric medication trials .  Medical History remarkable for polymyositis - takes Prednisone/Metotrexate injecition on a regular basis for several years.  Reports daily cannabis abuse, denies alcohol or other drug abuse   Dx. MDD versus Depression secondary to Medical Illness, Cannabis Dependence. Consider Intermittent Explosive Disorder .  Plan- inpatient admission- start Cymbalta 20 mgrs QDAY for depression and anxiety. Reports she has a  history of taking Ambien for insomnia, with good results and no side effects. Patient reports received K+ supplement in ED, will repeat BMP to follow.

## 2017-04-23 NOTE — Progress Notes (Signed)
Recreation Therapy Notes  Date: 04/23/17 Time: 0915 Location: [address] Group Room  Group Topic: Stress Management  Goal Area(s) Addresses:  Patient will verbalize importance of using healthy stress management.  Patient will identify positive emotions associated with healthy stress management.   Intervention: Stress Management  Activity :  Body Scan Meditation.  LRT introduced the stress management technique of meditation.  LRT played a meditation from the Calm app to allow patients to take inventory of any tension, calmness or other sensations they may have been feeling.  Patients were to follow along as the meditation played.  Education:  Stress Management, Discharge Planning.   Education Outcome: Acknowledges edcuation/In group clarification offered/Needs additional education  Clinical Observations/Feedback: Pt did not attend group.    Caroll Rancher, LRT/CTRS         Caroll Rancher A 04/23/2017 11:52 AM

## 2017-04-23 NOTE — Social Work (Signed)
Referred to [facility], is [place] resident.  Key Cen, LCSW Lead Clinical Social Worker Phone:  [phone]  

## 2017-04-24 LAB — BASIC METABOLIC PANEL
ANION GAP: 8 (ref 5–15)
BUN: 13 mg/dL (ref 6–20)
CALCIUM: 9.1 mg/dL (ref 8.9–10.3)
CO2: 22 mmol/L (ref 22–32)
CREATININE: 0.62 mg/dL (ref 0.44–1.00)
Chloride: 109 mmol/L (ref 101–111)
Glucose, Bld: 98 mg/dL (ref 65–99)
Potassium: 3.5 mmol/L (ref 3.5–5.1)
SODIUM: 139 mmol/L (ref 135–145)

## 2017-04-24 MED ORDER — DULOXETINE HCL 20 MG PO CPEP
40.0000 mg | ORAL_CAPSULE | Freq: Every day | ORAL | Status: DC
  Administered 2017-04-25: 40 mg via ORAL
  Filled 2017-04-24 (×3): qty 2

## 2017-04-24 NOTE — Progress Notes (Signed)
[facility] MD Progress Note  04/24/2017 8:57 AM [name]  MRN:  [id number] Subjective:  Patient states " I am feeling better", which she attributes in part to sleeping better last night. Denies medication side effects. Denies suicidal ideations.  States she had a good visit from her daughter today. States she has spoken with her GF on phone and feels supported by her " she wants me to get better ". Objective : I have discussed case with treatment team and have met with patient . Patient reports she is feeling better, less depressed, and does present with improving mood and with a more reactive affect, not tearful today.She has had no explosive or angry outbursts on unit.  She has been visible on unit, going to groups Thus far tolerating medications well, denies medication side effects . Labs reviewed- K+ normalized - 3.5   Principal Problem: MDD (major depressive disorder) Diagnosis:   Patient Active Problem List   Diagnosis Date Noted  . MDD (major depressive disorder) [F32.9] 04/23/2017  . Low bone mass [M85.80] 01/30/2014  . COMMON VARIABLE IMMUNODEFICIENCY [D83.9] 05/25/2010  . INFECTION, SKIN AND SOFT TISSUE [L08.9] 05/19/2010  . CONJUNCTIVITIS, ALLERGIC [H10.45] 07/29/2009  . SKIN RASH [R21] 06/17/2009  . TINEA VERSICOLOR [B36.0] 04/14/2009  . NASOPHARYNGITIS [J00] 04/14/2009  . DERMATOMYOSITIS [M33.90] 02/25/2009  . CANDIDIASIS [B37.9] 02/19/2009  . INGROWN NAIL [L60.0] 02/01/2009  . HYPERCHOLESTEROLEMIA [E78.00] 01/28/2009  . RENAL CALCULUS, RIGHT [N20.0] 06/18/2008  . HYPERTENSION, BENIGN ESSENTIAL [I10] 06/16/2008  . POLYMYOSITIS [M33.20] 06/16/2008  . INSOMNIA [G47.00] 02/19/2008  . DERMATOMYCOSIS [B36.9] 01/08/2008  . CHIARI MALFORMATION [G93.5] 11/20/2007  . WEAKNESS, LEFT SIDE OF BODY [G81.90] 11/18/2007  . DISC DISEASE, LUMBAR [M51.37] 11/18/2007  . MEMORY LOSS [R41.3] 11/18/2007  . BACK PAIN [M54.9] 11/11/2007  . ARM NUMBNESS [R20.9] 11/11/2007   Total Time  spent with patient: 20 minutes   Past Medical History:  Past Medical History:  Diagnosis Date  . Hypertension   . Polymastia     Past Surgical History:  Procedure Laterality Date  . BRAIN SURGERY     Family History: History reviewed. No pertinent family history. Social History:  History  Alcohol Use No     History  Drug Use  . Types: Marijuana    Comment: Episodic    Social History   Social History  . Marital status: Single    Spouse name: N/A  . Number of children: N/A  . Years of education: N/A   Social History Main Topics  . Smoking status: Never Smoker  . Smokeless tobacco: Never Used  . Alcohol use No  . Drug use: Yes    Types: Marijuana     Comment: Episodic  . Sexual activity: Yes   Other Topics Concern  . None   Social History Narrative  . None   Additional Social History:   Sleep: improving   Appetite:  Good  Current Medications: Current Facility-Administered Medications  Medication Dose Route Frequency Provider Last Rate Last Dose  . acetaminophen (TYLENOL) tablet 650 mg  650 mg Oral Q4H PRN Okonkwo, Justina A, NP   650 mg at 04/23/17 0900  . alum & mag hydroxide-simeth (MAALOX/MYLANTA) 200-200-20 MG/5ML suspension 30 mL  30 mL Oral Q4H PRN Okonkwo, Justina A, NP      . docusate sodium (COLACE) capsule 100 mg  100 mg Oral Daily Niel Hummer, NP      . DULoxetine (CYMBALTA) DR capsule 20 mg  20 mg Oral Daily Cobos, Felicita Gage  A, MD   20 mg at 04/23/17 1259  . HYDROcodone-acetaminophen (NORCO/VICODIN) 5-325 MG per tablet 1.5 tablet  1.5 tablet Oral Q12H PRN Elmarie Shiley A, NP      . hydrOXYzine (ATARAX/VISTARIL) tablet 25 mg  25 mg Oral TID PRN Lu Duffel, Justina A, NP   25 mg at 04/23/17 0118  . lisinopril (PRINIVIL,ZESTRIL) tablet 10 mg  10 mg Oral Daily Okonkwo, Justina A, NP   10 mg at 04/23/17 0856  . magnesium hydroxide (MILK OF MAGNESIA) suspension 30 mL  30 mL Oral Daily PRN Okonkwo, Justina A, NP   30 mL at 04/23/17 1633  . nicotine  (NICODERM CQ - dosed in mg/24 hours) patch 21 mg  21 mg Transdermal Daily Okonkwo, Justina A, NP      . ondansetron (ZOFRAN) tablet 4 mg  4 mg Oral Q8H PRN Okonkwo, Justina A, NP      . pantoprazole (PROTONIX) EC tablet 40 mg  40 mg Oral Daily Okonkwo, Justina A, NP   40 mg at 04/23/17 0856  . predniSONE (DELTASONE) tablet 15 mg  15 mg Oral Daily Okonkwo, Justina A, NP   15 mg at 04/23/17 0856  . valACYclovir (VALTREX) tablet 500 mg  500 mg Oral Daily Okonkwo, Justina A, NP   500 mg at 04/23/17 0856  . zolpidem (AMBIEN) tablet 5 mg  5 mg Oral QHS PRN Hughie Closs A, NP   5 mg at 04/23/17 2300    Lab Results:  Results for orders placed or performed during the hospital encounter of 04/23/17 (from the past 48 hour(s))  Basic metabolic panel     Status: None   Collection Time: 04/24/17  6:21 AM  Result Value Ref Range   Sodium 139 135 - 145 mmol/L   Potassium 3.5 3.5 - 5.1 mmol/L   Chloride 109 101 - 111 mmol/L   CO2 22 22 - 32 mmol/L   Glucose, Bld 98 65 - 99 mg/dL   BUN 13 6 - 20 mg/dL   Creatinine, Ser 0.62 0.44 - 1.00 mg/dL   Calcium 9.1 8.9 - 10.3 mg/dL   GFR calc non Af Amer >60 >60 mL/min   GFR calc Af Amer >60 >60 mL/min    Comment: (NOTE) The eGFR has been calculated using the CKD EPI equation. This calculation has not been validated in all clinical situations. eGFR's persistently <60 mL/min signify possible Chronic Kidney Disease.    Anion gap 8 5 - 15    Comment: Performed at [facility], [place] [address] [place], [place] [address]    Blood Alcohol level:  Lab Results  Component Value Date   ETH <5 23/95/3202    Metabolic Disorder Labs: Lab Results  Component Value Date   HGBA1C 5.3 01/09/2008   No results found for: PROLACTIN Lab Results  Component Value Date   CHOL 220 (H) 05/19/2010   TRIG 121 05/19/2010   HDL 43 05/19/2010   CHOLHDL 5.1 Ratio 05/19/2010   VLDL 24 05/19/2010   LDLCALC 153 (H) 05/19/2010   LDLCALC 185 (H) 01/27/2009     Physical Findings: AIMS: Facial and Oral Movements Muscles of Facial Expression: None, normal Lips and Perioral Area: None, normal Jaw: None, normal Tongue: None, normal,Extremity Movements Upper (arms, wrists, hands, fingers): None, normal Lower (legs, knees, ankles, toes): None, normal, Trunk Movements Neck, shoulders, hips: None, normal, Overall Severity Severity of abnormal movements (highest score from questions above): None, normal Incapacitation due to abnormal movements: None, normal Patient's awareness  of abnormal movements (rate only patient's report): No Awareness, Dental Status Current problems with teeth and/or dentures?: No Does patient usually wear dentures?: No  CIWA:    COWS:     Musculoskeletal: Strength & Muscle Tone: within normal limits Gait & Station: normal- walks with walker due to history of polymyositis  Patient leans: N/A  Psychiatric Specialty Exam: Physical Exam  ROS denies headache , no chest pain, no shortness of breath, no vomiting   Blood pressure 129/88, pulse 92, temperature 98.3 F (36.8 C), temperature source Oral, resp. rate 16, height _0  (1.753 m), weight 79.4 kg (175 lb), last menstrual period 03/29/2017, SpO2 100 %.Body mass index is 25.84 kg/m.  General Appearance: improved grooming   Eye Contact:  Good  Speech:  Normal Rate  Volume:  Normal  Mood:  improved, reports she is sleeping " better"  Affect:  Appropriate and more reactive   Thought Process:  Linear and Descriptions of Associations: Intact  Orientation:  Full (Time, Place, and Person)  Thought Content:  denies hallucinations, no delusions, not internally preoccupied   Suicidal Thoughts:  No denies suicidal or self injurious ideations, no homicidal or violent ideations  Homicidal Thoughts:  No  Memory:  recent and remote grossly intact   Judgement:  Other:  improving   Insight:  improving   Psychomotor Activity:  Normal- walks with walker   Concentration:   Concentration: Good and Attention Span: Good  Recall:  Good  Fund of Knowledge:  Good  Language:  Good  Akathisia:  Negative  Handed:  Right  AIMS (if indicated):     Assets:  Communication Skills Desire for Improvement Resilience  ADL's:  Intact  Cognition:  WNL  Sleep:  Number of Hours: 6.25   Assessment : patient presents improved compared to admission- reports she is feeling better, less depressed, with improving range of affect,  denies suicidal ideations, and at this time is not presenting with any irritability or explosiveness. Thus far tolerating medications well   Treatment Plan Summary: Daily contact with patient to assess and evaluate symptoms and progress in treatment, Medication management, Plan inpatient admission  and medications as below  Encourage group and milieu participation to work on coping skills and symptom reduction Treatment team working on disposition planning  Increase Cymbalta to 40 mgrs QDAY for depression, anxiety,  pain Continue Ambien 5 mgrs QHS PRN for insomnia as needed  Continue Vistaril 25 mgrs Q 8 hours PRN for anxiety Continue Prednisone for history of polymyositis   Jenne Campus, MD 04/24/2017, 8:57 AM

## 2017-04-24 NOTE — Progress Notes (Signed)
Pt reports that she is doing some better this evening.  The previous shift reported that pt has been tearful today, but this evening no tearfulness has been noted.  She has been talking on the phone quite frequently.  She still has passive suicidal thoughts, but can contract for safety.  She denies HI/AVH.  Pt says she still has a lot of issues to work on, but hopes that being in the hospital will help her face her problems.  She was pleasant and cooperative with Clinical research associate.  Pt requested a sleep aid which was given at bedtime.  Support and encouragement offered.  Discharge plans are in process. Safety maintained with q15 minute checks.

## 2017-04-24 NOTE — B[facility] Group Notes (Signed)
LCSW Group Therapy Note  04/24/2017 1:15pm  Type of Therapy/Topic:  Group Therapy:  Feelings about Diagnosis  Participation Level: Active  Description of Group:   This group will allow patients to explore their thoughts and feelings about diagnoses they have received. Patients will be guided to explore their level of understanding and acceptance of these diagnoses. Facilitator will encourage patients to process their thoughts and feelings about the reactions of others to their diagnosis and will guide patients in identifying ways to discuss their diagnosis with significant others in their lives. This group will be process-oriented, with patients participating in exploration of their own experiences, giving and receiving support, and processing challenge from other group members.   Therapeutic Goals: 1. Patient will demonstrate understanding of diagnosis as evidenced by identifying two or more symptoms of the disorder 2. Patient will be able to express two feelings regarding the diagnosis 3. Patient will demonstrate their ability to communicate their needs through discussion and/or role play  Summary of Patient Progress: Pt was present and engaged for the duration of the group. Pt gave appropriate feedback to her peers and contributed to the discussion. Pt wrote a letter to her diagnosis and states that she would like to "get her life back".    Therapeutic Modalities:   Cognitive Behavioral Therapy Brief Therapy Feelings Identification    Jonathon Jordan, MSW, LCSWA 04/24/2017 3:20 PM

## 2017-04-24 NOTE — Progress Notes (Signed)
Patient ID: [name], female   DOB: 10-Aug-1976, 42 y.o.   MRN: [id number] D:Affect is appropriate to mood. Depressed/sad at times. Rates both her depression and anxiety as a 2 today. Admits to feeling sad but is currently not having self harm thoughts.  Visible in the milieu and has attended groups and activities today.A:Support and encouragement offered. R:Receptive. No complaints of pain or problems at this time.

## 2017-04-24 NOTE — BHH Group Notes (Signed)
B[facility] Group Notes:  (Nursing/MHT/Case Management/Adjunct)  Date:  04/24/2017  Time:  1030 am Type of Therapy:  Nurse Education  Participation Level:  Active  Participation Quality:  Appropriate and Sharing  Affect:  Depressed and Flat  Cognitive:  Appropriate  Insight:  Good  Engagement in Group:  Engaged and Supportive  Modes of Intervention:  Education and Support  Summary of Progress/Problems: States that she is working on Pharmacologist for her anger  Ottie Glazier 04/24/2017, 6:39 PM

## 2017-04-25 MED ORDER — HYDROXYZINE HCL 25 MG PO TABS: 25.0000 mg | ORAL_TABLET | Freq: Three times a day (TID) | ORAL | 0 refills | Status: DC | PRN

## 2017-04-25 MED ORDER — DULOXETINE HCL 40 MG PO CPEP: 40.0000 mg | ORAL_CAPSULE | Freq: Every day | ORAL | 0 refills | Status: DC

## 2017-04-25 NOTE — Progress Notes (Signed)
Pt d/c from the hospital with a friend. All items returned. D/C instructions given and prescriptions given. Pt denies si and hi.   

## 2017-04-25 NOTE — Progress Notes (Signed)
  [facility] :  Will you be returning to the same living situation after discharge:  Yes,  pt returning home. At discharge, do you have transportation home?: Yes,  pt's daughter will transport. Do you have the ability to pay for your medications: Yes,  pt has insurance.  Release of information consent forms completed and in the chart;  Patient's signature needed at discharge.  Patient to Follow up at: Follow-up Information    BEHAVIORAL HEALTH OUTPATIENT THERAPY [place] Follow up on 05/01/2017.   Specialty:  Behavioral Health Why:  Initial assessment for mental health IOP program on 9/4 at 8:45 AM.  Please bring photo ID, insurance card, hospital discharge paperwork.  Call to cancel/reschedule if needed.   Contact information: [address] [id number] [place]c [place] [phone]       [facility] Follow up on 05/01/2017.   Specialty:  Family Medicine Why:  Initial assessment for services on 9/4 at 2 PM, psychiatric assessment/medications management on 9/4 at 4 PM.  Please call to cancel/reschedule if needed.  At these appointments, you may ask to be linked w a primary care provider at this facility. Contact information: [address] [place] [id number] [phone]           Next level of care provider [facility] Link:no  Safety Planning and Suicide Prevention discussed: Yes, with pt and with pt's support person.  Have you used any form of tobacco in the last 30 days? (Cigarettes, Smokeless Tobacco, Cigars, and/or Pipes): No  Has patient been referred to the Quitline?: N/A patient is not a smoker  Patient has been referred for addiction treatment: Yes  Jonathon JordanLynn B Netty Sullivant, MSW, LCSWA 04/25/2017, 2:33 PM

## 2017-04-25 NOTE — Progress Notes (Signed)
D- Patient alert and oriented.  Patient currently denies SI, HI, AVH, and pain.   Patient reports that she had a "great day" and feels "better". Patient appears brighter this shift and is interacting more with others on the unit.  Patient reports the best part of her day was visitation and states "my girlfriend came to visit".  Patient is optimistic about her future and wants to continue therapy outpatient.  Patient reports that she has a "big support team" with her family and states "they will be relieving some of the burden with taking care of my mom".  Patient states "I'm happy.  I am ready to do all I can to stay how I am. I cant let the past hold my future".  Patient had complaints of indigestion and was given PRN medication per MD order.  Medication was effective in offering relief. No other complaints.  Patient verbalizes readiness for discharge.    A- Scheduled and PRN medications administered to patient, per MD orders. Support and encouragement provided.  Routine safety checks conducted every 15 minutes.  Patient informed to notify staff with problems or concerns. R- No adverse drug reactions noted. Patient contracts for safety at this time. Patient compliant with medications and treatment plan. Patient receptive, calm, and cooperative. Patient interacts well with others on the unit.  Patient remains safe at this time.

## 2017-04-25 NOTE — Progress Notes (Signed)
Recreation Therapy Notes  Date:  04/25/17 Time: 0930 Location: [address] Dayroom  Group Topic: Stress Management  Goal Area(s) Addresses:  Patient will verbalize importance of using healthy stress management.  Patient will identify positive emotions associated with healthy stress management.   Intervention: Stress Management  Activity :  Guided Imagery.  LRT introduced the stress management technique of guided imagery.  LRT read a script so patients could follow along and imagine being in their peaceful place.  Patients were to follow along as LRT read the script to engage in the activity.  Education: Stress Management, Discharge Planning.   Education Outcome: Acknowledges edcuation/In group clarification offered/Needs additional education  Clinical Observations/Feedback:  Pt did not attend group.    Caroll RancherMarjette Sahiba Granholm, LRT/CTRS        Caroll RancherLindsay, Lakie Mclouth A 04/25/2017 12:20 PM

## 2017-04-25 NOTE — [facility] Suicide Risk Assessment (Signed)
[facility]   Principal Problem: MDD (major depressive disorder) Discharge Diagnoses:  Patient Active Problem List   Diagnosis Date Noted  . MDD (major depressive disorder) [F32.9] 04/23/2017  . Low bone mass [M85.80] 01/30/2014  . COMMON VARIABLE IMMUNODEFICIENCY [D83.9] 05/25/2010  . INFECTION, SKIN AND SOFT TISSUE [L08.9] 05/19/2010  . CONJUNCTIVITIS, ALLERGIC [H10.45] 07/29/2009  . SKIN RASH [R21] 06/17/2009  . TINEA VERSICOLOR [B36.0] 04/14/2009  . NASOPHARYNGITIS [J00] 04/14/2009  . DERMATOMYOSITIS [M33.90] 02/25/2009  . CANDIDIASIS [B37.9] 02/19/2009  . INGROWN NAIL [L60.0] 02/01/2009  . HYPERCHOLESTEROLEMIA [E78.00] 01/28/2009  . RENAL CALCULUS, RIGHT [N20.0] 06/18/2008  . HYPERTENSION, BENIGN ESSENTIAL [I10] 06/16/2008  . POLYMYOSITIS [M33.20] 06/16/2008  . INSOMNIA [G47.00] 02/19/2008  . DERMATOMYCOSIS [B36.9] 01/08/2008  . CHIARI MALFORMATION [G93.5] 11/20/2007  . WEAKNESS, LEFT SIDE OF BODY [G81.90] 11/18/2007  . DISC DISEASE, LUMBAR [M51.37] 11/18/2007  . MEMORY LOSS [R41.3] 11/18/2007  . BACK PAIN [M54.9] 11/11/2007  . ARM NUMBNESS [R20.9] 11/11/2007    Total Time spent with patient: 30 minutes  Musculoskeletal: Strength & Muscle Tone: decreased- history of polymyositis  Gait & Station: normal- walks with walker or cane- states that ambulation has improved significantly over time Patient leans: N/A  Psychiatric Specialty Exam: ROS denies headache, no chest pain, no shortness of breath, no vomiting   Blood pressure (!) 141/103, pulse 76, temperature 98.5 F (36.9 C), temperature source Oral, resp. rate 16, height 5\' 9"  (1.753 m), weight 79.4 kg (175 lb), last menstrual period 03/29/2017, SpO2 100 %.Body mass index is 25.84 kg/m.  Repeat BP 139/95, pusle 74   General Appearance: Well Groomed  Eye Contact::  Good  Speech:  Normal Rate409  Volume:  Normal  Mood:  reports improved mood and presents euthymic at this time  Affect:   Appropriate and Full Range  Thought Process:  Linear and Descriptions of Associations: Intact  Orientation:  Full (Time, Place, and Person)  Thought Content:  no hallucinations, no delusions, not internally preoccupied  Suicidal Thoughts:  No denies any suicidal or self injurious ideations, no homicidal or violent ideations   Homicidal Thoughts:  No  Memory:  recent and remote grossly intact   Judgement:  Other:  improving   Insight:  improving   Psychomotor Activity:  Normal  Concentration:  Good  Recall:  Good  Fund of Knowledge:Good  Language: Good  Akathisia:  Negative  Handed:  Right  AIMS (if indicated):     Assets:  Communication Skills Desire for Improvement Resilience  Sleep:  Number of Hours: 6.75  Cognition: WNL  ADL's:  Intact   Mental Status Per Nursing Assessment::   On Admission:  Suicidal ideation indicated by patient, Self-harm thoughts, Thoughts of violence towards others  Demographic Factors:  41 year old female , has one adult daughter, lives with GF, on disability  Loss Factors: Relationship strain, history of chronic medical illness ( polymyositis)   Historical Factors: No prior psychiatric admissions, history of depression, history of suicide attempt at age 41, history of short lived angry outbursts   Risk Reduction Factors:   Sense of responsibility to family and Positive coping skills or problem solving skills  Continued Clinical Symptoms:  At this time patient is alert, attentive,well related, pleasant, mood is reported as improved and presents euthymic, with a full range of affect , no thought disorder, no suicidal or self injurious ideations, no homicidal ideations, no psychotic symptoms . She is future oriented . States she had a good visit from [employer]  yesterday, and that she feels their relationship is now improved and more stable. Denies medication side effects. Of note, BP has tended to be elevated- patient denies associated symptoms, is on  Lisinopril. She is on chronic prednisone management due to history of autoimmune disorder, plans to follow up with PCP for ongoing monitoring and management.   Cognitive Features That Contribute To Risk:  No gross cognitive deficits noted upon discharge. Is alert , attentive, and oriented x 3  Suicide Risk:  Mild:  Suicidal ideation of limited frequency, intensity, duration, and specificity.  There are no identifiable plans, no associated intent, mild dysphoria and related symptoms, good self-control (both objective and subjective assessment), few other risk factors, and identifiable protective factors, including available and accessible social support.  Follow-up Information    BEHAVIORAL HEALTH OUTPATIENT THERAPY [place] Follow up on 05/01/2017.   Specialty:  Behavioral Health Why:  Initial assessment for mental health IOP program on 9/4 at 8:45 AM.  Please bring photo ID, insurance card, hospital discharge paperwork.  Call to cancel/reschedule if needed.   Contact information: [address] [id number] mc [place] [address] [phone]       [facility] Follow up on 05/01/2017.   Specialty:  Family Medicine Why:  Initial assessment for services on 9/4 at 2 PM, psychiatric assessment/medications management on 9/4 at 4 PM.  Please call to cancel/reschedule if needed.  At these appointments, you may ask to be linked w a primary care provider at this facility. Contact information: [address] [place] [address] [phone]           Plan Of Care/Follow-up recommendations:  Activity:  as tolerated  Diet:  regular Tests:  NA Other:  see below Patient is expressing readiness for discharge and is leaving unit in good spirits  States family member will pick her up later today Plans to return home Plans to follow up as above  Plans to follow up with her PCP, Specialist at [facility] in [place] for ongoing management of  medical illness.  Craige Cotta, MD 04/25/2017, 9:36 AM

## 2017-04-25 NOTE — Discharge Summary (Signed)
Physician Discharge Summary Note  Patient:  [name] is an 41 y.o., female MRN:  [id number] DOB:  1976-06-25 Patient phone:  [phone] (home)  Patient address:   [address] [place] [address],  Total Time spent with patient: 20 minutes  Date of Admission:  04/23/2017 Date of Discharge: 04/25/17   Reason for Admission:  Worsening depression with SI, explosive episode  Principal Problem: MDD (major depressive disorder) Discharge Diagnoses: Patient Active Problem List   Diagnosis Date Noted  . MDD (major depressive disorder) [F32.9] 04/23/2017  . Low bone mass [M85.80] 01/30/2014  . COMMON VARIABLE IMMUNODEFICIENCY [D83.9] 05/25/2010  . INFECTION, SKIN AND SOFT TISSUE [L08.9] 05/19/2010  . CONJUNCTIVITIS, ALLERGIC [H10.45] 07/29/2009  . SKIN RASH [R21] 06/17/2009  . TINEA VERSICOLOR [B36.0] 04/14/2009  . NASOPHARYNGITIS [J00] 04/14/2009  . DERMATOMYOSITIS [M33.90] 02/25/2009  . CANDIDIASIS [B37.9] 02/19/2009  . INGROWN NAIL [L60.0] 02/01/2009  . HYPERCHOLESTEROLEMIA [E78.00] 01/28/2009  . RENAL CALCULUS, RIGHT [N20.0] 06/18/2008  . HYPERTENSION, BENIGN ESSENTIAL [I10] 06/16/2008  . POLYMYOSITIS [M33.20] 06/16/2008  . INSOMNIA [G47.00] 02/19/2008  . DERMATOMYCOSIS [B36.9] 01/08/2008  . CHIARI MALFORMATION [G93.5] 11/20/2007  . WEAKNESS, LEFT SIDE OF BODY [G81.90] 11/18/2007  . DISC DISEASE, LUMBAR [M51.37] 11/18/2007  . MEMORY LOSS [R41.3] 11/18/2007  . BACK PAIN [M54.9] 11/11/2007  . ARM NUMBNESS [R20.9] 11/11/2007    Past Psychiatric History: MDD  Past Medical History:  Past Medical History:  Diagnosis Date  . Hypertension   . Polymastia     Past Surgical History:  Procedure Laterality Date  . BRAIN SURGERY     Family History: History reviewed. No pertinent family history. Family Psychiatric  History: Mother - Bipolar and Schizophrenia Social History:  History  Alcohol Use No     History  Drug Use  . Types: Marijuana    Comment:  Episodic    Social History   Social History  . Marital status: Single    Spouse name: N/A  . Number of children: N/A  . Years of education: N/A   Social History Main Topics  . Smoking status: Never Smoker  . Smokeless tobacco: Never Used  . Alcohol use No  . Drug use: Yes    Types: Marijuana     Comment: Episodic  . Sexual activity: Yes   Other Topics Concern  . None   Social History Narrative  . None    Hospital Course:  04/23/17: 41 year old female who was living with GF. She has one daughter aged 53. Currently on disability- reports she has been diagnosed with polymyositis. Denies legal issues . Reports recent argument with GF- states " I blew up", and reports episode of agitation, where she broke TV, some furniture. States " I have been depressed, and she was upset because I was closed in, it escalated, and I lost it". States that she suffers from depression but also has episodes of angry outbursts . Reports she has been struggling with depression, sadness, with thoughts of suicide . ( Denies any specific plan or intention). States " I have also been irritable , blowing up easy with people", and describes vague HI, but denies any specific violent or homicidal ideations towards anyone specific. Denies hallucinations or psychotic symptoms. No prior psychiatric admissions, states she attempted suicide at age 66 by overdosing, reports history of depression, reports history of frequent angry outbursts during which she sometimes breaks items, of short duration. Reports she tried Zoloft briefly years ago, does not feel it helped, no other psychiatric  medication trials . Medical History remarkable for polymyositis - takes Prednisone/Metotrexate injecition on a regular basis for several years. Reports daily cannabis abuse, denies alcohol or other drug abuse. Dx. MDD versus Depression secondary to Medical Illness, Cannabis Dependence. Consider Intermittent Explosive Disorder . Plan- inpatient  admission- start Cymbalta 20 mgrs QDAY for depression and anxiety. Reports she has a history of taking Ambien for insomnia, with good results and no side effects. Patient reports received K+ supplement in ED, will repeat BMP to follow. The [place] Controlled Substance database was reviewed as patient reports taking norco as needed for her muscle pain. She last filled norco 7.5-325 mg on 04/03/2017 that is actively being prescribed to her by Beaulah Corin MD.   Patient was started on Cymbalta and increased to 40 mg PO Daily and stabilized quickly.  04/23/17: Patient reports she is feeling better, less depressed, and does present with improving mood and with a more reactive affect, not tearful today.She has had no explosive or angry outbursts on unit. She has been visible on unit, going to groups. Thus far tolerating medications well, denies medication side effects.  04/24/17: At this time patient is alert, attentive,well related, pleasant, mood is reported as improved and presents euthymic, with a full range of affect , no thought disorder, no suicidal or self injurious ideations, no homicidal ideations, no psychotic symptoms . She is future oriented . States she had a good visit from GF yesterday, and that she feels their relationship is now improved and more stable. Denies medication side effects. Of note, BP has tended to be elevated- patient denies associated symptoms, is on Lisinopril. She is on chronic prednisone management due to history of autoimmune disorder, plans to follow up with PCP for ongoing monitoring and management.   Physical Findings: AIMS: Facial and Oral Movements Muscles of Facial Expression: None, normal Lips and Perioral Area: None, normal Jaw: None, normal Tongue: None, normal,Extremity Movements Upper (arms, wrists, hands, fingers): None, normal Lower (legs, knees, ankles, toes): None, normal, Trunk Movements Neck, shoulders, hips: None, normal, Overall Severity Severity  of abnormal movements (highest score from questions above): None, normal Incapacitation due to abnormal movements: None, normal Patient's awareness of abnormal movements (rate only patient's report): No Awareness, Dental Status Current problems with teeth and/or dentures?: No Does patient usually wear dentures?: No  CIWA:    COWS:     Musculoskeletal: Strength & Muscle Tone: decreased Gait & Station: unsteady Patient leans: N/A  Psychiatric Specialty Exam: Physical Exam  Nursing note and vitals reviewed. Constitutional: She is oriented to person, place, and time. She appears well-developed and well-nourished.  Cardiovascular: Normal rate.   Respiratory: Effort normal.  Musculoskeletal:  Patient using walker for ambulation  Neurological: She is alert and oriented to person, place, and time.  Skin: Skin is warm.    Review of Systems  Constitutional: Negative.   HENT: Negative.   Eyes: Negative.   Respiratory: Negative.   Cardiovascular: Negative.   Gastrointestinal: Negative.   Genitourinary: Negative.   Musculoskeletal: Positive for back pain and joint pain.  Skin: Negative.   Neurological: Negative.   Endo/Heme/Allergies: Negative.     Blood pressure (!) 137/97, pulse 74, temperature 98.5 F (36.9 C), temperature source Oral, resp. rate 16, height 5\' 9"  (1.753 m), weight 79.4 kg (175 lb), last menstrual period 03/29/2017, SpO2 100 %.Body mass index is 25.84 kg/m.  General Appearance: Casual  Eye Contact:  Good  Speech:  Clear and Coherent and Normal Rate  Volume:  Normal  Mood:  Euthymic  Affect:  Appropriate  Thought Process:  Coherent and Descriptions of Associations: Intact  Orientation:  Full (Time, Place, and Person)  Thought Content:  WDL  Suicidal Thoughts:  No  Homicidal Thoughts:  No  Memory:  Immediate;   Good Recent;   Good  Judgement:  Good  Insight:  Good  Psychomotor Activity:  Normal  Concentration:  Concentration: Good  Recall:  Good  Fund of  Knowledge:  Good  Language:  Good  Akathisia:  Negative  Handed:  Right  AIMS (if indicated):     Assets:  Financial Resources/Insurance Housing Social Support Transportation  ADL's:  Intact  Cognition:  WNL  Sleep:  Number of Hours: 6.75     Have you used any form of tobacco in the last 30 days? (Cigarettes, Smokeless Tobacco, Cigars, and/or Pipes): No  Has this patient used any form of tobacco in the last 30 days? (Cigarettes, Smokeless Tobacco, Cigars, and/or Pipes) Yes, No  Blood Alcohol level:  Lab Results  Component Value Date   ETH <5 04/22/2017    Metabolic Disorder Labs:  Lab Results  Component Value Date   HGBA1C 5.3 01/09/2008   No results found for: PROLACTIN Lab Results  Component Value Date   CHOL 220 (H) 05/19/2010   TRIG 121 05/19/2010   HDL 43 05/19/2010   CHOLHDL 5.1 Ratio 05/19/2010   VLDL 24 05/19/2010   LDLCALC 153 (H) 05/19/2010   LDLCALC 185 (H) 01/27/2009    See Psychiatric Specialty Exam and Suicide Risk Assessment completed by Attending Physician prior to discharge.  Discharge destination:  Home  Is patient on multiple antipsychotic therapies at discharge:  No   Has Patient had three or more failed trials of antipsychotic monotherapy by history:  No  Recommended Plan for Multiple Antipsychotic Therapies: NA   Allergies as of 04/25/2017   No Known Allergies     Medication List    STOP taking these medications   Methotrexate (PF) 25 MG/0.4ML Soaj     TAKE these medications     Indication  DULoxetine HCl 40 MG Cpep Take 40 mg by mouth daily. For mood control  Indication:  mood stability   HYDROcodone-acetaminophen 7.5-325 MG tablet Commonly known as:  NORCO Take 1 tablet by mouth 2 (two) times daily as needed for moderate pain.  Indication:  Moderate to Moderately Severe Pain   hydrOXYzine 25 MG tablet Commonly known as:  ATARAX/VISTARIL Take 1 tablet (25 mg total) by mouth 3 (three) times daily as needed for anxiety.   Indication:  Feeling Anxious   lisinopril 10 MG tablet Commonly known as:  PRINIVIL,ZESTRIL Take 10 mg by mouth daily.  Indication:  High Blood Pressure Disorder   omeprazole 40 MG capsule Commonly known as:  PRILOSEC Take 40 mg by mouth daily.  Indication:  Gastroesophageal Reflux Disease   ONE-A-DAY VITACRAVES ADULT Chew Chew 1 each by mouth daily.  Indication:  Preventative   predniSONE 5 MG tablet Commonly known as:  DELTASONE Take 15 mg by mouth daily.  Indication:  Per medical provider orders   rosuvastatin 5 MG tablet Commonly known as:  CRESTOR Take 5 mg by mouth daily.  Indication:  High Amount of Fats in the Blood   valACYclovir 500 MG tablet Commonly known as:  VALTREX Take 500 mg by mouth daily.  Indication:  Herpes Simplex Infection      Follow-up Information    BEHAVIORAL HEALTH OUTPATIENT THERAPY  Follow up on 05/01/2017.  Specialty:  Behavioral Health Why:  Initial assessment for mental health IOP program on 9/4 at 8:45 AM.  Please bring photo ID, insurance card, hospital discharge paperwork.  Call to cancel/reschedule if needed.   Contact information: [address] [id number] mc [place] [address] [phone]       [facility] Follow up on 05/01/2017.   Specialty:  Family Medicine Why:  Initial assessment for services on 9/4 at 2 PM, psychiatric assessment/medications management on 9/4 at 4 PM.  Please call to cancel/reschedule if needed.  At these appointments, you may ask to be linked w a primary care provider at this facility. Contact information: [address] [place]ella R[place] [address] [phone]           Follow-up recommendations:  Continue activity as tolerated. Continue diet as recommended by your PCP. Ensure to keep all appointments with outpatient providers.  Comments:  Patient is instructed prior to discharge to: Take all medications as prescribed by his/her  mental healthcare provider. Report any adverse effects and or reactions from the medicines to his/her outpatient provider promptly. Patient has been instructed & cautioned: To not engage in alcohol and or illegal drug use while on prescription medicines. In the event of worsening symptoms, patient is instructed to call the crisis hotline, 911 and or go to the nearest ED for appropriate evaluation and treatment of symptoms. To follow-up with his/her primary care provider for your other medical issues, concerns and or health care needs.    Signed: Gerlene Burdock Money, FNP 04/25/2017, 11:05 AM   Patient seen, Suicide Assessment Completed.  Disposition Plan Reviewed

## 2017-04-25 NOTE — Tx Team (Signed)
Interdisciplinary Treatment and Diagnostic Plan Update 04/25/2017 Time of Session: 9:30am  [name]  MRN: [id number]  Principal Diagnosis: MDD (major depressive disorder)  Secondary Diagnoses: Principal Problem:   MDD (major depressive disorder)   Current Medications:  Current Facility-Administered Medications  Medication Dose Route Frequency Provider Last Rate Last Dose  . acetaminophen (TYLENOL) tablet 650 mg  650 mg Oral Q4H PRN Okonkwo, Justina A, NP   650 mg at 04/23/17 0900  . alum & mag hydroxide-simeth (MAALOX/MYLANTA) 200-200-20 MG/5ML suspension 30 mL  30 mL Oral Q4H PRN Okonkwo, Justina A, NP   30 mL at 04/24/17 2012  . docusate sodium (COLACE) capsule 100 mg  100 mg Oral Daily Fransisca Kaufmann A, NP   100 mg at 04/25/17 0804  . DULoxetine (CYMBALTA) DR capsule 40 mg  40 mg Oral Daily Cobos, Rockey Situ, MD   40 mg at 04/25/17 0805  . HYDROcodone-acetaminophen (NORCO/VICODIN) 5-325 MG per tablet 1.5 tablet  1.5 tablet Oral Q12H PRN Fransisca Kaufmann A, NP      . hydrOXYzine (ATARAX/VISTARIL) tablet 25 mg  25 mg Oral TID PRN Beryle Lathe, Justina A, NP   25 mg at 04/23/17 0118  . lisinopril (PRINIVIL,ZESTRIL) tablet 10 mg  10 mg Oral Daily Okonkwo, Justina A, NP   10 mg at 04/25/17 0804  . magnesium hydroxide (MILK OF MAGNESIA) suspension 30 mL  30 mL Oral Daily PRN Okonkwo, Justina A, NP   30 mL at 04/23/17 1633  . ondansetron (ZOFRAN) tablet 4 mg  4 mg Oral Q8H PRN Okonkwo, Justina A, NP      . pantoprazole (PROTONIX) EC tablet 40 mg  40 mg Oral Daily Okonkwo, Justina A, NP   40 mg at 04/25/17 0804  . predniSONE (DELTASONE) tablet 15 mg  15 mg Oral Daily Okonkwo, Justina A, NP   15 mg at 04/25/17 0804  . valACYclovir (VALTREX) tablet 500 mg  500 mg Oral Daily Okonkwo, Justina A, NP   500 mg at 04/25/17 0804  . zolpidem (AMBIEN) tablet 5 mg  5 mg Oral QHS PRN Beryle Lathe, Justina A, NP   5 mg at 04/24/17 2128   Current Outpatient Prescriptions  Medication Sig Dispense Refill  . [START  ON 04/26/2017] DULoxetine 40 MG CPEP Take 40 mg by mouth daily. For mood control 30 capsule 0  . HYDROcodone-acetaminophen (NORCO) 7.5-325 MG tablet Take 1 tablet by mouth 2 (two) times daily as needed for moderate pain.    . hydrOXYzine (ATARAX/VISTARIL) 25 MG tablet Take 1 tablet (25 mg total) by mouth 3 (three) times daily as needed for anxiety. 30 tablet 0  . lisinopril (PRINIVIL,ZESTRIL) 10 MG tablet Take 10 mg by mouth daily.     . Multiple Vitamins-Minerals (ONE-A-DAY VITACRAVES ADULT) CHEW Chew 1 each by mouth daily.    Marland Kitchen omeprazole (PRILOSEC) 40 MG capsule Take 40 mg by mouth daily.    . predniSONE (DELTASONE) 5 MG tablet Take 15 mg by mouth daily.    . rosuvastatin (CRESTOR) 5 MG tablet Take 5 mg by mouth daily.    . valACYclovir (VALTREX) 500 MG tablet Take 500 mg by mouth daily.      PTA Medications: No prescriptions prior to admission.    Treatment Modalities: Medication Management, Group therapy, Case management,  1 to 1 session with clinician, Psychoeducation, Recreational therapy.  Patient Stressors: Financial difficulties Health problems Loss of relationship Marital or family conflict Occupational concerns Substance abuse Patient Strengths: Ability for insight Average or above average intelligence  Communication skills Supportive family/friends  Physician Treatment Plan for Primary Diagnosis: MDD (major depressive disorder) Long Term Goal(s): Improvement in symptoms so as ready for discharge Short Term Goals: Ability to identify changes in lifestyle to reduce recurrence of condition will improve Ability to verbalize feelings will improve Ability to disclose and discuss suicidal ideas Ability to demonstrate self-control will improve Ability to identify and develop effective coping behaviors will improve Ability to maintain clinical measurements within normal limits will improve Compliance with prescribed medications will improve  Medication Management: Evaluate  patient's response, side effects, and tolerance of medication regimen.  Therapeutic Interventions: 1 to 1 sessions, Unit Group sessions and Medication administration.  Evaluation of Outcomes: Adequate for Discharge  Physician Treatment Plan for Secondary Diagnosis: Principal Problem:   MDD (major depressive disorder)  Long Term Goal(s): Improvement in symptoms so as ready for discharge  Short Term Goals: Ability to identify changes in lifestyle to reduce recurrence of condition will improve Ability to verbalize feelings will improve Ability to disclose and discuss suicidal ideas Ability to demonstrate self-control will improve Ability to identify and develop effective coping behaviors will improve Ability to maintain clinical measurements within normal limits will improve Compliance with prescribed medications will improve  Medication Management: Evaluate patient's response, side effects, and tolerance of medication regimen.  Therapeutic Interventions: 1 to 1 sessions, Unit Group sessions and Medication administration.  Evaluation of Outcomes: Adequate for Discharge  RN Treatment Plan for Primary Diagnosis: MDD (major depressive disorder) Long Term Goal(s): Knowledge of disease and therapeutic regimen to maintain health will improve  Short Term Goals: Ability to demonstrate self-control and Compliance with prescribed medications will improve  Medication Management: RN will administer medications as ordered by provider, will assess and evaluate patient's response and provide education to patient for prescribed medication. RN will report any adverse and/or side effects to prescribing provider.  Therapeutic Interventions: 1 on 1 counseling sessions, Psychoeducation, Medication administration, Evaluate responses to treatment, Monitor vital signs and CBGs as ordered, Perform/monitor CIWA, COWS, AIMS and Fall Risk screenings as ordered, Perform wound care treatments as  ordered.  Evaluation of Outcomes: Adequate for Discharge  LCSW Treatment Plan for Primary Diagnosis: MDD (major depressive disorder) Long Term Goal(s): Safe transition to appropriate next level of care at discharge, Engage patient in therapeutic group addressing interpersonal concerns. Short Term Goals: Engage patient in aftercare planning with referrals and resources, Increase emotional regulation, Identify triggers associated with mental health/substance abuse issues and Increase skills for wellness and recovery  Therapeutic Interventions: Assess for all discharge needs, 1 to 1 time with Social worker, Explore available resources and support systems, Assess for adequacy in community support network, Educate family and significant other(s) on suicide prevention, Complete Psychosocial Assessment, Interpersonal group therapy.  Evaluation of Outcomes: Adequate for Discharge  Progress in Treatment: Attending groups: Yes Participating in groups: Yes Taking medication as prescribed: Yes, MD continues to assess for medication changes as needed Toleration medication: Yes, no side effects reported at this time Family/Significant other contact made: Yes Patient understands diagnosis: Yes, AEB pt's willingness to participate in treatment. Discussing patient identified problems/goals with staff: Yes Medical problems stabilized or resolved: Yes Denies suicidal/homicidal ideation: Yes Issues/concerns per patient self-inventory: None Other: N/A  New problem(s) identified: None identified at this time.   New Short Term/Long Term Goal(s): "I want to care for myself and not be so worried all the time".   Discharge Plan or Barriers: Pt will return home and follow up outpatient with [facility].  Reason for Continuation of Hospitalization:  None identified at this time.  Estimated Length of Stay: 0 days; Pt discharging today 04/25/17  Attendees: Patient: [name]  04/25/2017 2:46 PM   Physician: Dr. Jama Flavorsobos 04/25/2017 2:46 PM  Nursing:  04/25/2017 2:46 PM  RN Care Manager: Onnie BoerJennifer Clark, RN 04/25/2017 2:46 PM  Social Worker: Donnelly StagerLynn Dang Mathison, LCSWA 04/25/2017 2:46 PM  Recreational Therapist:  04/25/2017 2:46 PM  Other: Armandina StammerAgnes Nwoko, NP 04/25/2017 2:46 PM  Other:  04/25/2017 2:46 PM  Other: 04/25/2017 2:46 PM  Scribe for Treatment Team: Jonathon JordanLynn B Kammi Hechler, MSW,LCSWA 04/25/2017 2:46 PM

## 2017-04-26 NOTE — Social Work (Signed)
Patient informed that MD has completed [employer] medical verification form - patient requests this be mailed to her at home address.    Santa GeneraAnne Cunningham, LCSW Lead Clinical Social Worker Phone:  [phone]

## 2017-07-02 ENCOUNTER — Ambulatory Visit (HOSPITAL_COMMUNITY): Payer: Self-pay | Admitting: Psychiatry

## 2017-07-14 DIAGNOSIS — H04123 Dry eye syndrome of bilateral lacrimal glands: Secondary | ICD-10-CM | POA: Diagnosis not present

## 2017-07-14 DIAGNOSIS — H40033 Anatomical narrow angle, bilateral: Secondary | ICD-10-CM | POA: Diagnosis not present

## 2017-07-20 ENCOUNTER — Ambulatory Visit (INDEPENDENT_AMBULATORY_CARE_PROVIDER_SITE_OTHER): Payer: Medicare Other | Admitting: Family Medicine

## 2017-07-20 ENCOUNTER — Encounter: Payer: Self-pay | Admitting: Family Medicine

## 2017-07-20 ENCOUNTER — Other Ambulatory Visit: Payer: Self-pay

## 2017-07-20 VITALS — BP 120/90 | HR 85 | Temp 98.2°F | Ht 70.08 in | Wt 174.0 lb

## 2017-07-20 DIAGNOSIS — R531 Weakness: Secondary | ICD-10-CM | POA: Diagnosis not present

## 2017-07-20 DIAGNOSIS — Z7952 Long term (current) use of systemic steroids: Secondary | ICD-10-CM

## 2017-07-20 DIAGNOSIS — I10 Essential (primary) hypertension: Secondary | ICD-10-CM

## 2017-07-20 DIAGNOSIS — E559 Vitamin D deficiency, unspecified: Secondary | ICD-10-CM | POA: Diagnosis not present

## 2017-07-20 DIAGNOSIS — Z23 Encounter for immunization: Secondary | ICD-10-CM

## 2017-07-20 DIAGNOSIS — Z8619 Personal history of other infectious and parasitic diseases: Secondary | ICD-10-CM

## 2017-07-20 DIAGNOSIS — Z0000 Encounter for general adult medical examination without abnormal findings: Secondary | ICD-10-CM | POA: Diagnosis not present

## 2017-07-20 DIAGNOSIS — M3320 Polymyositis, organ involvement unspecified: Secondary | ICD-10-CM | POA: Diagnosis not present

## 2017-07-20 DIAGNOSIS — G935 Compression of brain: Secondary | ICD-10-CM | POA: Diagnosis not present

## 2017-07-20 DIAGNOSIS — M8580 Other specified disorders of bone density and structure, unspecified site: Secondary | ICD-10-CM

## 2017-07-20 DIAGNOSIS — T3695XA Adverse effect of unspecified systemic antibiotic, initial encounter: Secondary | ICD-10-CM | POA: Diagnosis not present

## 2017-07-20 DIAGNOSIS — F331 Major depressive disorder, recurrent, moderate: Secondary | ICD-10-CM | POA: Diagnosis not present

## 2017-07-20 DIAGNOSIS — B379 Candidiasis, unspecified: Secondary | ICD-10-CM | POA: Diagnosis not present

## 2017-07-20 MED ORDER — VALACYCLOVIR HCL 500 MG PO TABS: 500.0000 mg | ORAL_TABLET | Freq: Two times a day (BID) | ORAL | 2 refills | Status: DC

## 2017-07-20 MED ORDER — DULOXETINE HCL 20 MG PO CPEP: 20.0000 mg | ORAL_CAPSULE | Freq: Two times a day (BID) | ORAL | 3 refills | Status: DC

## 2017-07-20 MED ORDER — FLUCONAZOLE 150 MG PO TABS: 150.0000 mg | ORAL_TABLET | Freq: Once | ORAL | 0 refills | Status: AC

## 2017-07-20 NOTE — Patient Instructions (Addendum)
  1. Do not resume oral prednisone: Please call or see me if having symptoms of concern: dehydration, dizziness, fainting, fatigue, lightheadedness, loss of appetite, low blood pressure, low blood sugar, nausea, vomiting, or sweating   2. Check BP every day at same time. Normal BP < 120/80. Reason to restart amlodipine (norvasc) is recurring BP 140/90 or greater.  3. Stop taking valtrex daily. New prescription sent to take if you have an outbreak.   IF you received an x-ray today, you will receive an invoice from [facility]. Please contact [facility] at [phone] with questions or concerns regarding your invoice.   IF you received labwork today, you will receive an invoice from [facility]. Please contact [facility] at [phone] with questions or concerns regarding your invoice.   Our billing staff will not be able to assist you with questions regarding bills from these companies.  You will be contacted with the lab results as soon as they are available. The fastest way to get your results is to activate your My Chart account. Instructions are located on the last page of this paperwork. If you have not heard from us regarding the results in 2 weeks, please contact this office.

## 2017-07-20 NOTE — Progress Notes (Signed)
11/23/20183:45 PM  [name] 02/26/1976, 41 y.o. female [id number]  Chief Complaint  Patient presents with  . Establish Care    pt does want tdap but would like to talk with provider about the flu vaccine.  Pt had her  pap in March.    HPI:   Patient is a 41 y.o. female with past medical history significant for polymyositis with left sided sequela and long term use of systemic steroids, chiari malformation s/p surgery with remaining syrinx, HTN and depression with anxiety who presents today to establish care.  1. Polymyositis and chiari malformation, managed by Neurologist Dr Janan RidgeJames Caress. Surgery for chiari malformation done in 2009. Soon afterwards patient had severe weakness of left side of body. Polymyositis diagnosed in 2009 per muscle biopsy and EMG. Treated with IVIG, oral prednisone and methotrexate.  Rheum consult done in 2015.   Patient had a very long recovery course, used to use a powered wheelchair for ambulation, now uses a cane and at home for short distances can do without an assistive device.   Started on retiximub infusions 06/2016  In September 2018 had a boil on her buttock with recurrence in October. At that time provider mentioned that the prednisone might have had something to do with recurrence/slow resolution. Did resolve completely after changing antibiotic but she also completely stopped her oral prednisone. Previously doing 15mg  a day. Has not taken any for the past 3 weeks and feels fine. Has very minimal pain. Denies any symptoms of adrenal insufficiency.  She would like to do PT again as she feels she is getting stronger and would like to work on her balance and gait.  2. Osteopenia, per patient in dexa 2015. Low vitamin D in past. Takes calcium and vitamin D daily.  3. Has vaginal itchiness/irritation and white thick discharge that started with second course of antibiotic  4. HSV, told she had at age 89, never had an outbreak until a month ago,  right groin. However she has been taking valtrex daily since March 2018.   5. HTN, was prescribed medication during one of her hospitalizations. She felt it was related to stress.  Has not been taking lisinopril for about a week. Does not check BP at home but does have a cuff.   6. Patient hospitalized for 2 days about a month ago in Psych unit for depression with SI. Was discharged on duloxetine 40mg  once a day which she stopped as she feels it makes her mind foggy, blunted emotionally. Currently safe at home. Denies any SI/HI. Feels that maybe 40mg  was to quick of an increase as she had only been on 20mg  for 2 days. She has never taken meds for depression/anxiety before. Smokes THC and denies any other drug use.    No Known Allergies  Prior to Admission medications   Medication Sig Start Date End Date Taking? Authorizing Provider  HYDROcodone-acetaminophen (NORCO) 7.5-325 MG tablet Take 1 tablet by mouth 2 (two) times daily as needed for moderate pain.   Yes [provider]  hydrOXYzine (ATARAX/VISTARIL) 25 MG tablet Take 1 tablet (25 mg total) by mouth 3 (three) times daily as needed for anxiety. 04/25/17  Yes Money, Gerlene Burdockravis B, FNP  lisinopril (PRINIVIL,ZESTRIL) 10 MG tablet Take 10 mg by mouth daily.  Not taking  Yes [provider]  Multiple Vitamins-Minerals (ONE-A-DAY VITACRAVES ADULT) CHEW Chew 1 each by mouth daily.   Yes [provider]  omeprazole (PRILOSEC) 40 MG capsule Take 40 mg by  mouth daily.   Yes [provider]  prednisoLONE (PRELONE) 15 MG/5ML SOLN Take by mouth daily before breakfast. Not taking  Yes [provider]  predniSONE (DELTASONE) 5 MG tablet Take 15 mg by mouth daily. Not taking  Yes [provider]  rosuvastatin (CRESTOR) 5 MG tablet Take 5 mg by mouth daily.   Yes [provider]  valACYclovir (VALTREX) 500 MG tablet Take 1 tablet (500 mg total) by mouth daily   Yes Myles Lipps, MD  DULoxetine  (CYMBALTA) 40 MG capsule Take 1 capsule (40 mg total) by mouth daily. Not taking   Myles Lipps, MD    Past Medical History:  Diagnosis Date  . Anxiety   . Hypertension   . Polymastia   . Polymyositis (HCC) 2009    Past Surgical History:  Procedure Laterality Date  . BRAIN SURGERY      Social History   Tobacco Use  . Smoking status: Never Smoker  . Smokeless tobacco: Never Used  Substance Use Topics  . Alcohol use: No    Family History  Problem Relation Age of Onset  . Hepatitis Mother   . Diabetes Paternal Uncle   . Diabetes Maternal Grandmother   . Heart disease Paternal Grandfather     Review of Systems  Constitutional: Negative for chills, fever, malaise/fatigue and weight loss.  Respiratory: Negative for cough and shortness of breath.   Cardiovascular: Negative for chest pain, palpitations and leg swelling.  Gastrointestinal: Negative for abdominal pain, constipation, diarrhea, nausea and vomiting.  Genitourinary: Negative for dysuria and hematuria.  Musculoskeletal: Positive for myalgias.  Neurological: Positive for tingling and focal weakness. Negative for dizziness.  Psychiatric/Behavioral: Positive for depression. Negative for substance abuse and suicidal ideas. The patient is nervous/anxious.      OBJECTIVE:  Blood pressure 120/90, pulse 85, temperature 98.2 F (36.8 C), height 5' 10.08" (1.78 m), weight 174 lb (78.9 kg), last menstrual period 06/20/2017, SpO2 100 %.  Physical Exam  Constitutional: She is oriented to person, place, and time and well-developed, well-nourished, and in no distress.  HENT:  Head: Normocephalic and atraumatic.  Right Ear: Hearing, tympanic membrane, external ear and ear canal normal.  Left Ear: Hearing, tympanic membrane, external ear and ear canal normal.  Mouth/Throat: Oropharynx is clear and moist.  Eyes: EOM are normal. Pupils are equal, round, and reactive to light.  Neck: Neck supple. No thyromegaly present.    Cardiovascular: Normal rate, regular rhythm, normal heart sounds and intact distal pulses. Exam reveals no gallop and no friction rub.  No murmur heard. Pulmonary/Chest: Effort normal and breath sounds normal. She has no wheezes. She has no rales.  Abdominal: Soft. Bowel sounds are normal. She exhibits no distension and no mass. There is no tenderness.  Musculoskeletal: Normal range of motion. She exhibits no edema.  Lymphadenopathy:    She has no cervical adenopathy.  Neurological: She is alert and oriented to person, place, and time. She displays weakness and abnormal reflex. Gait abnormal.  Reflex Scores:      Patellar reflexes are 2+ on the right side and 0 on the left side.      Achilles reflexes are 2+ on the right side and 2+ on the left side. Skin: Skin is warm and dry.  Psychiatric: Mood and affect normal.  Nursing note and vitals reviewed.   ASSESSMENT and PLAN  1. Encounter for medical examination to establish care PMH, PSH, meds, allergies, Fhx, Shx, reviewed with patient today. Extensive chart  review done today. - CBC with Differential - Comprehensive metabolic panel - Lipid panel - TSH  2. HYPERTENSION, BENIGN ESSENTIAL Normal BP in clinic off of meds today. Discussed outpatient monitoring. Bring BP reading log to next visit. Goal < 140/90. Continue with LFM.  - CBC with Differential - Comprehensive metabolic panel - Lipid panel - TSH  3. Left-sided weakness - Ambulatory referral to Physical Therapy  4. POLYMYOSITIS Managed by neurology, follow-up as scheduled. - Ambulatory referral to Physical Therapy  5. Moderate episode of recurrent major depressive disorder (HCC) Patient would like to do trial of duloxetine 20mg  once a day with slow titration if needed. Denies any SI today. - DULoxetine (CYMBALTA) 20 MG capsule; Take 1 capsule (20 mg total) by mouth 2 (two) times daily.  6. CHIARI MALFORMATION Managed by neurology, follow-up as scheduled. - Ambulatory  referral to Physical Therapy  7. Osteopenia, unspecified location Will repeat dexa and vitamin D as abnormal in the past. Continue with weight bearing exercise and calcium/vitamin D supplementation. - Vitamin D, 25-hydroxy - DG Bone Density; Future  8. Long term systemic steroid user Patient stopped her oral prednisone abruptly over 3 weeks ago without any sequela. Neurology had tried to wean her off several times in the past without success due to resurgence of polymyositis symptoms. She seems to be doing well off the prednisone. I advised not to resume at this point. Strict RTC precautions given.  - Vitamin D, 25-hydroxy - DG Bone Density; Future  9. Antibiotic-induced yeast infection - fluconazole (DIFLUCAN) 150 MG tablet; Take 1 tablet (150 mg total) by mouth once for 1 dose.  10. Need for Tdap vaccination - Tdap vaccine greater than or equal to 7yo IM  11. H/O herpes simplex infection Patient was placed on suppressive therapy even before she had an outbreak. She has since had one outbreak. Discussed dosing for flare-ups. She is immunosuppressed so will monitor closely. If she has recurring outbreaks then will restart suppressive dosing again.  - valACYclovir (VALTREX) 500 MG tablet; Take 1 tablet (500 mg total) by mouth 2 (two) times daily. for 3 days at onset of symptoms related with an outbreak   Return in about 2 weeks (around 08/03/2017).    Myles LippsIrma M Santiago, MD [facility] [address] [place], [place] [address] Ph.  [phone] Fax [phone]

## 2017-07-21 LAB — COMPREHENSIVE METABOLIC PANEL
ALT: 14 IU/L (ref 0–32)
AST: 20 IU/L (ref 0–40)
Albumin/Globulin Ratio: 1.4 (ref 1.2–2.2)
Albumin: 4.6 g/dL (ref 3.5–5.5)
Alkaline Phosphatase: 84 IU/L (ref 39–117)
BUN/Creatinine Ratio: 22 (ref 9–23)
BUN: 11 mg/dL (ref 6–20)
Bilirubin Total: 0.2 mg/dL (ref 0.0–1.2)
CO2: 19 mmol/L — ABNORMAL LOW (ref 20–29)
Calcium: 9.5 mg/dL (ref 8.7–10.2)
Chloride: 106 mmol/L (ref 96–106)
Creatinine, Ser: 0.5 mg/dL — ABNORMAL LOW (ref 0.57–1.00)
GFR calc Af Amer: 141 mL/min/{1.73_m2} (ref >59)
GFR calc non Af Amer: 122 mL/min/{1.73_m2} (ref >59)
Globulin, Total: 3.3 g/dL (ref 1.5–4.5)
Glucose: 82 mg/dL (ref 65–99)
Potassium: 4.2 mmol/L (ref 3.5–5.2)
Sodium: 141 mmol/L (ref 134–144)
Total Protein: 7.9 g/dL (ref 6.0–8.5)

## 2017-07-21 LAB — CBC WITH DIFFERENTIAL/PLATELET
Basophils Absolute: 0 10*3/uL (ref 0.0–0.2)
Basos: 0 %
EOS (ABSOLUTE): 0.1 10*3/uL (ref 0.0–0.4)
Eos: 2 %
Hematocrit: 45.2 % (ref 34.0–46.6)
Hemoglobin: 15.1 g/dL (ref 11.1–15.9)
Immature Grans (Abs): 0 10*3/uL (ref 0.0–0.1)
Immature Granulocytes: 0 %
Lymphocytes Absolute: 2.3 10*3/uL (ref 0.7–3.1)
Lymphs: 35 %
MCH: 31.9 pg (ref 26.6–33.0)
MCHC: 33.4 g/dL (ref 31.5–35.7)
MCV: 95 fL (ref 79–97)
Monocytes Absolute: 0.4 10*3/uL (ref 0.1–0.9)
Monocytes: 6 %
Neutrophils Absolute: 3.9 10*3/uL (ref 1.4–7.0)
Neutrophils: 57 %
Platelets: 363 10*3/uL (ref 150–379)
RBC: 4.74 x10E6/uL (ref 3.77–5.28)
RDW: 14.9 % (ref 12.3–15.4)
WBC: 6.7 10*3/uL (ref 3.4–10.8)

## 2017-07-21 LAB — LIPID PANEL
Chol/HDL Ratio: 4.9 ratio — ABNORMAL HIGH (ref 0.0–4.4)
Cholesterol, Total: 237 mg/dL — ABNORMAL HIGH (ref 100–199)
HDL: 48 mg/dL (ref >39)
LDL Calculated: 173 mg/dL — ABNORMAL HIGH (ref 0–99)
Triglycerides: 80 mg/dL (ref 0–149)
VLDL Cholesterol Cal: 16 mg/dL (ref 5–40)

## 2017-07-21 LAB — TSH: TSH: 1.07 u[IU]/mL (ref 0.450–4.500)

## 2017-07-21 LAB — VITAMIN D 25 HYDROXY (VIT D DEFICIENCY, FRACTURES): Vit D, 25-Hydroxy: 20.9 ng/mL — ABNORMAL LOW (ref 30.0–100.0)

## 2017-08-03 ENCOUNTER — Ambulatory Visit (INDEPENDENT_AMBULATORY_CARE_PROVIDER_SITE_OTHER): Payer: Medicare Other | Admitting: Family Medicine

## 2017-08-03 ENCOUNTER — Telehealth: Payer: Self-pay | Admitting: Family Medicine

## 2017-08-03 ENCOUNTER — Other Ambulatory Visit: Payer: Self-pay

## 2017-08-03 ENCOUNTER — Encounter: Payer: Self-pay | Admitting: Family Medicine

## 2017-08-03 VITALS — BP 146/98 | HR 71 | Temp 98.1°F | Ht 69.0 in | Wt 171.0 lb

## 2017-08-03 DIAGNOSIS — E7800 Pure hypercholesterolemia, unspecified: Secondary | ICD-10-CM

## 2017-08-03 DIAGNOSIS — E559 Vitamin D deficiency, unspecified: Secondary | ICD-10-CM

## 2017-08-03 DIAGNOSIS — F4321 Adjustment disorder with depressed mood: Secondary | ICD-10-CM

## 2017-08-03 DIAGNOSIS — I10 Essential (primary) hypertension: Secondary | ICD-10-CM | POA: Diagnosis not present

## 2017-08-03 MED ORDER — EZETIMIBE 10 MG PO TABS: 10.0000 mg | ORAL_TABLET | Freq: Every day | ORAL | 1 refills | Status: DC

## 2017-08-03 MED ORDER — LISINOPRIL 10 MG PO TABS: 10.0000 mg | ORAL_TABLET | Freq: Every day | ORAL | 1 refills | Status: DC

## 2017-08-03 MED ORDER — VITAMIN D (ERGOCALCIFEROL) 1.25 MG (50000 UNIT) PO CAPS: 50000.0000 [IU] | ORAL_CAPSULE | ORAL | 2 refills | Status: DC

## 2017-08-03 NOTE — Progress Notes (Signed)
12/7/20181:38 PM  [name] 07/20/1976, 41 y.o. female [id number]  Chief Complaint  Patient presents with  . Hypertension    FOLLOW UP    HPI:   Patient is a 41 y.o. female with past medical history significant for polymyositis with left sided sequela and long term use of systemic steroids, chiari malformation s/p surgery with remaining syrinx, HTN and depression with anxiety who presents today for routine follow-up.  She reports that she is doing much better lower dose of citalopram (20mg ). She is not as blunted emotionally. She denies any SI. She reports good support system. She is excited as she will be getting married next year. She struggles with anger and frustration in her current financial/productivity circumstances. She is on full disability, is getting her GED thru vocational rehab. Has been advised to persue training in computers or something were she works sitting down. She prefers to work with her hands and be active. She has not done counseling since her diagnosis in 2009.  She also has not taken lisinopril since our last visit. She checks her BP at home, usually twice, she reports normal readings, she does acknowledge that her BP can get very high specially if she gets angry. She has never checked her home BP cuff to see if accurate.  She reports she takes crestor as prescribed, has myalgias at baseline, has decided to not eat red meat. She is unaware of significant cholesterol issues in her family.   Depression screen PHQ 2/9 08/03/2017  Decreased Interest 0  Down, Depressed, Hopeless 0  PHQ - 2 Score 0    No Known Allergies  Prior to Admission medications   Medication Sig Start Date End Date Taking? Authorizing Provider  DULoxetine (CYMBALTA) 20 MG capsule Take 1 capsule (20 mg total) by mouth 2 (two) times daily. 07/20/17  Yes Myles LippsSantiago, Finnlee Silvernail M, MD  HYDROcodone-acetaminophen (NORCO) 7.5-325 MG tablet Take 1 tablet by mouth 2 (two) times daily as needed for  moderate pain.   Yes [provider]  hydrOXYzine (ATARAX/VISTARIL) 25 MG tablet Take 1 tablet (25 mg total) by mouth 3 (three) times daily as needed for anxiety. 04/25/17  Yes Money, Gerlene Burdockravis B, FNP  lisinopril (PRINIVIL,ZESTRIL) 10 MG tablet Take 10 mg by mouth daily.    Yes [provider]  Multiple Vitamins-Minerals (ONE-A-DAY VITACRAVES ADULT) CHEW Chew 1 each by mouth daily.   Yes [provider]  omeprazole (PRILOSEC) 40 MG capsule Take 40 mg by mouth daily.   Yes [provider]  predniSONE (DELTASONE) 5 MG tablet Take 15 mg by mouth daily.   Yes [provider]  rosuvastatin (CRESTOR) 5 MG tablet Take 5 mg by mouth daily.   Yes [provider]  valACYclovir (VALTREX) 500 MG tablet Take 1 tablet (500 mg total) by mouth 2 (two) times daily. for 3 days at onset of symptoms related with an outbreak 07/20/17  Yes Myles LippsSantiago, Synai Prettyman M, MD    Past Medical History:  Diagnosis Date  . Anxiety   . Hypertension   . Polymastia   . Polymyositis (HCC) 2009    Past Surgical History:  Procedure Laterality Date  . BRAIN SURGERY      Social History   Tobacco Use  . Smoking status: Never Smoker  . Smokeless tobacco: Never Used  Substance Use Topics  . Alcohol use: No    Family History  Problem Relation Age of Onset  . Hepatitis Mother   . Diabetes Paternal Uncle   .  Diabetes Maternal Grandmother   . Heart disease Paternal Grandfather     Review of Systems  Constitutional: Negative for chills and fever.  Respiratory: Negative for cough and shortness of breath.   Cardiovascular: Negative for chest pain, palpitations and leg swelling.  Gastrointestinal: Negative for abdominal pain, nausea and vomiting.     OBJECTIVE:  Blood pressure (!) 132/94, pulse 71, temperature 98.1 F (36.7 C), temperature source Oral, height 5\' 9"  (1.753 m), weight 171 lb (77.6 kg), SpO2 100 %.  BP Readings from Last 3 Encounters:  08/03/17 (!) 146/98    07/20/17 120/90  04/22/17 120/72    Physical Exam  Constitutional: She is oriented to person, place, and time and well-developed, well-nourished, and in no distress.  HENT:  Head: Normocephalic and atraumatic.  Mouth/Throat: Oropharynx is clear and moist. No oropharyngeal exudate.  Eyes: EOM are normal. Pupils are equal, round, and reactive to light. No scleral icterus.  Neck: Neck supple.  Cardiovascular: Normal rate, regular rhythm and normal heart sounds. Exam reveals no gallop and no friction rub.  No murmur heard. Pulmonary/Chest: Effort normal and breath sounds normal. She has no wheezes. She has no rales.  Musculoskeletal: She exhibits no edema.  Neurological: She is alert and oriented to person, place, and time.  Skin: Skin is warm and dry.  Psychiatric: Mood and affect normal.    ASSESSMENT and PLAN  1. HYPERCHOLESTEROLEMIA Adding zetia instead of increasing crestor given already baseline myalgias. Recheck labs in 3 months. - ezetimibe (ZETIA) 10 MG tablet; Take 1 tablet (10 mg total) by mouth daily.  2. Vitamin D deficiency Recheck in 3 months, once at goal transition to daily OTC - Vitamin D, Ergocalciferol, (DRISDOL) 50000 units CAPS capsule; Take 1 capsule (50,000 Units total) by mouth every 7 (seven) days.  3. HYPERTENSION, BENIGN ESSENTIAL Discussed restarting lisinopril - lisinopril (PRINIVIL,ZESTRIL) 10 MG tablet; Take 1 tablet (10 mg total) by mouth daily.  4. Adjustment disorder with depressed mood - Ambulatory referral to Psychology    Return in about 3 months (around 11/01/2017).    Myles LippsIrma M Santiago, MD [facility] at [facility] [address] [place], [place] [address] Ph.  [phone] Fax [phone]

## 2017-08-03 NOTE — Telephone Encounter (Signed)
Copied from CRM [id number]. Topic: Referral - Status >> Aug 03, 2017  3:23 PM [name], Rosey Batheresa D wrote: Reason for CRM: Patient called and wanted to know the status of her referral to have a Bone Density and appt with Psychiatry. Please call patient back, thanks.

## 2017-08-03 NOTE — Patient Instructions (Signed)
     IF you received an x-ray today, you will receive an invoice from [facility]. Please contact [facility] at [phone] with questions or concerns regarding your invoice.   IF you received labwork today, you will receive an invoice from [facility]. Please contact [facility] at [phone] with questions or concerns regarding your invoice.   Our billing staff will not be able to assist you with questions regarding bills from these companies.  You will be contacted with the lab results as soon as they are available. The fastest way to get your results is to activate your My Chart account. Instructions are located on the last page of this paperwork. If you have not heard from us regarding the results in 2 weeks, please contact this office.     

## 2017-08-04 ENCOUNTER — Encounter: Payer: Self-pay | Admitting: Family Medicine

## 2017-08-09 NOTE — Telephone Encounter (Signed)
Please advise 

## 2017-08-10 NOTE — Telephone Encounter (Signed)
Spoke with pt. Advised her that [facility] and they can be reached at [phone] and Psychology referral has [facility] and their number is [phone]. The pt did not want the phone numbers right now but said if she does not hear anything mid net week she will call back to get those from us. Thanks!

## 2017-08-15 DIAGNOSIS — R2689 Other abnormalities of gait and mobility: Secondary | ICD-10-CM | POA: Diagnosis not present

## 2017-08-15 DIAGNOSIS — M6281 Muscle weakness (generalized): Secondary | ICD-10-CM | POA: Diagnosis not present

## 2017-08-15 DIAGNOSIS — R262 Difficulty in walking, not elsewhere classified: Secondary | ICD-10-CM | POA: Diagnosis not present

## 2017-08-15 DIAGNOSIS — R279 Unspecified lack of coordination: Secondary | ICD-10-CM | POA: Diagnosis not present

## 2017-09-03 DIAGNOSIS — M6281 Muscle weakness (generalized): Secondary | ICD-10-CM | POA: Diagnosis not present

## 2017-09-03 DIAGNOSIS — R279 Unspecified lack of coordination: Secondary | ICD-10-CM | POA: Diagnosis not present

## 2017-09-03 DIAGNOSIS — R262 Difficulty in walking, not elsewhere classified: Secondary | ICD-10-CM | POA: Diagnosis not present

## 2017-09-03 DIAGNOSIS — R2689 Other abnormalities of gait and mobility: Secondary | ICD-10-CM | POA: Diagnosis not present

## 2017-09-05 DIAGNOSIS — M6281 Muscle weakness (generalized): Secondary | ICD-10-CM | POA: Diagnosis not present

## 2017-09-05 DIAGNOSIS — R2689 Other abnormalities of gait and mobility: Secondary | ICD-10-CM | POA: Diagnosis not present

## 2017-09-05 DIAGNOSIS — R262 Difficulty in walking, not elsewhere classified: Secondary | ICD-10-CM | POA: Diagnosis not present

## 2017-09-05 DIAGNOSIS — R279 Unspecified lack of coordination: Secondary | ICD-10-CM | POA: Diagnosis not present

## 2017-09-12 ENCOUNTER — Ambulatory Visit
Admission: RE | Admit: 2017-09-12 | Discharge: 2017-09-12 | Disposition: A | Discharge status: Home / Self Care | Payer: Medicare Other | Source: Ambulatory Visit | Attending: Family Medicine | Admitting: Family Medicine

## 2017-09-12 DIAGNOSIS — M85851 Other specified disorders of bone density and structure, right thigh: Secondary | ICD-10-CM | POA: Diagnosis not present

## 2017-09-12 DIAGNOSIS — M8580 Other specified disorders of bone density and structure, unspecified site: Secondary | ICD-10-CM

## 2017-09-12 DIAGNOSIS — Z7952 Long term (current) use of systemic steroids: Secondary | ICD-10-CM

## 2017-09-13 ENCOUNTER — Encounter: Payer: Self-pay | Admitting: Family Medicine

## 2017-09-13 DIAGNOSIS — R2689 Other abnormalities of gait and mobility: Secondary | ICD-10-CM | POA: Diagnosis not present

## 2017-09-13 DIAGNOSIS — R262 Difficulty in walking, not elsewhere classified: Secondary | ICD-10-CM | POA: Diagnosis not present

## 2017-09-13 DIAGNOSIS — R279 Unspecified lack of coordination: Secondary | ICD-10-CM | POA: Diagnosis not present

## 2017-09-13 DIAGNOSIS — M6281 Muscle weakness (generalized): Secondary | ICD-10-CM | POA: Diagnosis not present

## 2017-09-17 DIAGNOSIS — R262 Difficulty in walking, not elsewhere classified: Secondary | ICD-10-CM | POA: Diagnosis not present

## 2017-09-17 DIAGNOSIS — R2689 Other abnormalities of gait and mobility: Secondary | ICD-10-CM | POA: Diagnosis not present

## 2017-09-17 DIAGNOSIS — R279 Unspecified lack of coordination: Secondary | ICD-10-CM | POA: Diagnosis not present

## 2017-09-17 DIAGNOSIS — M6281 Muscle weakness (generalized): Secondary | ICD-10-CM | POA: Diagnosis not present

## 2017-09-20 DIAGNOSIS — R262 Difficulty in walking, not elsewhere classified: Secondary | ICD-10-CM | POA: Diagnosis not present

## 2017-09-20 DIAGNOSIS — R279 Unspecified lack of coordination: Secondary | ICD-10-CM | POA: Diagnosis not present

## 2017-09-20 DIAGNOSIS — M6281 Muscle weakness (generalized): Secondary | ICD-10-CM | POA: Diagnosis not present

## 2017-09-20 DIAGNOSIS — R2689 Other abnormalities of gait and mobility: Secondary | ICD-10-CM | POA: Diagnosis not present

## 2017-09-27 DIAGNOSIS — M545 Low back pain: Secondary | ICD-10-CM | POA: Diagnosis not present

## 2017-09-27 DIAGNOSIS — M25551 Pain in right hip: Secondary | ICD-10-CM | POA: Diagnosis not present

## 2017-09-27 DIAGNOSIS — M25552 Pain in left hip: Secondary | ICD-10-CM | POA: Diagnosis not present

## 2017-09-27 DIAGNOSIS — M3320 Polymyositis, organ involvement unspecified: Secondary | ICD-10-CM | POA: Diagnosis not present

## 2017-09-27 DIAGNOSIS — Z79899 Other long term (current) drug therapy: Secondary | ICD-10-CM | POA: Diagnosis not present

## 2017-09-27 DIAGNOSIS — G8929 Other chronic pain: Secondary | ICD-10-CM | POA: Diagnosis not present

## 2017-10-01 ENCOUNTER — Ambulatory Visit (INDEPENDENT_AMBULATORY_CARE_PROVIDER_SITE_OTHER): Payer: Medicare Other | Admitting: Family Medicine

## 2017-10-01 ENCOUNTER — Other Ambulatory Visit: Payer: Self-pay

## 2017-10-01 ENCOUNTER — Encounter: Payer: Self-pay | Admitting: Family Medicine

## 2017-10-01 VITALS — BP 118/82 | HR 89 | Temp 97.9°F | Resp 17 | Ht 69.0 in | Wt 172.2 lb

## 2017-10-01 DIAGNOSIS — H04123 Dry eye syndrome of bilateral lacrimal glands: Secondary | ICD-10-CM

## 2017-10-01 DIAGNOSIS — M3320 Polymyositis, organ involvement unspecified: Secondary | ICD-10-CM | POA: Diagnosis not present

## 2017-10-01 DIAGNOSIS — E7800 Pure hypercholesterolemia, unspecified: Secondary | ICD-10-CM | POA: Diagnosis not present

## 2017-10-01 MED ORDER — OLOPATADINE HCL 0.2 % OP SOLN: 1.0000 [drp] | Freq: Every day | OPHTHALMIC | 3 refills | Status: DC

## 2017-10-01 NOTE — Progress Notes (Signed)
Chief Complaint  Patient presents with  . Dry Eye    itchy puffy swollen eyes x last week.  Using restasis but no relief.  More in the right eye thant the left, using warm cloth to help with the swelling    HPI  Dry Eyes Pt reports that she has a history of dry eye She states that her eyes were checked by the eye doctor and she was given restasis 2 months ago She reports that last weeks she had puffy itchy swollen eyes This weekend she got some swelling in the upper lid worse than the lower and worse on the right than the left  She denies dry mouth or dry vagina Starting a year ago she had episodes that seemed to come and go She reports that using cool and warm compresses relieves the itching She started her cymbalta since September 2018  Polymyositis She is taking methotrexate But her Neurologist's note did not indicate a need for prednisone She ambulates with a cane and has chronic pain syndrome  Hyperlipidemia She was started on Crestor but her neurologist is concerned about her polymyositis might get worse  She plans to stop the Crestor and change her diet She states that so far she has not had any increase muscle pain due to her use of Crestor She is currently still taking her doses Lab Results  Component Value Date   CHOL 237 (H) 07/20/2017   CHOL 220 (H) 05/19/2010   CHOL 268 (H) 01/27/2009   Lab Results  Component Value Date   HDL 48 07/20/2017   HDL 43 05/19/2010   HDL 48 01/27/2009   Lab Results  Component Value Date   LDLCALC 173 (H) 07/20/2017   LDLCALC 153 (H) 05/19/2010   LDLCALC 185 (H) 01/27/2009   Lab Results  Component Value Date   TRIG 80 07/20/2017   TRIG 121 05/19/2010   TRIG 175 (H) 01/27/2009   Lab Results  Component Value Date   CHOLHDL 4.9 (H) 07/20/2017   CHOLHDL 5.1 Ratio 05/19/2010   CHOLHDL 5.6 Ratio 01/27/2009   No results found for: LDLDIRECT    Past Medical History:  Diagnosis Date  . Anxiety   . Hypertension   .  Polymastia   . Polymyositis (HCC) 2009    Current Outpatient Medications  Medication Sig Dispense Refill  . DULoxetine (CYMBALTA) 20 MG capsule Take 1 capsule (20 mg total) by mouth 2 (two) times daily. 60 capsule 3  . ezetimibe (ZETIA) 10 MG tablet Take 1 tablet (10 mg total) by mouth daily. 90 tablet 1  . HYDROcodone-acetaminophen (NORCO) 7.5-325 MG tablet Take 1 tablet by mouth 2 (two) times daily as needed for moderate pain.    . hydrOXYzine (ATARAX/VISTARIL) 25 MG tablet Take 1 tablet (25 mg total) by mouth 3 (three) times daily as needed for anxiety. 30 tablet 0  . lisinopril (PRINIVIL,ZESTRIL) 10 MG tablet Take 1 tablet (10 mg total) by mouth daily. 90 tablet 1  . Multiple Vitamins-Minerals (ONE-A-DAY VITACRAVES ADULT) CHEW Chew 1 each by mouth daily.    . Olopatadine HCl 0.2 % SOLN Apply 1 drop to eye daily. 2.5 mL 3  . omeprazole (PRILOSEC) 40 MG capsule Take 40 mg by mouth daily.    . rosuvastatin (CRESTOR) 5 MG tablet Take 5 mg by mouth daily.    . valACYclovir (VALTREX) 500 MG tablet Take 1 tablet (500 mg total) by mouth 2 (two) times daily. for 3 days at onset of symptoms related with  an outbreak 30 tablet 2   No current facility-administered medications for this visit.     Allergies: No Known Allergies  Past Surgical History:  Procedure Laterality Date  . BRAIN SURGERY      Social History   Socioeconomic History  . Marital status: Single    Spouse name: Not on file  . Number of children: Not on file  . Years of education: Not on file  . Highest education level: Not on file  Social Needs  . Financial resource strain: Not on file  . Food insecurity - worry: Not on file  . Food insecurity - inability: Not on file  . Transportation needs - medical: Not on file  . Transportation needs - non-medical: Not on file  Occupational History  . Not on file  Tobacco Use  . Smoking status: Never Smoker  . Smokeless tobacco: Never Used  Substance and Sexual Activity  .  Alcohol use: No  . Drug use: Yes    Types: Marijuana    Comment: Episodic  . Sexual activity: Yes  Other Topics Concern  . Not on file  Social History Narrative  . Not on file    Family History  Problem Relation Age of Onset  . Hepatitis Mother   . Diabetes Paternal Uncle   . Diabetes Maternal Grandmother   . Heart disease Paternal Grandfather      ROS Review of Systems See HPI Constitution: No fevers or chills No malaise No diaphoresis Skin: No rash or itching Eyes: no blurry vision, no double vision GU: no dysuria or hematuria Neuro: no dizziness or headaches all others reviewed and negative   Objective: Vitals:   10/01/17 1427  BP: 118/82  Pulse: 89  Resp: 17  Temp: 97.9 F (36.6 C)  TempSrc: Oral  SpO2: 100%  Weight: 172 lb 3.2 oz (78.1 kg)  Height: 5\' 9"  (1.753 m)    Physical Exam  General: alert, oriented, in NAD Head: normocephalic, atraumatic, no sinus tenderness Eyes: Edematous upper lid of the right eye Normal tear film Pupil reaction normal PERRL Ears: TM clear bilaterally Nose: mucosa nonerythematous, nonedematous Throat: no pharyngeal exudate or erythema Lymph: no posterior auricular, submental or cervical lymph adenopathy Heart: normal rate, normal sinus rhythm, no murmurs Lungs: clear to auscultation bilaterally, no wheezing    Assessment and Plan [name] was seen today for dry eye.  Diagnoses and all orders for this visit:  Chronic dryness of both eyes -     Sjogren's syndrome antibods(ssa + ssb) -     ANA w/Reflex if Positive  HYPERCHOLESTEROLEMIA  POLYMYOSITIS  Other orders -     Olopatadine HCl 0.2 % SOLN; Apply 1 drop to eye daily.     1. For dry eyes -  Start olapatadine eye drops once daily to treat your symptoms Add zyrtec or claritin If after a week you still have symptoms then stop the Cymbalta to see if it was a medication side effects Blood tests - labs were drawn to check for autoimmune conditions that  could be contributing  2. Crestor -  You are at 5mg  which is the lowest dose You can continue the medication until after you next labs If the levels improve and your eating is better you can discuss stopping it and just doing lifestyle modification  3. Polymyositis Continue Neurology recommendations  [name] Hubbs A Creta Levin

## 2017-10-01 NOTE — Patient Instructions (Addendum)
  1. For dry eyes -  Start olapatadine eye drops once daily to treat your symptoms Add zyrtec or claritin If after a week you still have symptoms then stop the Cymbalta to see if it was a medication side effects  2. Crestor -  You are at 5mg  which is the lowest dose You can continue the medication until after you next labs If the levels improve and your eating is better you can discuss stopping it and just doing lifestyle modification  3. Blood tests - labs were drawn to check for autoimmune conditions that could be contributing    IF you received an x-ray today, you will receive an invoice from [facility]. Please contact [facility] at [phone] with questions or concerns regarding your invoice.   IF you received labwork today, you will receive an invoice from [facility]. Please contact [facility] at [phone] with questions or concerns regarding your invoice.   Our billing staff will not be able to assist you with questions regarding bills from these companies.  You will be contacted with the lab results as soon as they are available. The fastest way to get your results is to activate your My Chart account. Instructions are located on the last page of this paperwork. If you have not heard from us regarding the results in 2 weeks, please contact this office.

## 2017-10-02 DIAGNOSIS — R279 Unspecified lack of coordination: Secondary | ICD-10-CM | POA: Diagnosis not present

## 2017-10-02 DIAGNOSIS — R262 Difficulty in walking, not elsewhere classified: Secondary | ICD-10-CM | POA: Diagnosis not present

## 2017-10-02 DIAGNOSIS — M6281 Muscle weakness (generalized): Secondary | ICD-10-CM | POA: Diagnosis not present

## 2017-10-02 DIAGNOSIS — R2689 Other abnormalities of gait and mobility: Secondary | ICD-10-CM | POA: Diagnosis not present

## 2017-10-02 LAB — SJOGREN'S SYNDROME ANTIBODS(SSA + SSB): ENA SSB (LA) Ab: <0.2 AI (ref 0.0–0.9)

## 2017-10-02 LAB — ANA W/REFLEX IF POSITIVE: Anti Nuclear Antibody(ANA): NEGATIVE

## 2017-10-04 DIAGNOSIS — R262 Difficulty in walking, not elsewhere classified: Secondary | ICD-10-CM | POA: Diagnosis not present

## 2017-10-04 DIAGNOSIS — R2689 Other abnormalities of gait and mobility: Secondary | ICD-10-CM | POA: Diagnosis not present

## 2017-10-04 DIAGNOSIS — M6281 Muscle weakness (generalized): Secondary | ICD-10-CM | POA: Diagnosis not present

## 2017-10-04 DIAGNOSIS — R279 Unspecified lack of coordination: Secondary | ICD-10-CM | POA: Diagnosis not present

## 2017-10-12 ENCOUNTER — Ambulatory Visit: Payer: Medicare Other | Admitting: Family Medicine

## 2017-10-16 DIAGNOSIS — R279 Unspecified lack of coordination: Secondary | ICD-10-CM | POA: Diagnosis not present

## 2017-10-16 DIAGNOSIS — R262 Difficulty in walking, not elsewhere classified: Secondary | ICD-10-CM | POA: Diagnosis not present

## 2017-10-16 DIAGNOSIS — R2689 Other abnormalities of gait and mobility: Secondary | ICD-10-CM | POA: Diagnosis not present

## 2017-10-16 DIAGNOSIS — M6281 Muscle weakness (generalized): Secondary | ICD-10-CM | POA: Diagnosis not present

## 2017-10-17 ENCOUNTER — Encounter: Payer: Self-pay | Admitting: Radiology

## 2017-10-18 DIAGNOSIS — M6281 Muscle weakness (generalized): Secondary | ICD-10-CM | POA: Diagnosis not present

## 2017-10-18 DIAGNOSIS — R279 Unspecified lack of coordination: Secondary | ICD-10-CM | POA: Diagnosis not present

## 2017-10-18 DIAGNOSIS — R262 Difficulty in walking, not elsewhere classified: Secondary | ICD-10-CM | POA: Diagnosis not present

## 2017-10-25 DIAGNOSIS — R279 Unspecified lack of coordination: Secondary | ICD-10-CM | POA: Diagnosis not present

## 2017-10-25 DIAGNOSIS — R2689 Other abnormalities of gait and mobility: Secondary | ICD-10-CM | POA: Diagnosis not present

## 2017-10-25 DIAGNOSIS — M6281 Muscle weakness (generalized): Secondary | ICD-10-CM | POA: Diagnosis not present

## 2017-10-25 DIAGNOSIS — R262 Difficulty in walking, not elsewhere classified: Secondary | ICD-10-CM | POA: Diagnosis not present

## 2017-11-01 ENCOUNTER — Ambulatory Visit (INDEPENDENT_AMBULATORY_CARE_PROVIDER_SITE_OTHER): Payer: Medicare Other | Admitting: Family Medicine

## 2017-11-01 ENCOUNTER — Encounter: Payer: Self-pay | Admitting: Family Medicine

## 2017-11-01 ENCOUNTER — Other Ambulatory Visit: Payer: Self-pay

## 2017-11-01 VITALS — BP 120/78 | HR 70 | Temp 98.6°F | Resp 16 | Ht 69.0 in | Wt 173.8 lb

## 2017-11-01 DIAGNOSIS — I10 Essential (primary) hypertension: Secondary | ICD-10-CM | POA: Diagnosis not present

## 2017-11-01 DIAGNOSIS — M3320 Polymyositis, organ involvement unspecified: Secondary | ICD-10-CM | POA: Diagnosis not present

## 2017-11-01 DIAGNOSIS — F4321 Adjustment disorder with depressed mood: Secondary | ICD-10-CM | POA: Diagnosis not present

## 2017-11-01 DIAGNOSIS — E7800 Pure hypercholesterolemia, unspecified: Secondary | ICD-10-CM | POA: Diagnosis not present

## 2017-11-01 NOTE — Progress Notes (Signed)
3/7/20192:04 PM  [name] 05/01/1976, 42 y.o. female [id number]  Chief Complaint  Patient presents with  . Hypertension    follow up   . Hyperlipidemia    follow up     HPI:   Patient is a 42 y.o. female with past medical history significant for polymyositis, HTN and HLP who presents today for followup on HTN and HLP  She saw neuro in Jan Neuro was concerned with pred having been stopped and starting of crestor Plan was to trend CK q 2 months  She otherwise feels well, better than last visit She is doing PT, feels stronger Denies any rashes or increased muscle weakness She is involved in counseling, not taking antidepressants  She has no acute concerns today  [facility] 2/9 11/01/2017 10/01/2017 08/03/2017  Decreased Interest 0 0 0  Down, Depressed, Hopeless 0 0 0  PHQ - 2 Score 0 0 0    Allergies  Allergen Reactions  . Buspirone Nausea Only  . Paxil  [Paroxetine Hcl] Nausea Only    Prior to Admission medications   Medication Sig Start Date End Date Taking? Authorizing Provider  ezetimibe (ZETIA) 10 MG tablet Take 1 tablet (10 mg total) by mouth daily. 08/03/17  Yes Myles LippsSantiago, Labrenda Lasky M, MD  HYDROcodone-acetaminophen (NORCO) 7.5-325 MG tablet Take 1 tablet by mouth 2 (two) times daily as needed for moderate pain.   Yes [provider]  hydrOXYzine (ATARAX/VISTARIL) 25 MG tablet Take 1 tablet (25 mg total) by mouth 3 (three) times daily as needed for anxiety. 04/25/17  Yes Money, Gerlene Burdockravis B, FNP  lisinopril (PRINIVIL,ZESTRIL) 10 MG tablet Take 1 tablet (10 mg total) by mouth daily. 08/03/17  Yes Myles LippsSantiago, Joeziah Voit M, MD  Multiple Vitamins-Minerals (ONE-A-DAY VITACRAVES ADULT) CHEW Chew 1 each by mouth daily.   Yes [provider]  Olopatadine HCl 0.2 % SOLN Apply 1 drop to eye daily. 10/01/17  Yes Stallings, Zoe A, MD  omeprazole (PRILOSEC) 40 MG capsule Take 40 mg by mouth daily.   Yes [provider]  rosuvastatin (CRESTOR) 5 MG tablet Take  5 mg by mouth daily.   Yes [provider]  valACYclovir (VALTREX) 500 MG tablet Take 1 tablet (500 mg total) by mouth 2 (two) times daily. for 3 days at onset of symptoms related with an outbreak 07/20/17  Yes Myles LippsSantiago, Melquan Ernsberger M, MD    Past Medical History:  Diagnosis Date  . Anxiety   . Hypertension   . Polymastia   . Polymyositis (HCC) 2009    Past Surgical History:  Procedure Laterality Date  . BRAIN SURGERY      Social History   Tobacco Use  . Smoking status: Never Smoker  . Smokeless tobacco: Never Used  Substance Use Topics  . Alcohol use: No    Family History  Problem Relation Age of Onset  . Hepatitis Mother   . Diabetes Paternal Uncle   . Diabetes Maternal Grandmother   . Heart disease Paternal Grandfather     Review of Systems  Constitutional: Negative for chills and fever.  Respiratory: Negative for cough and shortness of breath.   Cardiovascular: Negative for chest pain, palpitations and leg swelling.  Gastrointestinal: Negative for abdominal pain, nausea and vomiting.  Musculoskeletal: Negative for myalgias.  Skin: Negative for rash.     OBJECTIVE:  Blood pressure 120/78, pulse 70, temperature 98.6 F (37 C), resp. rate 16, height 5\' 9"  (1.753 m), weight 173 lb 12.8 oz (78.8 kg), SpO2 100 %.  Physical Exam  Constitutional: She is oriented to person, place, and time and well-developed, well-nourished, and in no distress.  HENT:  Head: Normocephalic and atraumatic.  Mouth/Throat: Oropharynx is clear and moist. No oropharyngeal exudate.  Eyes: EOM are normal. Pupils are equal, round, and reactive to light. No scleral icterus.  Neck: Neck supple.  Cardiovascular: Normal rate, regular rhythm and normal heart sounds. Exam reveals no gallop and no friction rub.  No murmur heard. Pulmonary/Chest: Effort normal and breath sounds normal. She has no wheezes. She has no rales.  Musculoskeletal: She exhibits no edema.  Neurological: She is alert and  oriented to person, place, and time.  Uses cane  Skin: Skin is warm and dry.    ASSESSMENT and PLAN  1. HYPERCHOLESTEROLEMIA Neuro concerned about statin and risk of myalgias in patient with polymyositis. LDL of 173 on zetia, so low dose crestor was started. Patient tolerating well. Plan is to monitor CK. Consider referral to endo is unable to use statins. - Lipid panel  2. HYPERTENSION, BENIGN ESSENTIAL At goal, continue current regime  3. POLYMYOSITIS Stable, CK managed by neuro. Continue with outpatient PT - CK; Standing  4. Adjustment disorder with depressed mood Patient reports improved mood and outlook with counseling. Has stopped medications as not needed. Continue current regime.  Other orders - CK - Specimen status report  Return in about 6 months (around 05/04/2018).    Myles Lipps, MD Primary Care at [facility] [address] [place], [place] [address] Ph.  [phone] Fax [phone]

## 2017-11-01 NOTE — Patient Instructions (Signed)
     IF you received an x-ray today, you will receive an invoice from [facility]. Please contact [facility] at [phone] with questions or concerns regarding your invoice.   IF you received labwork today, you will receive an invoice from [facility]. Please contact [facility] at [phone] with questions or concerns regarding your invoice.   Our billing staff will not be able to assist you with questions regarding bills from these companies.  You will be contacted with the lab results as soon as they are available. The fastest way to get your results is to activate your My Chart account. Instructions are located on the last page of this paperwork. If you have not heard from us regarding the results in 2 weeks, please contact this office.     

## 2017-11-02 LAB — LIPID PANEL
Chol/HDL Ratio: 3.6 ratio (ref 0.0–4.4)
Cholesterol, Total: 203 mg/dL — ABNORMAL HIGH (ref 100–199)
HDL: 56 mg/dL (ref >39)
LDL Calculated: 133 mg/dL — ABNORMAL HIGH (ref 0–99)
Triglycerides: 68 mg/dL (ref 0–149)
VLDL Cholesterol Cal: 14 mg/dL (ref 5–40)

## 2017-11-05 ENCOUNTER — Encounter: Payer: Self-pay | Admitting: Family Medicine

## 2017-11-06 DIAGNOSIS — R279 Unspecified lack of coordination: Secondary | ICD-10-CM | POA: Diagnosis not present

## 2017-11-06 DIAGNOSIS — M6281 Muscle weakness (generalized): Secondary | ICD-10-CM | POA: Diagnosis not present

## 2017-11-06 DIAGNOSIS — R262 Difficulty in walking, not elsewhere classified: Secondary | ICD-10-CM | POA: Diagnosis not present

## 2017-11-06 DIAGNOSIS — R2689 Other abnormalities of gait and mobility: Secondary | ICD-10-CM | POA: Diagnosis not present

## 2017-11-07 LAB — SPECIMEN STATUS REPORT

## 2017-11-07 LAB — CK: Total CK: 374 U/L — ABNORMAL HIGH (ref 24–173)

## 2017-11-08 DIAGNOSIS — M6281 Muscle weakness (generalized): Secondary | ICD-10-CM | POA: Diagnosis not present

## 2017-11-08 DIAGNOSIS — R262 Difficulty in walking, not elsewhere classified: Secondary | ICD-10-CM | POA: Diagnosis not present

## 2017-11-08 DIAGNOSIS — R279 Unspecified lack of coordination: Secondary | ICD-10-CM | POA: Diagnosis not present

## 2017-11-08 DIAGNOSIS — R2689 Other abnormalities of gait and mobility: Secondary | ICD-10-CM | POA: Diagnosis not present

## 2017-11-20 DIAGNOSIS — R2689 Other abnormalities of gait and mobility: Secondary | ICD-10-CM | POA: Diagnosis not present

## 2017-11-20 DIAGNOSIS — R262 Difficulty in walking, not elsewhere classified: Secondary | ICD-10-CM | POA: Diagnosis not present

## 2017-11-20 DIAGNOSIS — M6281 Muscle weakness (generalized): Secondary | ICD-10-CM | POA: Diagnosis not present

## 2017-11-20 DIAGNOSIS — R279 Unspecified lack of coordination: Secondary | ICD-10-CM | POA: Diagnosis not present

## 2017-11-27 DIAGNOSIS — R2689 Other abnormalities of gait and mobility: Secondary | ICD-10-CM | POA: Diagnosis not present

## 2017-11-27 DIAGNOSIS — R279 Unspecified lack of coordination: Secondary | ICD-10-CM | POA: Diagnosis not present

## 2017-11-27 DIAGNOSIS — R262 Difficulty in walking, not elsewhere classified: Secondary | ICD-10-CM | POA: Diagnosis not present

## 2017-11-27 DIAGNOSIS — M6281 Muscle weakness (generalized): Secondary | ICD-10-CM | POA: Diagnosis not present

## 2017-12-04 DIAGNOSIS — M6281 Muscle weakness (generalized): Secondary | ICD-10-CM | POA: Diagnosis not present

## 2017-12-04 DIAGNOSIS — R2689 Other abnormalities of gait and mobility: Secondary | ICD-10-CM | POA: Diagnosis not present

## 2017-12-04 DIAGNOSIS — R262 Difficulty in walking, not elsewhere classified: Secondary | ICD-10-CM | POA: Diagnosis not present

## 2017-12-04 DIAGNOSIS — R279 Unspecified lack of coordination: Secondary | ICD-10-CM | POA: Diagnosis not present

## 2017-12-11 DIAGNOSIS — R279 Unspecified lack of coordination: Secondary | ICD-10-CM | POA: Diagnosis not present

## 2017-12-11 DIAGNOSIS — R2689 Other abnormalities of gait and mobility: Secondary | ICD-10-CM | POA: Diagnosis not present

## 2017-12-11 DIAGNOSIS — M6281 Muscle weakness (generalized): Secondary | ICD-10-CM | POA: Diagnosis not present

## 2017-12-11 DIAGNOSIS — R262 Difficulty in walking, not elsewhere classified: Secondary | ICD-10-CM | POA: Diagnosis not present

## 2017-12-14 ENCOUNTER — Other Ambulatory Visit: Payer: Self-pay

## 2017-12-14 ENCOUNTER — Ambulatory Visit (INDEPENDENT_AMBULATORY_CARE_PROVIDER_SITE_OTHER): Payer: Medicare Other | Admitting: Family Medicine

## 2017-12-14 ENCOUNTER — Encounter: Payer: Self-pay | Admitting: Family Medicine

## 2017-12-14 VITALS — BP 142/90 | HR 82 | Temp 98.3°F | Ht 68.5 in | Wt 173.8 lb

## 2017-12-14 DIAGNOSIS — B360 Pityriasis versicolor: Secondary | ICD-10-CM

## 2017-12-14 DIAGNOSIS — N946 Dysmenorrhea, unspecified: Secondary | ICD-10-CM | POA: Diagnosis not present

## 2017-12-14 MED ORDER — IBUPROFEN 600 MG PO TABS: 600.0000 mg | ORAL_TABLET | Freq: Three times a day (TID) | ORAL | 1 refills | Status: DC | PRN

## 2017-12-14 MED ORDER — KETOCONAZOLE 2 % EX CREA: 1.0000 "application " | TOPICAL_CREAM | Freq: Every day | CUTANEOUS | 0 refills | Status: DC

## 2017-12-14 NOTE — Progress Notes (Signed)
4/19/201911:32 AM  [name] [birth year], 42 y.o. female [id number]  Chief Complaint  Patient presents with  . Rash    rash on right breast and now on back that has gotten larger with itching. Using topical ointment Mupirocin with no resolve    HPI:   Patient is a 42 y.o. female who presents today for over a week of itch rash on right breast and back, growing, spreading. Has been putting mupirocin ointment on without much resolution. Nobody else in the home has similar rash.   No rash on face or nails No fever, chills, malaise Has h/o polymyositis  Also requesting refill of ibuprofen for menstrual cramps  Depression screen [facility] 2/9 12/14/2017 11/01/2017 10/01/2017  Decreased Interest 0 0 0  Down, Depressed, Hopeless 0 0 0  PHQ - 2 Score 0 0 0    Allergies  Allergen Reactions  . Buspirone Nausea Only  . Paxil  [Paroxetine Hcl] Nausea Only    Prior to Admission medications   Medication Sig Start Date End Date Taking? Authorizing Provider  ezetimibe (ZETIA) 10 MG tablet Take 1 tablet (10 mg total) by mouth daily. 08/03/17  Yes Myles Lipps, MD  HYDROcodone-acetaminophen (NORCO) 7.5-325 MG tablet Take 1 tablet by mouth 2 (two) times daily as needed for moderate pain.   Yes [provider]  lisinopril (PRINIVIL,ZESTRIL) 10 MG tablet Take 1 tablet (10 mg total) by mouth daily. 08/03/17  Yes Myles Lipps, MD  Multiple Vitamins-Minerals (ONE-A-DAY VITACRAVES ADULT) CHEW Chew 1 each by mouth daily.   Yes [provider]  Olopatadine HCl 0.2 % SOLN Apply 1 drop to eye daily. 10/01/17  Yes Stallings, Zoe A, MD  omeprazole (PRILOSEC) 40 MG capsule Take 40 mg by mouth daily.   Yes [provider]  rosuvastatin (CRESTOR) 5 MG tablet Take 5 mg by mouth daily.   Yes [provider]  valACYclovir (VALTREX) 500 MG tablet Take 1 tablet (500 mg total) by mouth 2 (two) times daily. for 3 days at onset of symptoms related with an outbreak 07/20/17  Yes  Myles Lipps, MD  hydrOXYzine (ATARAX/VISTARIL) 25 MG tablet Take 1 tablet (25 mg total) by mouth 3 (three) times daily as needed for anxiety. Patient not taking: Reported on 12/14/2017 04/25/17   Money, Gerlene Burdock, FNP    Past Medical History:  Diagnosis Date  . Anxiety   . Hypertension   . Polymastia   . Polymyositis (HCC) 2009    Past Surgical History:  Procedure Laterality Date  . BRAIN SURGERY      Social History   Tobacco Use  . Smoking status: Never Smoker  . Smokeless tobacco: Never Used  Substance Use Topics  . Alcohol use: No    Family History  Problem Relation Age of Onset  . Hepatitis Mother   . Diabetes Paternal Uncle   . Diabetes Maternal Grandmother   . Heart disease Paternal Grandfather     ROS Per hpi  OBJECTIVE:  Blood pressure (!) 142/90, pulse 82, temperature 98.3 F (36.8 C), temperature source Oral, height 5' 8.5" (1.74 m), weight 173 lb 12.8 oz (78.8 kg), last menstrual period 12/14/2017, SpO2 100 %.  Physical Exam  Constitutional: She is oriented to person, place, and time.  HENT:  Head: Normocephalic and atraumatic.  Mouth/Throat: Mucous membranes are normal.  Eyes: Pupils are equal, round, and reactive to light. EOM are normal. No scleral icterus.  Neck: Neck supple.  Pulmonary/Chest: Effort normal.  Neurological:  She is alert and oriented to person, place, and time.  Skin: Skin is warm and dry. Rash (breast and back with thick hyperpigmented slightly scaly plaques) noted.  Nursing note and vitals reviewed.    ASSESSMENT and PLAN  1. TINEA VERSICOLOR Discussed supportive measures, new meds r/se/b and RTC precautions. Patient educational handout given. - ketoconazole (NIZORAL) 2 % cream; Apply 1 application topically daily  2. Menstrual cramps. - ibuprofen (ADVIL,MOTRIN) 600 MG tablet; Take 1 tablet (600 mg total) by mouth every 8 (eight) hours as needed.  Return if symptoms worsen or fail to improve.    Myles LippsIrma M Santiago,  MD Primary Care at [facility] [address] [place], [place] [address] Ph.  [phone] Fax [phone]

## 2017-12-14 NOTE — Patient Instructions (Addendum)
     IF you received an x-ray today, you will receive an invoice from [facility]. Please contact [facility] at [phone] with questions or concerns regarding your invoice.   IF you received labwork today, you will receive an invoice from [facility]. Please contact [facility] at [phone] with questions or concerns regarding your invoice.   Our billing staff will not be able to assist you with questions regarding bills from these companies.  You will be contacted with the lab results as soon as they are available. The fastest way to get your results is to activate your My Chart account. Instructions are located on the last page of this paperwork. If you have not heard from us regarding the results in 2 weeks, please contact this office.     Tinea Versicolor Tinea versicolor is a skin infection that is caused by a type of yeast. It causes a rash that shows up as light or dark patches on the skin. It often occurs on the chest, back, neck, or upper arms. The condition usually does not cause other problems. In most cases, it goes away in a few weeks with treatment. The infection cannot be spread by person to another person. Follow these instructions at home:  Take medicines only as told by your doctor.  Scrub your skin every day with a dandruff shampoo as told by your doctor.  Do not scratch your skin in the rash area.  Avoid places that are hot and humid.  Do not use tanning booths.  Try to avoid sweating a lot. Contact a doctor if:  Your symptoms get worse.  You have a fever.  You have redness, swelling, or pain in the area of your rash.  You have fluid, blood, or pus coming from your rash.  Your rash comes back after treatment. This information is not intended to replace advice given to you by your health care provider. Make sure you discuss any questions you have with your health care provider. Document Released: 07/27/2008 Document Revised: 04/16/2016  Document Reviewed: 05/26/2014 Elsevier Interactive Patient Education  2018 [employer]

## 2017-12-18 DIAGNOSIS — R262 Difficulty in walking, not elsewhere classified: Secondary | ICD-10-CM | POA: Diagnosis not present

## 2017-12-18 DIAGNOSIS — R2689 Other abnormalities of gait and mobility: Secondary | ICD-10-CM | POA: Diagnosis not present

## 2017-12-18 DIAGNOSIS — R279 Unspecified lack of coordination: Secondary | ICD-10-CM | POA: Diagnosis not present

## 2017-12-18 DIAGNOSIS — M6281 Muscle weakness (generalized): Secondary | ICD-10-CM | POA: Diagnosis not present

## 2017-12-20 DIAGNOSIS — R2689 Other abnormalities of gait and mobility: Secondary | ICD-10-CM | POA: Diagnosis not present

## 2017-12-20 DIAGNOSIS — R262 Difficulty in walking, not elsewhere classified: Secondary | ICD-10-CM | POA: Diagnosis not present

## 2017-12-20 DIAGNOSIS — R279 Unspecified lack of coordination: Secondary | ICD-10-CM | POA: Diagnosis not present

## 2017-12-20 DIAGNOSIS — M6281 Muscle weakness (generalized): Secondary | ICD-10-CM | POA: Diagnosis not present

## 2017-12-22 ENCOUNTER — Other Ambulatory Visit: Payer: Self-pay | Admitting: Family Medicine

## 2017-12-24 NOTE — Telephone Encounter (Signed)
Refill request for olopatadine hcl 0.2 % denied filled on 10/01/17 with 3 refills so no refills should be needed and if so pt will need to be seen in office. Dgaddy, CMA

## 2017-12-24 NOTE — Telephone Encounter (Signed)
Olopatadine HCL 0.2% soln  LOV 10/01/17 with [facility] where this was started.  [id number] - [place], [place] - [address]

## 2017-12-25 DIAGNOSIS — R2689 Other abnormalities of gait and mobility: Secondary | ICD-10-CM | POA: Diagnosis not present

## 2017-12-25 DIAGNOSIS — R279 Unspecified lack of coordination: Secondary | ICD-10-CM | POA: Diagnosis not present

## 2017-12-25 DIAGNOSIS — R262 Difficulty in walking, not elsewhere classified: Secondary | ICD-10-CM | POA: Diagnosis not present

## 2017-12-25 DIAGNOSIS — M6281 Muscle weakness (generalized): Secondary | ICD-10-CM | POA: Diagnosis not present

## 2018-01-01 DIAGNOSIS — M6281 Muscle weakness (generalized): Secondary | ICD-10-CM | POA: Diagnosis not present

## 2018-01-01 DIAGNOSIS — R2689 Other abnormalities of gait and mobility: Secondary | ICD-10-CM | POA: Diagnosis not present

## 2018-01-01 DIAGNOSIS — R262 Difficulty in walking, not elsewhere classified: Secondary | ICD-10-CM | POA: Diagnosis not present

## 2018-01-01 DIAGNOSIS — R279 Unspecified lack of coordination: Secondary | ICD-10-CM | POA: Diagnosis not present

## 2018-01-02 ENCOUNTER — Other Ambulatory Visit: Payer: Self-pay

## 2018-01-02 ENCOUNTER — Ambulatory Visit (INDEPENDENT_AMBULATORY_CARE_PROVIDER_SITE_OTHER): Payer: Medicare Other | Admitting: Family Medicine

## 2018-01-02 ENCOUNTER — Encounter: Payer: Self-pay | Admitting: Family Medicine

## 2018-01-02 VITALS — BP 126/80 | HR 89 | Temp 99.4°F | Resp 18 | Ht 68.5 in | Wt 176.0 lb

## 2018-01-02 DIAGNOSIS — E7800 Pure hypercholesterolemia, unspecified: Secondary | ICD-10-CM | POA: Diagnosis not present

## 2018-01-02 DIAGNOSIS — Z1231 Encounter for screening mammogram for malignant neoplasm of breast: Secondary | ICD-10-CM

## 2018-01-02 DIAGNOSIS — E559 Vitamin D deficiency, unspecified: Secondary | ICD-10-CM

## 2018-01-02 DIAGNOSIS — L309 Dermatitis, unspecified: Secondary | ICD-10-CM

## 2018-01-02 DIAGNOSIS — M3320 Polymyositis, organ involvement unspecified: Secondary | ICD-10-CM | POA: Diagnosis not present

## 2018-01-02 MED ORDER — TRIAMCINOLONE ACETONIDE 0.1 % EX CREA: 1.0000 "application " | TOPICAL_CREAM | Freq: Two times a day (BID) | CUTANEOUS | 0 refills | Status: DC

## 2018-01-02 NOTE — Progress Notes (Signed)
5/8/20191:47 PM  [name] Sep 12, 1975, 42 y.o. female [id number]  Chief Complaint  Patient presents with  . Rash    follow up on back rash     HPI:   Patient is a 42 y.o. female with past medical history significant for myositis who presents today for followup on rash on back and nipple area.  She was treated as tenia versicolor, rash on nipples has completely resolved, back still has a thick patch She has no facial rash or changes to her nails Otherwise we are due to rechecking her CK and aldolase levels. She was started on atorvastatin due to elevated cholesterol. Neuro concerned with increased risk of myositis.  She denies any muscle pain or weakness. She continues to do home PT. She uses a cane. She is also requesting referral for mammogram. FHx maternal aunt with BC in her 21s  Patient denies any breast symptoms Patient found to have vit D deficiency, started on high dose weekly treatment for 12 weeks, has one tablet left  Fall Risk  12/14/2017 11/01/2017 10/01/2017 08/03/2017 07/20/2017  Falls in the past year? No No No No Yes  Injury with Fall? - - - - No     Depression screen [facility] 2/9 12/14/2017 11/01/2017 10/01/2017  Decreased Interest 0 0 0  Down, Depressed, Hopeless 0 0 0  PHQ - 2 Score 0 0 0    Allergies  Allergen Reactions  . Buspirone Nausea Only  . Paxil  [Paroxetine Hcl] Nausea Only    Prior to Admission medications   Medication Sig Start Date End Date Taking? Authorizing Provider  ezetimibe (ZETIA) 10 MG tablet Take 1 tablet (10 mg total) by mouth daily. 08/03/17  Yes Myles Lipps, MD  HYDROcodone-acetaminophen (NORCO) 7.5-325 MG tablet Take 1 tablet by mouth 2 (two) times daily as needed for moderate pain.   Yes [provider]  ibuprofen (ADVIL,MOTRIN) 600 MG tablet Take 1 tablet (600 mg total) by mouth every 8 (eight) hours as needed. 12/14/17  Yes Myles Lipps, MD  ketoconazole (NIZORAL) 2 % cream Apply 1 application topically daily. 12/14/17   Yes Myles Lipps, MD  lisinopril (PRINIVIL,ZESTRIL) 10 MG tablet Take 1 tablet (10 mg total) by mouth daily. 08/03/17  Yes Myles Lipps, MD  Multiple Vitamins-Minerals (ONE-A-DAY VITACRAVES ADULT) CHEW Chew 1 each by mouth daily.   Yes [provider]  Olopatadine HCl 0.2 % SOLN Apply 1 drop to eye daily. 10/01/17  Yes Stallings, Zoe A, MD  omeprazole (PRILOSEC) 40 MG capsule Take 40 mg by mouth daily.   Yes [provider]  rosuvastatin (CRESTOR) 5 MG tablet Take 5 mg by mouth daily.   Yes [provider]  valACYclovir (VALTREX) 500 MG tablet Take 1 tablet (500 mg total) by mouth 2 (two) times daily. for 3 days at onset of symptoms related with an outbreak 07/20/17  Yes Myles Lipps, MD    Past Medical History:  Diagnosis Date  . Anxiety   . Hyperlipidemia   . Hypertension   . Polymyositis (HCC) 2009  . Vitamin D deficiency     Past Surgical History:  Procedure Laterality Date  . BRAIN SURGERY      Social History   Tobacco Use  . Smoking status: Never Smoker  . Smokeless tobacco: Never Used  Substance Use Topics  . Alcohol use: No    Family History  Problem Relation Age of Onset  . Hepatitis Mother   . Diabetes Paternal  Uncle   . Diabetes Maternal Grandmother   . Heart disease Paternal Grandfather     Review of Systems  Constitutional: Negative for chills and fever.  Respiratory: Negative for cough and shortness of breath.   Cardiovascular: Negative for chest pain, palpitations and leg swelling.  Gastrointestinal: Negative for abdominal pain, nausea and vomiting.   Per hpi  OBJECTIVE:  Blood pressure 126/80, pulse 89, temperature 99.4 F (37.4 C), temperature source Oral, resp. rate 18, height 5' 8.5" (1.74 m), weight 176 lb (79.8 kg), last menstrual period 12/14/2017, SpO2 100 %.  Physical Exam  Gen: AAOx3, NAD Breast exam normal bilaterally, No masses, nipple discharge or skin changes.  No axillary, supra or  infraclavicular lymphadenopathy Skin: singly remaining lesion of originally rash      ASSESSMENT and PLAN  1. POLYMYOSITIS Patient asymptomatic, rechecking CK levels since started on statin therapy - CK - Aldolase - CK; Future  2. Vitamin D deficiency Rechecking level. Discussed foods high in vit d. Further treatment pending results. - Vitamin D, 25-hydroxy  3. HYPERCHOLESTEROLEMIA Rechecking level, if increase in CK will need to stop statin. Already on zetia. Highest LDL 173 in Nov 2018 - Comprehensive metabolic panel - Lipid panel  4. Dermatitis - triamcinolone cream (KENALOG) 0.1 %; Apply 1 application topically 2 (two) times daily.  5. Visit for screening mammogram - MM DIGITAL SCREENING BILATERAL; Future  Return in about 3 months (around 04/04/2018).    Myles Lipps, MD Primary Care at [facility] [address] [place], [place] [address] Ph.  [phone] Fax [phone]

## 2018-01-02 NOTE — Patient Instructions (Signed)
     IF you received an x-ray today, you will receive an invoice from [facility]. Please contact [facility] at [phone] with questions or concerns regarding your invoice.   IF you received labwork today, you will receive an invoice from [facility]. Please contact [facility] at [phone] with questions or concerns regarding your invoice.   Our billing staff will not be able to assist you with questions regarding bills from these companies.  You will be contacted with the lab results as soon as they are available. The fastest way to get your results is to activate your My Chart account. Instructions are located on the last page of this paperwork. If you have not heard from us regarding the results in 2 weeks, please contact this office.     

## 2018-01-03 LAB — COMPREHENSIVE METABOLIC PANEL
ALT: 19 IU/L (ref 0–32)
AST: 28 IU/L (ref 0–40)
Albumin/Globulin Ratio: 1.4 (ref 1.2–2.2)
Albumin: 4.3 g/dL (ref 3.5–5.5)
Alkaline Phosphatase: 78 IU/L (ref 39–117)
BUN/Creatinine Ratio: 14 (ref 9–23)
BUN: 7 mg/dL (ref 6–24)
Bilirubin Total: <0.2 mg/dL (ref 0.0–1.2)
CO2: 21 mmol/L (ref 20–29)
Calcium: 8.8 mg/dL (ref 8.7–10.2)
Chloride: 105 mmol/L (ref 96–106)
Creatinine, Ser: 0.49 mg/dL — ABNORMAL LOW (ref 0.57–1.00)
GFR calc Af Amer: 141 mL/min/{1.73_m2} (ref >59)
GFR calc non Af Amer: 122 mL/min/{1.73_m2} (ref >59)
Globulin, Total: 3 g/dL (ref 1.5–4.5)
Glucose: 80 mg/dL (ref 65–99)
Potassium: 3.9 mmol/L (ref 3.5–5.2)
Sodium: 140 mmol/L (ref 134–144)
Total Protein: 7.3 g/dL (ref 6.0–8.5)

## 2018-01-03 LAB — LIPID PANEL
Chol/HDL Ratio: 4.1 ratio (ref 0.0–4.4)
Cholesterol, Total: 207 mg/dL — ABNORMAL HIGH (ref 100–199)
HDL: 50 mg/dL (ref >39)
LDL Calculated: 141 mg/dL — ABNORMAL HIGH (ref 0–99)
Triglycerides: 79 mg/dL (ref 0–149)
VLDL Cholesterol Cal: 16 mg/dL (ref 5–40)

## 2018-01-03 LAB — ALDOLASE: Aldolase: 5.9 U/L (ref 3.3–10.3)

## 2018-01-03 LAB — CK: Total CK: 787 U/L (ref 24–173)

## 2018-01-03 LAB — VITAMIN D 25 HYDROXY (VIT D DEFICIENCY, FRACTURES): Vit D, 25-Hydroxy: 20.9 ng/mL — ABNORMAL LOW (ref 30.0–100.0)

## 2018-01-03 MED ORDER — VITAMIN D 50 MCG (2000 UT) PO TABS: 4000.0000 [IU] | ORAL_TABLET | Freq: Every day | ORAL | 0 refills | Status: DC

## 2018-01-08 DIAGNOSIS — R2689 Other abnormalities of gait and mobility: Secondary | ICD-10-CM | POA: Diagnosis not present

## 2018-01-08 DIAGNOSIS — M6281 Muscle weakness (generalized): Secondary | ICD-10-CM | POA: Diagnosis not present

## 2018-01-08 DIAGNOSIS — R279 Unspecified lack of coordination: Secondary | ICD-10-CM | POA: Diagnosis not present

## 2018-01-08 DIAGNOSIS — R262 Difficulty in walking, not elsewhere classified: Secondary | ICD-10-CM | POA: Diagnosis not present

## 2018-01-11 ENCOUNTER — Telehealth: Payer: Self-pay

## 2018-01-11 NOTE — Telephone Encounter (Signed)
Forms faxed back to [facility]

## 2018-01-14 ENCOUNTER — Encounter: Payer: Self-pay | Admitting: Family Medicine

## 2018-01-15 DIAGNOSIS — M6281 Muscle weakness (generalized): Secondary | ICD-10-CM | POA: Diagnosis not present

## 2018-01-15 DIAGNOSIS — R2689 Other abnormalities of gait and mobility: Secondary | ICD-10-CM | POA: Diagnosis not present

## 2018-01-15 DIAGNOSIS — R279 Unspecified lack of coordination: Secondary | ICD-10-CM | POA: Diagnosis not present

## 2018-01-15 DIAGNOSIS — R262 Difficulty in walking, not elsewhere classified: Secondary | ICD-10-CM | POA: Diagnosis not present

## 2018-01-17 DIAGNOSIS — R279 Unspecified lack of coordination: Secondary | ICD-10-CM | POA: Diagnosis not present

## 2018-01-17 DIAGNOSIS — R2689 Other abnormalities of gait and mobility: Secondary | ICD-10-CM | POA: Diagnosis not present

## 2018-01-17 DIAGNOSIS — R262 Difficulty in walking, not elsewhere classified: Secondary | ICD-10-CM | POA: Diagnosis not present

## 2018-01-17 DIAGNOSIS — M6281 Muscle weakness (generalized): Secondary | ICD-10-CM | POA: Diagnosis not present

## 2018-01-18 ENCOUNTER — Ambulatory Visit
Admission: RE | Admit: 2018-01-18 | Discharge: 2018-01-18 | Disposition: A | Discharge status: Home / Self Care | Payer: Self-pay | Source: Ambulatory Visit | Attending: Family Medicine | Admitting: Family Medicine

## 2018-01-18 DIAGNOSIS — Z1231 Encounter for screening mammogram for malignant neoplasm of breast: Secondary | ICD-10-CM | POA: Diagnosis not present

## 2018-01-22 DIAGNOSIS — M6281 Muscle weakness (generalized): Secondary | ICD-10-CM | POA: Diagnosis not present

## 2018-01-22 DIAGNOSIS — R262 Difficulty in walking, not elsewhere classified: Secondary | ICD-10-CM | POA: Diagnosis not present

## 2018-01-24 ENCOUNTER — Telehealth: Payer: Self-pay

## 2018-01-24 DIAGNOSIS — M6281 Muscle weakness (generalized): Secondary | ICD-10-CM | POA: Diagnosis not present

## 2018-01-24 DIAGNOSIS — R279 Unspecified lack of coordination: Secondary | ICD-10-CM | POA: Diagnosis not present

## 2018-01-24 DIAGNOSIS — R2689 Other abnormalities of gait and mobility: Secondary | ICD-10-CM | POA: Diagnosis not present

## 2018-01-24 DIAGNOSIS — R262 Difficulty in walking, not elsewhere classified: Secondary | ICD-10-CM | POA: Diagnosis not present

## 2018-01-24 NOTE — Telephone Encounter (Signed)
Copied from CRM [id number]. Topic: General - Other >> Jan 23, 2018  2:14 PM Trula Slade wrote: Reason for CRM:   Patient would like a callback from the office concerning when she is suppose to come in to do labs  LMOVM for pt to return asap for CK labs only.  Advised to stop the Crestor.  Prior message 5/9 was also left on VM.

## 2018-01-24 NOTE — Telephone Encounter (Signed)
Neurologist's office was able to get a hold of her. Stopped crestor and started prednisone.

## 2018-01-29 DIAGNOSIS — R279 Unspecified lack of coordination: Secondary | ICD-10-CM | POA: Diagnosis not present

## 2018-01-29 DIAGNOSIS — R262 Difficulty in walking, not elsewhere classified: Secondary | ICD-10-CM | POA: Diagnosis not present

## 2018-01-29 DIAGNOSIS — M6281 Muscle weakness (generalized): Secondary | ICD-10-CM | POA: Diagnosis not present

## 2018-01-29 DIAGNOSIS — R2689 Other abnormalities of gait and mobility: Secondary | ICD-10-CM | POA: Diagnosis not present

## 2018-02-28 ENCOUNTER — Other Ambulatory Visit: Payer: Self-pay | Admitting: Family Medicine

## 2018-03-11 ENCOUNTER — Ambulatory Visit (INDEPENDENT_AMBULATORY_CARE_PROVIDER_SITE_OTHER): Payer: Medicare Other | Admitting: Physician Assistant

## 2018-03-11 ENCOUNTER — Encounter: Payer: Self-pay | Admitting: Physician Assistant

## 2018-03-11 VITALS — BP 135/83 | HR 62 | Temp 97.6°F | Resp 16 | Ht 68.5 in | Wt 182.8 lb

## 2018-03-11 DIAGNOSIS — R202 Paresthesia of skin: Secondary | ICD-10-CM | POA: Diagnosis not present

## 2018-03-11 DIAGNOSIS — R200 Anesthesia of skin: Secondary | ICD-10-CM

## 2018-03-11 DIAGNOSIS — M3320 Polymyositis, organ involvement unspecified: Secondary | ICD-10-CM | POA: Diagnosis not present

## 2018-03-11 MED ORDER — PREDNISONE 20 MG PO TABS: ORAL_TABLET | ORAL | 0 refills | Status: DC

## 2018-03-11 NOTE — Progress Notes (Signed)
[name]  MRN: [id number] DOB: 07/18/1976  PCP: Myles LippsSantiago, Irma M, MD  Subjective:  Pt is a 42 year old female who  has a past medical history of Anxiety, Hyperlipidemia, Hypertension, Polymyositis (HCC) (2009), and Vitamin D deficiency. who presents c/o right arm pain x 2 weeks. Describes a "tingling, like a burning" of the lower 2/3 of her right anterior arm. Denies increased muscle weakness or decreased grip strength.   H/o Polymyositis - Prednisone 15mg  daily currently. She was supposed to RTC for CK level check 2 months ago. Would like this done today. Followed by neurologist Dr. Alphonzo Dublinaress.    Review of Systems  Musculoskeletal: Negative for arthralgias, neck pain and neck stiffness.  Neurological: Positive for numbness ("tingling"). Negative for weakness.    Patient Active Problem List   Diagnosis Date Noted  . Vitamin D deficiency 08/03/2017  . MDD (major depressive disorder) 04/23/2017  . Low bone mass 01/30/2014  . COMMON VARIABLE IMMUNODEFICIENCY 05/25/2010  . INFECTION, SKIN AND SOFT TISSUE 05/19/2010  . CONJUNCTIVITIS, ALLERGIC 07/29/2009  . SKIN RASH 06/17/2009  . TINEA VERSICOLOR 04/14/2009  . NASOPHARYNGITIS 04/14/2009  . DERMATOMYOSITIS 02/25/2009  . CANDIDIASIS 02/19/2009  . INGROWN NAIL 02/01/2009  . HYPERCHOLESTEROLEMIA 01/28/2009  . RENAL CALCULUS, RIGHT 06/18/2008  . HYPERTENSION, BENIGN ESSENTIAL 06/16/2008  . POLYMYOSITIS 06/16/2008  . INSOMNIA 02/19/2008  . DERMATOMYCOSIS 01/08/2008  . 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 Left-sided weakness 11/18/2007  . DISC DISEASE, LUMBAR 11/18/2007  . MEMORY LOSS 11/18/2007  . BACK PAIN 11/11/2007  . ARM NUMBNESS 11/11/2007    Current Outpatient Medications on File Prior to Visit  Medication Sig Dispense Refill  . ezetimibe (ZETIA) 10 MG tablet TAKE 1 TABLET BY MOUTH EVERY DAY 90 tablet 1  . HYDROcodone-acetaminophen (NORCO) 7.5-325 MG tablet Take 1 tablet by mouth 2 (two) times daily as needed for moderate  pain.    Marland Kitchen. ibuprofen (ADVIL,MOTRIN) 600 MG tablet Take 1 tablet (600 mg total) by mouth every 8 (eight) hours as needed. 30 tablet 1  . lisinopril (PRINIVIL,ZESTRIL) 10 MG tablet Take 1 tablet (10 mg total) by mouth daily. 90 tablet 1  . Multiple Vitamins-Minerals (ONE-A-DAY VITACRAVES ADULT) CHEW Chew 1 each by mouth daily.    . Olopatadine HCl 0.2 % SOLN Apply 1 drop to eye daily. 2.5 mL 3  . omeprazole (PRILOSEC) 40 MG capsule Take 40 mg by mouth daily.    Marland Kitchen. triamcinolone cream (KENALOG) 0.1 % Apply 1 application topically 2 (two) times daily. 45 g 0  . valACYclovir (VALTREX) 500 MG tablet Take 1 tablet (500 mg total) by mouth 2 (two) times daily. for 3 days at onset of symptoms related with an outbreak 30 tablet 2  . Cholecalciferol (VITAMIN D) 2000 units tablet Take 2 tablets (4,000 Units total) by mouth daily. (Patient not taking: Reported on 03/11/2018) 60 tablet 0   No current facility-administered medications on file prior to visit.     Allergies  Allergen Reactions  . Buspirone Nausea Only  . Paxil  [Paroxetine Hcl] Nausea Only     Objective:  BP 135/83   Pulse 62   Temp 97.6 F (36.4 C) (Oral)   Resp 16   Ht 5' 8.5" (1.74 m)   Wt 182 lb 12.8 oz (82.9 kg)   SpO2 100%   BMI 27.39 kg/m   Physical Exam  Constitutional: She is oriented to person, place, and time. No distress.  Uses a can for ambulation.   Musculoskeletal:  Cervical back: She exhibits normal range of motion, no tenderness and no bony tenderness.  Neurological: She is alert and oriented to person, place, and time. She has normal strength. A sensory deficit is present.  Decreased sensation right lateral foream.  4/5 strength b/l arms and hands.   Skin: Skin is warm and dry.  Psychiatric: Judgment normal.  Vitals reviewed.   Assessment and Plan :  1. Numbness and tingling of right arm - Pt presents c/o right arm tingling x 2 weeks. No muscle weakness on PE. Plan to start prednisone taper and return  to her 15mg /day regimen. If no improvement will consider imaging. Advised f/u with her PCP.  - predniSONE (DELTASONE) 20 MG tablet; Take 60mg  x 3 days, then take 40mg  x 3 days, then start back on your current 15mg  regimen  Dispense: 15 tablet; Refill: 0  2. Polymyositis (HCC) - She did not f/u two months ago for repeat labs. Will do this today.  - CK    Marco Collie, PA-C  Primary Care at [facility] 03/11/2018 5:52 PM

## 2018-03-11 NOTE — Patient Instructions (Addendum)
  Take '60mg'$  x 3 days, then take '40mg'$  x 3 days, then continue back on your current '15mg'$ /day regimen.  Please make follow-up appointment with Dr. Pamella Pert in 6-9 days. Come back soon if you develop muscle weakness.   Thank you for coming in today. I hope you feel we met your needs.  Feel free to call PCP if you have any questions or further requests.  Please consider signing up for MyChart if you do not already have it, as this is a great way to communicate with me.  Best,  Whitney McVey, PA-C   IF you received an x-ray today, you will receive an invoice from [facility]. Please contact [facility] at [phone] with questions or concerns regarding your invoice.   IF you received labwork today, you will receive an invoice from [facility]. Please contact [facility] at [phone] with questions or concerns regarding your invoice.   Our billing staff will not be able to assist you with questions regarding bills from these companies.  You will be contacted with the lab results as soon as they are available. The fastest way to get your results is to activate your My Chart account. Instructions are located on the last page of this paperwork. If you have not heard from [place] regarding the results in 2 weeks, please contact this office.

## 2018-03-12 ENCOUNTER — Telehealth: Payer: Self-pay | Admitting: Family Medicine

## 2018-03-12 LAB — CK: Total CK: 1054 U/L (ref 24–173)

## 2018-03-12 NOTE — Progress Notes (Signed)
Called and spoke to pt regarding lab results. Forwarded results to neurologist's office. Advised pt to call neurologist for f/u appt. Dr. Leretha PolSantiago informed of results as well.

## 2018-03-12 NOTE — Telephone Encounter (Signed)
[name]  CK of  1054  Dated 03/11/18. Flow and lab notified at [facility] at [facility].

## 2018-03-28 DIAGNOSIS — Z7952 Long term (current) use of systemic steroids: Secondary | ICD-10-CM | POA: Diagnosis not present

## 2018-03-28 DIAGNOSIS — M3320 Polymyositis, organ involvement unspecified: Secondary | ICD-10-CM | POA: Diagnosis not present

## 2018-03-28 DIAGNOSIS — T887XXD Unspecified adverse effect of drug or medicament, subsequent encounter: Secondary | ICD-10-CM | POA: Diagnosis not present

## 2018-03-28 DIAGNOSIS — Z79899 Other long term (current) drug therapy: Secondary | ICD-10-CM | POA: Diagnosis not present

## 2018-03-28 DIAGNOSIS — Q0700 Arnold-Chiari syndrome without spina bifida or hydrocephalus: Secondary | ICD-10-CM | POA: Diagnosis not present

## 2018-03-28 DIAGNOSIS — R748 Abnormal levels of other serum enzymes: Secondary | ICD-10-CM | POA: Diagnosis not present

## 2018-04-16 DIAGNOSIS — M3320 Polymyositis, organ involvement unspecified: Secondary | ICD-10-CM | POA: Diagnosis not present

## 2018-05-07 ENCOUNTER — Ambulatory Visit: Payer: Medicare Other | Admitting: Family Medicine

## 2018-05-08 ENCOUNTER — Encounter: Payer: Self-pay | Admitting: Family Medicine

## 2018-05-08 ENCOUNTER — Ambulatory Visit (INDEPENDENT_AMBULATORY_CARE_PROVIDER_SITE_OTHER): Payer: Medicare Other | Admitting: Family Medicine

## 2018-05-08 ENCOUNTER — Other Ambulatory Visit: Payer: Self-pay

## 2018-05-08 VITALS — BP 122/72 | HR 66 | Temp 98.0°F | Resp 16 | Wt 182.0 lb

## 2018-05-08 DIAGNOSIS — M3320 Polymyositis, organ involvement unspecified: Secondary | ICD-10-CM | POA: Diagnosis not present

## 2018-05-08 DIAGNOSIS — E559 Vitamin D deficiency, unspecified: Secondary | ICD-10-CM | POA: Diagnosis not present

## 2018-05-08 DIAGNOSIS — I10 Essential (primary) hypertension: Secondary | ICD-10-CM

## 2018-05-08 DIAGNOSIS — E7800 Pure hypercholesterolemia, unspecified: Secondary | ICD-10-CM | POA: Diagnosis not present

## 2018-05-08 MED ORDER — METHOTREXATE (PF) 25 MG/0.5ML ~~LOC~~ SOAJ: 25.0000 mg | SUBCUTANEOUS | Status: DC

## 2018-05-08 NOTE — Progress Notes (Signed)
9/11/20191:38 PM  [name] 03-Aug-1976, 42 y.o. female [id number]  Chief Complaint  Patient presents with  . Polymyositis    6 month follow-up     HPI:   Patient is a 42 y.o. female with past medical history significant for polymyositis, ACM, HLP, HTN, vitamin D who presents today for followup  Overall doing fine Has not noticed any changes in strength No falls, not dropping objects Uses cane Eating more and sleeping less on prednisone has been off BP meds for about a month, checks BP at home and similar to today Completed high dose vitamin d  Last saw neuro, Dr Alphonzo [place], aug 1st On rutixan infusion, started pred again Checking CK q 1 month   Fall Risk  05/08/2018 12/14/2017 11/01/2017 10/01/2017 08/03/2017  Falls in the past year? No No No No No  Injury with Fall? - - - - -     Depression screen [facility] 2/9 05/08/2018 03/11/2018 12/14/2017  Decreased Interest 0 0 0  Down, Depressed, Hopeless 0 0 0  PHQ - 2 Score 0 0 0    Allergies  Allergen Reactions  . Buspirone Nausea Only  . Paxil  [Paroxetine Hcl] Nausea Only    Prior to Admission medications   Medication Sig Start Date End Date Taking? Authorizing Provider  Cholecalciferol (VITAMIN D) 2000 units tablet Take 2 tablets (4,000 Units total) by mouth daily. 01/03/18  Yes Myles Lipps, MD  ezetimibe (ZETIA) 10 MG tablet TAKE 1 TABLET BY MOUTH EVERY DAY 03/01/18  Yes Myles Lipps, MD  HYDROcodone-acetaminophen (NORCO) 7.5-325 MG tablet Take 1 tablet by mouth 2 (two) times daily as needed for moderate pain.   Yes [provider]  ibuprofen (ADVIL,MOTRIN) 600 MG tablet Take 1 tablet (600 mg total) by mouth every 8 (eight) hours as needed. 12/14/17  Yes Myles Lipps, MD  lisinopril (PRINIVIL,ZESTRIL) 10 MG tablet Take 1 tablet (10 mg total) by mouth daily. 08/03/17  Yes Myles Lipps, MD  Multiple Vitamins-Minerals (ONE-A-DAY VITACRAVES ADULT) CHEW Chew 1 each by mouth daily.   Yes [provider]    Olopatadine HCl 0.2 % SOLN Apply 1 drop to eye daily. 10/01/17  Yes Stallings, Zoe A, MD  omeprazole (PRILOSEC) 40 MG capsule Take 40 mg by mouth daily.   Yes [provider]  predniSONE (DELTASONE) 20 MG tablet Take 60mg  x 3 days, then take 40mg  x 3 days, then start back on your current 15mg  regimen 03/11/18  Yes McVey, Madelaine Bhat, PA-C  triamcinolone cream (KENALOG) 0.1 % Apply 1 application topically 2 (two) times daily. 01/02/18  Yes Myles Lipps, MD  valACYclovir (VALTREX) 500 MG tablet Take 1 tablet (500 mg total) by mouth 2 (two) times daily. for 3 days at onset of symptoms related with an outbreak 07/20/17  Yes Myles Lipps, MD    Past Medical History:  Diagnosis Date  . Anxiety   . Hyperlipidemia   . Hypertension   . Polymyositis (HCC) 2009  . Vitamin D deficiency     Past Surgical History:  Procedure Laterality Date  . BRAIN SURGERY      Social History   Tobacco Use  . Smoking status: Never Smoker  . Smokeless tobacco: Never Used  Substance Use Topics  . Alcohol use: No    Family History  Problem Relation Age of Onset  . Hepatitis Mother   . Diabetes Paternal Uncle   . Diabetes Maternal Grandmother   . Heart disease Paternal  Grandfather   . Breast cancer Maternal Aunt        ? age of onset    Review of Systems  Constitutional: Negative for chills and fever.  Respiratory: Negative for cough and shortness of breath.   Cardiovascular: Negative for chest pain, palpitations and leg swelling.  Gastrointestinal: Negative for abdominal pain, nausea and vomiting.     OBJECTIVE:  Blood pressure 122/72, pulse 66, temperature 98 F (36.7 C), temperature source Oral, resp. rate 16, weight 182 lb (82.6 kg), SpO2 100 %. Body mass index is 27.27 kg/m.   Physical Exam  Constitutional: She is oriented to person, place, and time. She appears well-developed and well-nourished.  HENT:  Head: Normocephalic and atraumatic.  Mouth/Throat: Mucous  membranes are normal.  Eyes: Pupils are equal, round, and reactive to light. Conjunctivae and EOM are normal. No scleral icterus.  Neck: Neck supple.  Pulmonary/Chest: Effort normal.  Neurological: She is alert and oriented to person, place, and time.  Skin: Skin is warm and dry.  Psychiatric: She has a normal mood and affect.  Nursing note and vitals reviewed.   ASSESSMENT and PLAN  1. Polymyositis (HCC) Per neuro, on pred, mthx, rutixan - CK; Standing  2. Vitamin D deficiency Checking labs today, medications will be adjusted as needed.  - Vitamin D, 25-hydroxy  3. HYPERCHOLESTEROLEMIA Checking labs today, medications will be adjusted as needed.  - Lipid panel - TSH  4. HYPERTENSION, BENIGN ESSENTIAL Good BP off meds, continue checking BP at home. Requested that she have BP checked when she comes in for her monthly CK  - TSH  Return in about 6 months (around 11/06/2018).    Myles Lipps, MD Primary Care at [facility] [address] [place], [place] [address] Ph.  [phone] Fax [phone]

## 2018-05-08 NOTE — Patient Instructions (Addendum)
Stopping lisinopril When you come in for labs work, have them check your bp    If you have lab work done today you will be contacted with your lab results within the next 2 weeks.  If you have not heard from [place] then please contact us. The fastest way to get your results is to register for My Chart.   IF you received an x-ray today, you will receive an invoice from [facility]. Please contact [facility] at [phone] with questions or concerns regarding your invoice.   IF you received labwork today, you will receive an invoice from [facility]. Please contact [facility] at [phone] with questions or concerns regarding your invoice.   Our billing staff will not be able to assist you with questions regarding bills from these companies.  You will be contacted with the lab results as soon as they are available. The fastest way to get your results is to activate your My Chart account. Instructions are located on the last page of this paperwork. If you have not heard from [place] regarding the results in 2 weeks, please contact this office.

## 2018-05-09 LAB — LIPID PANEL
Chol/HDL Ratio: 5.7 ratio — ABNORMAL HIGH (ref 0.0–4.4)
Cholesterol, Total: 229 mg/dL — ABNORMAL HIGH (ref 100–199)
HDL: 40 mg/dL (ref >39)
LDL Calculated: 165 mg/dL — ABNORMAL HIGH (ref 0–99)
Triglycerides: 122 mg/dL (ref 0–149)
VLDL Cholesterol Cal: 24 mg/dL (ref 5–40)

## 2018-05-09 LAB — VITAMIN D 25 HYDROXY (VIT D DEFICIENCY, FRACTURES): Vit D, 25-Hydroxy: 17.3 ng/mL — ABNORMAL LOW (ref 30.0–100.0)

## 2018-05-09 LAB — TSH: TSH: 0.972 u[IU]/mL (ref 0.450–4.500)

## 2018-05-13 MED ORDER — VITAMIN D (ERGOCALCIFEROL) 1.25 MG (50000 UNIT) PO CAPS: 50000.0000 [IU] | ORAL_CAPSULE | ORAL | 0 refills | Status: DC

## 2018-05-13 NOTE — Addendum Note (Signed)
Addended by: Myles LippsSANTIAGO, Tip Atienza M on: 05/13/2018 10:18 PM   Modules accepted: Orders

## 2018-06-06 DIAGNOSIS — M60009 Infective myositis, unspecified site: Secondary | ICD-10-CM | POA: Diagnosis not present

## 2018-06-06 DIAGNOSIS — M3320 Polymyositis, organ involvement unspecified: Secondary | ICD-10-CM | POA: Diagnosis not present

## 2018-06-27 DIAGNOSIS — Z87798 Personal history of other (corrected) congenital malformations: Secondary | ICD-10-CM | POA: Diagnosis not present

## 2018-06-27 DIAGNOSIS — M3320 Polymyositis, organ involvement unspecified: Secondary | ICD-10-CM | POA: Diagnosis not present

## 2018-07-22 DIAGNOSIS — Z7952 Long term (current) use of systemic steroids: Secondary | ICD-10-CM | POA: Diagnosis not present

## 2018-07-22 DIAGNOSIS — M818 Other osteoporosis without current pathological fracture: Secondary | ICD-10-CM | POA: Diagnosis not present

## 2018-07-22 DIAGNOSIS — Z79899 Other long term (current) drug therapy: Secondary | ICD-10-CM | POA: Diagnosis not present

## 2018-07-22 DIAGNOSIS — Z888 Allergy status to other drugs, medicaments and biological substances status: Secondary | ICD-10-CM | POA: Diagnosis not present

## 2018-07-22 DIAGNOSIS — M3320 Polymyositis, organ involvement unspecified: Secondary | ICD-10-CM | POA: Diagnosis not present

## 2018-07-24 ENCOUNTER — Other Ambulatory Visit: Payer: Self-pay

## 2018-07-24 ENCOUNTER — Ambulatory Visit (INDEPENDENT_AMBULATORY_CARE_PROVIDER_SITE_OTHER): Payer: Medicare Other | Admitting: Family Medicine

## 2018-07-24 ENCOUNTER — Ambulatory Visit: Payer: Self-pay

## 2018-07-24 ENCOUNTER — Ambulatory Visit (INDEPENDENT_AMBULATORY_CARE_PROVIDER_SITE_OTHER): Payer: Medicare Other

## 2018-07-24 ENCOUNTER — Encounter: Payer: Self-pay | Admitting: Family Medicine

## 2018-07-24 VITALS — BP 115/77 | HR 65 | Temp 98.1°F | Resp 18 | Ht 68.5 in | Wt 183.8 lb

## 2018-07-24 DIAGNOSIS — S6992XA Unspecified injury of left wrist, hand and finger(s), initial encounter: Secondary | ICD-10-CM | POA: Diagnosis not present

## 2018-07-24 DIAGNOSIS — M79645 Pain in left finger(s): Secondary | ICD-10-CM

## 2018-07-24 DIAGNOSIS — M7989 Other specified soft tissue disorders: Secondary | ICD-10-CM | POA: Diagnosis not present

## 2018-07-24 DIAGNOSIS — S60042A Contusion of left ring finger without damage to nail, initial encounter: Secondary | ICD-10-CM

## 2018-07-24 NOTE — Patient Instructions (Addendum)
Keep gently working the finger to keep it limber.  Apply ice to the finger about every 3 or 4 hours through the day for a few days  Do not wear rings until you are confident that there is no more persisting swelling.  Take ibuprofen 200 mg 3 pills 3 times daily if needed for pain and inflammation.  Return if needed  X-ray report: IMPRESSION: Soft tissue swelling proximally. No fracture or dislocation. No appreciable arthropathic change.   If you have lab work done today you will be contacted with your lab results within the next 2 weeks.  If you have not heard from us then please contact us. The fastest way to get your results is to register for My Chart.   IF you received an x-ray today, you will receive an invoice from [facility]. Please contact [facility] at [phone] with questions or concerns regarding your invoice.   IF you received labwork today, you will receive an invoice from [facility]. Please contact [facility] at [phone] with questions or concerns regarding your invoice.   Our billing staff will not be able to assist you with questions regarding bills from these companies.  You will be contacted with the lab results as soon as they are available. The fastest way to get your results is to activate your My Chart account. Instructions are located on the last page of this paperwork. If you have not heard from us regarding the results in 2 weeks, please contact this office.

## 2018-07-24 NOTE — Telephone Encounter (Signed)
Incoming call from Patient .  Patient complains of possible broken left ring  finger.   Patient states that finger got caught up in dog cage.  This happened on yesterday.  Nail bed intact. Not able to put wedding ring on.  Patient states that it appears to be swollen  From base of the finger to the knuckle of the finger.  States that she can bend her fingers and open the left hand.   Last nite pain was rated 8 on a scale of 1 to 10.  Today pain is rated 5 on a scale of 1 to 10. Denies breaks in the skin.  States recently had tetanus booster.  Denies any other symptoms.  Scheduled appointment for today, 07/24/18 @1120 . Requested patient to arrive at 1105 am.  Provided care advice voiced understanding. Reason for Disposition . Large swelling or bruise  Answer Assessment - Initial Assessment Questions 1. MECHANISM: "How did the injury happen?"      Finger got stuck in the cage 2. ONSET: "When did the injury happen?" (Minutes or hours ago)      Yesterday around one 3. LOCATION: "What part of the finger is injured?" "Is the nail damaged?"      Ring left hand 4. APPEARANCE of the INJURY: "What does the injury look like?"      Swollen from finger to nuckle to bottom 5. SEVERITY: "Can you use the hand normally?"  "Can you bend your fingers into a ball and then fully open them?"     yes 6. SIZE: For cuts, bruises, or swelling, ask: "How large is it?" (e.g., inches or centimeters;  entire finger)      swollen 7. PAIN: "Is there pain?" If so, ask: "How bad is the pain?"    (e.g., Scale 1-10; or mild, moderate, severe)     Last nite 8 now 5 8. TETANUS: For any breaks in the skin, ask: "When was the last tetanus booster?"     Just had a tetnus9. OTHER SYMPTOMS: "Do you have any other symptoms?"     no 10. PREGNANCY: "Is there any chance you are pregnant?" "When was your last menstrual period?"       *No Answer*  Protocols used: FINGER INJURY-A-AH

## 2018-07-24 NOTE — Progress Notes (Signed)
Patient ID: [name], female    DOB: 05/16/1976  Age: 42 y.o. MRN: [id number]  Chief Complaint  Patient presents with  . Hand Pain    ring finger left hand thinks she might have dislocated it x1day     Subjective:   Patient was putting her dog back in a cage yesterday and the dog was pulling one way and she was holding onto the cage the other way and she got her left ring finger trapped and pulled on the ring.  Today the anger is swollen.  Current allergies, medications, problem list, past/family and social histories reviewed.  Objective:  BP 115/77   Pulse 65   Temp 98.1 F (36.7 C) (Oral)   Resp 18   Ht 5' 8.5" (1.74 m)   Wt 183 lb 12.8 oz (83.4 kg)   LMP 07/03/2018   SpO2 100%   BMI 27.54 kg/m   No acute distress.  Swelling of the proximal phalanx area of the left ring finger into the PIP joint.  Tender to touch.  Good flexion and extension.  X-ray: IMPRESSION: Soft tissue swelling proximally. No fracture or dislocation. No appreciable arthropathic change. Assessment & Plan:   Assessment: 1. Finger pain, left   2. Contusion of left ring finger without damage to nail, initial encounter       Plan: See instructions  Orders Placed This Encounter  Procedures  . DG Finger Ring Left    Standing Status:   Future    Number of Occurrences:   1    Standing Expiration Date:   07/24/2019    Order Specific Question:   Reason for Exam (SYMPTOM  OR DIAGNOSIS REQUIRED)    Answer:   jam    Order Specific Question:   Is the patient pregnant?    Answer:   No    Order Specific Question:   Preferred imaging location?    Answer:   External    No orders of the defined types were placed in this encounter.        Patient Instructions   Keep gently working the finger to keep it limber.  Apply ice to the finger about every 3 or 4 hours through the day for a few days  Do not wear rings until you are confident that there is no more persisting swelling.  Take  ibuprofen 200 mg 3 pills 3 times daily if needed for pain and inflammation.  Return if needed  X-ray report: IMPRESSION: Soft tissue swelling proximally. No fracture or dislocation. No appreciable arthropathic change.   If you have lab work done today you will be contacted with your lab results within the next 2 weeks.  If you have not heard from us then please contact us. The fastest way to get your results is to register for My Chart.   IF you received an x-ray today, you will receive an invoice from [facility]. Please contact [facility] at [phone] with questions or concerns regarding your invoice.   IF you received labwork today, you will receive an invoice from [facility]. Please contact [facility] at [phone] with questions or concerns regarding your invoice.   Our billing staff will not be able to assist you with questions regarding bills from these companies.  You will be contacted with the lab results as soon as they are available. The fastest way to get your results is to activate your My Chart account. Instructions are located on the last page of this paperwork. If  you have not heard from [place] regarding the results in 2 weeks, please contact this office.        No follow-ups on file.   Janace Hoard, MD 07/24/2018

## 2018-08-01 DIAGNOSIS — M3320 Polymyositis, organ involvement unspecified: Secondary | ICD-10-CM | POA: Diagnosis not present

## 2018-08-13 DIAGNOSIS — M3320 Polymyositis, organ involvement unspecified: Secondary | ICD-10-CM | POA: Diagnosis not present

## 2018-08-16 ENCOUNTER — Ambulatory Visit: Payer: Medicare Other | Admitting: Emergency Medicine

## 2018-08-29 DIAGNOSIS — S60042A Contusion of left ring finger without damage to nail, initial encounter: Secondary | ICD-10-CM | POA: Diagnosis not present

## 2018-08-29 DIAGNOSIS — S6000XA Contusion of unspecified finger without damage to nail, initial encounter: Secondary | ICD-10-CM | POA: Insufficient documentation

## 2018-09-01 DIAGNOSIS — H5213 Myopia, bilateral: Secondary | ICD-10-CM | POA: Diagnosis not present

## 2018-10-31 DIAGNOSIS — M3320 Polymyositis, organ involvement unspecified: Secondary | ICD-10-CM | POA: Diagnosis not present

## 2018-10-31 DIAGNOSIS — G2581 Restless legs syndrome: Secondary | ICD-10-CM | POA: Diagnosis not present

## 2018-10-31 DIAGNOSIS — Z9889 Other specified postprocedural states: Secondary | ICD-10-CM | POA: Diagnosis not present

## 2018-11-04 DIAGNOSIS — M818 Other osteoporosis without current pathological fracture: Secondary | ICD-10-CM | POA: Diagnosis not present

## 2018-11-04 DIAGNOSIS — R531 Weakness: Secondary | ICD-10-CM | POA: Diagnosis not present

## 2018-11-04 DIAGNOSIS — M3320 Polymyositis, organ involvement unspecified: Secondary | ICD-10-CM | POA: Diagnosis not present

## 2018-11-04 DIAGNOSIS — Z23 Encounter for immunization: Secondary | ICD-10-CM | POA: Diagnosis not present

## 2018-11-04 DIAGNOSIS — Z79899 Other long term (current) drug therapy: Secondary | ICD-10-CM | POA: Diagnosis not present

## 2018-11-07 ENCOUNTER — Encounter: Payer: Self-pay | Admitting: Family Medicine

## 2018-11-07 ENCOUNTER — Other Ambulatory Visit: Payer: Self-pay

## 2018-11-07 ENCOUNTER — Ambulatory Visit (INDEPENDENT_AMBULATORY_CARE_PROVIDER_SITE_OTHER): Payer: Medicare Other | Admitting: Family Medicine

## 2018-11-07 VITALS — BP 121/83 | HR 77 | Temp 97.8°F | Resp 16 | Ht 69.0 in | Wt 185.6 lb

## 2018-11-07 DIAGNOSIS — E7800 Pure hypercholesterolemia, unspecified: Secondary | ICD-10-CM | POA: Diagnosis not present

## 2018-11-07 DIAGNOSIS — Z7952 Long term (current) use of systemic steroids: Secondary | ICD-10-CM | POA: Diagnosis not present

## 2018-11-07 DIAGNOSIS — G2581 Restless legs syndrome: Secondary | ICD-10-CM | POA: Insufficient documentation

## 2018-11-07 DIAGNOSIS — M3320 Polymyositis, organ involvement unspecified: Secondary | ICD-10-CM

## 2018-11-07 NOTE — Patient Instructions (Signed)
° ° ° °  If you have lab work done today you will be contacted with your lab results within the next 2 weeks.  If you have not heard from us then please contact us. The fastest way to get your results is to register for My Chart. ° ° °IF you received an x-ray today, you will receive an invoice from [facility]. Please contact [facility] at [phone] with questions or concerns regarding your invoice.  ° °IF you received labwork today, you will receive an invoice from [facility]. Please contact [facility] at [phone] with questions or concerns regarding your invoice.  ° °Our billing staff will not be able to assist you with questions regarding bills from these companies. ° °You will be contacted with the lab results as soon as they are available. The fastest way to get your results is to activate your My Chart account. Instructions are located on the last page of this paperwork. If you have not heard from us regarding the results in 2 weeks, please contact this office. °  ° ° ° °

## 2018-11-07 NOTE — Progress Notes (Signed)
3/12/20209:50 PM  [name] 23-Oct-1975, 43 y.o. female [id number]  Chief Complaint  Patient presents with  . Depression    with Anxiety ( 6 month follow-up)  . Hypertension    follow-up     HPI:   Patient is a 43 y.o. female with past medical history significant for HTN, HLP, polymyositis, ACM, vitamin d deficiency, osteoporosis, RLS who presents today for routine followup  Last OV sept 2019 Chart review: Neuro stopped zetia due to increase in CK Last saw neuro and rheum a week ago Off mthx, on pred 30mg  qday and rituxan, considering azathioprine Was referred to PT Considering increasing alendronate to 70mg  weekly Started mirapex 0.125mg , can titrate to 3 tabs  Patient reports that she has been doing well since our last visit in sept She got married in October, very happy She overall feels fine/same Has started to drive again, uses a cane only when outside the house for support, no recent falls, applying for jobs, going to the gym Trying to eat healthier Has not had too much weight gain since on pred late last year Has no acute concerns today Requesting refill of motrin, uses very prn  Fall Risk  11/07/2018 07/24/2018 05/08/2018 12/14/2017 11/01/2017  Falls in the past year? 0 1 No No No  Number falls in past yr: - 1 - - -  Injury with Fall? 0 0 - - -     Depression screen [facility] 2/9 11/07/2018 11/07/2018 11/07/2018  Decreased Interest 0 0 0  Down, Depressed, Hopeless 0 0 0  PHQ - 2 Score 0 0 0  Altered sleeping 1 - -  Tired, decreased energy 1 - -  Change in appetite 0 - -  Feeling bad or failure about yourself  0 - -  Trouble concentrating 0 - -  Moving slowly or fidgety/restless 0 - -  Suicidal thoughts 0 - -  PHQ-9 Score 2 - -    Allergies  Allergen Reactions  . Buspirone Nausea Only  . Paxil  [Paroxetine Hcl] Nausea Only    Prior to Admission medications   Medication Sig Start Date End Date Taking? Authorizing Provider  alendronate (FOSAMAX) 35 MG  tablet Take by mouth. 08/08/18  Yes [provider]  Cholecalciferol (VITAMIN D) 2000 units tablet Take 2 tablets (4,000 Units total) by mouth daily. 01/03/18  Yes Myles Lipps, MD  ezetimibe (ZETIA) 10 MG tablet TAKE 1 TABLET BY MOUTH EVERY DAY 03/01/18  Yes Myles Lipps, MD  HYDROcodone-acetaminophen (NORCO) 7.5-325 MG tablet Take 1 tablet by mouth 2 (two) times daily as needed for moderate pain.   Yes [provider]  ibuprofen (ADVIL,MOTRIN) 600 MG tablet Take 1 tablet (600 mg total) by mouth every 8 (eight) hours as needed. 12/14/17  Yes Myles Lipps, MD  Multiple Vitamins-Minerals (ONE-A-DAY VITACRAVES ADULT) CHEW Chew 1 each by mouth daily.   Yes [provider]  Olopatadine HCl 0.2 % SOLN Apply 1 drop to eye daily. 10/01/17  Yes Stallings, Zoe A, MD  omeprazole (PRILOSEC) 40 MG capsule Take 40 mg by mouth daily.   Yes [provider]  pramipexole (MIRAPEX) 0.125 MG tablet Take by mouth. 11/01/18  Yes [provider]  predniSONE (DELTASONE) 20 MG tablet Take 60mg  x 3 days, then take 40mg  x 3 days, then start back on your current 15mg  regimen 03/11/18  Yes McVey, Madelaine Bhat, PA-C  triamcinolone cream (KENALOG) 0.1 % Apply 1 application topically 2 (two) times daily. 01/02/18  Yes Myles Lipps, MD  valACYclovir (VALTREX) 500 MG tablet Take 1 tablet (500 mg total) by mouth 2 (two) times daily. for 3 days at onset of symptoms related with an outbreak 07/20/17  Yes Myles Lipps, MD  Vitamin D, Ergocalciferol, (DRISDOL) 50000 units CAPS capsule Take 1 capsule (50,000 Units total) by mouth every 7 (seven) days. 05/13/18  Yes Myles Lipps, MD    Past Medical History:  Diagnosis Date  . Anxiety   . Hyperlipidemia   . Hypertension   . Polymyositis (HCC) 2009  . Vitamin D deficiency     Past Surgical History:  Procedure Laterality Date  . BRAIN SURGERY      Social History   Tobacco Use  . Smoking status: Never Smoker  .  Smokeless tobacco: Never Used  Substance Use Topics  . Alcohol use: No    Family History  Problem Relation Age of Onset  . Hepatitis Mother   . Diabetes Paternal Uncle   . Diabetes Maternal Grandmother   . Heart disease Paternal Grandfather   . Breast cancer Maternal Aunt        ? age of onset    Review of Systems  Constitutional: Negative for chills and fever.  Respiratory: Negative for cough and shortness of breath.   Cardiovascular: Negative for chest pain, palpitations and leg swelling.  Gastrointestinal: Negative for abdominal pain, nausea and vomiting.  per hpi   OBJECTIVE:  Blood pressure 121/83, pulse 77, temperature 97.8 F (36.6 C), temperature source Oral, resp. rate 16, height 5\' 9"  (1.753 m), weight 185 lb 9.6 oz (84.2 kg), SpO2 98 %. Body mass index is 27.41 kg/m.   Physical Exam Vitals signs and nursing note reviewed.  Constitutional:      Appearance: She is well-developed.  HENT:     Head: Normocephalic and atraumatic.     Mouth/Throat:     Pharynx: No oropharyngeal exudate.  Eyes:     General: No scleral icterus.    Conjunctiva/sclera: Conjunctivae normal.     Pupils: Pupils are equal, round, and reactive to light.  Neck:     Musculoskeletal: Neck supple.  Cardiovascular:     Rate and Rhythm: Normal rate and regular rhythm.     Heart sounds: Normal heart sounds. No murmur. No friction rub. No gallop.   Pulmonary:     Effort: Pulmonary effort is normal.     Breath sounds: Normal breath sounds. No wheezing or rales.  Skin:    General: Skin is warm and dry.  Neurological:     Mental Status: She is alert and oriented to person, place, and time.     Gait: Gait abnormal.     ASSESSMENT and PLAN  1. HYPERCHOLESTEROLEMIA Unable to do statins nor zetia due to raise in CK. She has been working on [employer]. Checking labs today - CBC with Differential/Platelet; Future - Lipid panel; Future - TSH; Future  2. Polymyositis (HCC) 3. Long term systemic  steroid user Under neuro and rheum care. Stable. They are also managing her osteoporosis. Checking a1c. Cont with diet and exercise.  - Hemoglobin A1c; Future - Comprehensive metabolic panel; Future   Return in about 6 months (around 05/10/2019).    Myles Lipps, MD Primary Care at [facility] [address] [place], [place] [address] Ph.  [phone] Fax [phone]

## 2018-11-13 ENCOUNTER — Other Ambulatory Visit: Payer: Self-pay

## 2018-11-13 ENCOUNTER — Ambulatory Visit (INDEPENDENT_AMBULATORY_CARE_PROVIDER_SITE_OTHER): Payer: Medicare Other | Admitting: Podiatry

## 2018-11-13 ENCOUNTER — Encounter: Payer: Self-pay | Admitting: Podiatry

## 2018-11-13 VITALS — BP 137/90

## 2018-11-13 DIAGNOSIS — L600 Ingrowing nail: Secondary | ICD-10-CM | POA: Diagnosis not present

## 2018-11-13 DIAGNOSIS — B353 Tinea pedis: Secondary | ICD-10-CM | POA: Diagnosis not present

## 2018-11-13 MED ORDER — TERBINAFINE HCL 250 MG PO TABS: 250.0000 mg | ORAL_TABLET | Freq: Every day | ORAL | 0 refills | Status: DC

## 2018-11-13 MED ORDER — CLOTRIMAZOLE-BETAMETHASONE 1-0.05 % EX CREA: 1.0000 "application " | TOPICAL_CREAM | Freq: Two times a day (BID) | CUTANEOUS | 1 refills | Status: DC

## 2018-11-13 MED ORDER — GENTAMICIN SULFATE 0.1 % EX CREA: 1.0000 "application " | TOPICAL_CREAM | Freq: Two times a day (BID) | CUTANEOUS | 0 refills | Status: DC

## 2018-11-13 NOTE — Patient Instructions (Signed)

## 2018-11-15 DIAGNOSIS — H04123 Dry eye syndrome of bilateral lacrimal glands: Secondary | ICD-10-CM | POA: Diagnosis not present

## 2018-11-15 DIAGNOSIS — H40033 Anatomical narrow angle, bilateral: Secondary | ICD-10-CM | POA: Diagnosis not present

## 2018-11-19 NOTE — Progress Notes (Signed)
   Subjective: Patient presents today for evaluation of intermittent pain to the medial border of the left hallux that began 2-3 months ago. Patient is concerned for possible ingrown nail. Wearing shoes makes the pain worse. She has been clipping the nail to help alleviate the pain. Patient presents today for further treatment and evaluation.  Past Medical History:  Diagnosis Date  . Anxiety   . Hyperlipidemia   . HYPERTENSION, BENIGN ESSENTIAL 06/16/2008   Qualifier: Diagnosis of  By: Daphine Deutscher FNP, Zena Amos    . Polymyositis (HCC) 2009  . RLS (restless legs syndrome)   . Vitamin D deficiency     Objective:  General: Well developed, nourished, in no acute distress, alert and oriented x3   Dermatology: Skin is warm, dry and supple bilateral. Medial border of the left hallux appears to be erythematous with evidence of an ingrowing nail. Pain on palpation noted to the border of the nail fold. Pruritus noted to the left foot with hyperkeratosis. There are no open sores, lesions.  Vascular: Dorsalis Pedis artery and Posterior Tibial artery pedal pulses palpable. No lower extremity edema noted.   Neruologic: Grossly intact via light touch bilateral.  Musculoskeletal: Muscular strength within normal limits in all groups bilateral. Normal range of motion noted to all pedal and ankle joints.   Assesement: #1 Paronychia with ingrowing nail medial border left hallux  #2 Pain in toe #3 Incurvated nail #4 tinea pedis left   Plan of Care:  1. Patient evaluated.  2. Discussed treatment alternatives and plan of care. Explained nail avulsion procedure and post procedure course to patient. 3. Patient opted for permanent partial nail avulsion of the medial border of the left hallux.  4. Prior to procedure, local anesthesia infiltration utilized using 3 ml of a 50:50 mixture of 2% plain lidocaine and 0.5% plain marcaine in a normal hallux block fashion and a betadine prep performed.  5. Partial  permanent nail avulsion with chemical matrixectomy performed using 3x30sec applications of phenol followed by alcohol flush.  6. Light dressing applied. 7. Prescription for Gentamicin cream provided to patient to use daily with a bandage.  8. Prescription for Lamisil 250 mg #28 provided to patient.  9. Prescription for Lotrisone cream provided to patient.  10. Return to clinic in 2 weeks.   Felecia Shelling, DPM [facility]  Dr. Felecia Shelling, DPM    [address]                                        [place], [place] [address]                Office [phone]  Fax [phone]

## 2018-11-27 ENCOUNTER — Ambulatory Visit (INDEPENDENT_AMBULATORY_CARE_PROVIDER_SITE_OTHER): Payer: Medicare Other | Admitting: Podiatry

## 2018-11-27 ENCOUNTER — Encounter: Payer: Self-pay | Admitting: Podiatry

## 2018-11-27 ENCOUNTER — Other Ambulatory Visit: Payer: Self-pay

## 2018-11-27 VITALS — Temp 97.7°F

## 2018-11-27 DIAGNOSIS — B353 Tinea pedis: Secondary | ICD-10-CM | POA: Diagnosis not present

## 2018-11-27 DIAGNOSIS — L600 Ingrowing nail: Secondary | ICD-10-CM

## 2018-11-27 NOTE — Progress Notes (Signed)
   Subjective: 43 y.o. female presents today status post permanent nail avulsion procedure of the medial border left great toe that was performed on 11/13/2018.  Patient denies pain.  She says there is no longer any drainage.  She notices significant improvement since doing the procedure. Patient was also prescribed 4 weeks of oral Lamisil for tinea pedis to the left foot.  She has not taken the medication yet because she recently was placed on a medication that is filtered through the liver.  She has 2 weeks left of this medication and then she will begin taking the Lamisil.  She has been applying Lotrisone cream with significant improvement  Past Medical History:  Diagnosis Date  . Anxiety   . Hyperlipidemia   . HYPERTENSION, BENIGN ESSENTIAL 06/16/2008   Qualifier: Diagnosis of  By: Daphine Deutscher FNP, Zena Amos    . Polymyositis (HCC) 2009  . RLS (restless legs syndrome)   . Vitamin D deficiency     Objective: Skin is warm, dry and supple. Nail and respective nail fold appears to be healing appropriately. Open wound to the associated nail fold with a granular wound base and moderate amount of fibrotic tissue. Minimal drainage noted. Mild erythema around the periungual region likely due to phenol chemical matricectomy.  Tinea pedis appears to be improved with less skin peeling along the foot and less pruritus.  Assessment: #1 postop permanent partial nail avulsion medial border left great toe #2 open wound periungual nail fold of respective digit.  #3 tinea pedis left  Plan of care: #1 patient was evaluated  #2 debridement of open wound was performed to the periungual border of the respective toe using a currette. Antibiotic ointment and Band-Aid was applied. #3  Continue Lotrisone cream  #4 patient may begin taking 4 weeks of Lamisil after completing her medication that she is currently on  #5 patient is to return to clinic on a PRN basis.   Felecia Shelling, DPM [facility]   Dr. Felecia Shelling, DPM    [address]                                        [place], [place] [address]                Office [phone]  Fax [phone]

## 2018-12-02 ENCOUNTER — Telehealth: Payer: Self-pay | Admitting: Registered Nurse

## 2018-12-02 NOTE — Telephone Encounter (Signed)
Called pt to discuss AWV. Pt states that they do not wish to have this visit at this time and would prefer to call back to schedule within the next few months.

## 2019-02-06 DIAGNOSIS — M3320 Polymyositis, organ involvement unspecified: Secondary | ICD-10-CM | POA: Diagnosis not present

## 2019-02-06 DIAGNOSIS — Z79899 Other long term (current) drug therapy: Secondary | ICD-10-CM | POA: Diagnosis not present

## 2019-02-07 DIAGNOSIS — Z79899 Other long term (current) drug therapy: Secondary | ICD-10-CM | POA: Diagnosis not present

## 2019-02-07 DIAGNOSIS — M3320 Polymyositis, organ involvement unspecified: Secondary | ICD-10-CM | POA: Diagnosis not present

## 2019-02-27 ENCOUNTER — Ambulatory Visit: Payer: Medicare Other | Admitting: Family Medicine

## 2019-02-27 DIAGNOSIS — M25562 Pain in left knee: Secondary | ICD-10-CM | POA: Diagnosis not present

## 2019-02-27 DIAGNOSIS — M170 Bilateral primary osteoarthritis of knee: Secondary | ICD-10-CM | POA: Diagnosis not present

## 2019-02-27 DIAGNOSIS — M25561 Pain in right knee: Secondary | ICD-10-CM | POA: Diagnosis not present

## 2019-03-06 DIAGNOSIS — M1711 Unilateral primary osteoarthritis, right knee: Secondary | ICD-10-CM | POA: Diagnosis not present

## 2019-03-06 DIAGNOSIS — M25561 Pain in right knee: Secondary | ICD-10-CM | POA: Diagnosis not present

## 2019-03-13 DIAGNOSIS — M1711 Unilateral primary osteoarthritis, right knee: Secondary | ICD-10-CM | POA: Diagnosis not present

## 2019-03-13 DIAGNOSIS — M25561 Pain in right knee: Secondary | ICD-10-CM | POA: Diagnosis not present

## 2019-03-19 DIAGNOSIS — M25561 Pain in right knee: Secondary | ICD-10-CM | POA: Diagnosis not present

## 2019-03-19 DIAGNOSIS — M1711 Unilateral primary osteoarthritis, right knee: Secondary | ICD-10-CM | POA: Diagnosis not present

## 2019-03-25 DIAGNOSIS — M3320 Polymyositis, organ involvement unspecified: Secondary | ICD-10-CM | POA: Diagnosis not present

## 2019-03-26 ENCOUNTER — Ambulatory Visit: Payer: Medicare Other

## 2019-04-10 DIAGNOSIS — M25561 Pain in right knee: Secondary | ICD-10-CM | POA: Diagnosis not present

## 2019-04-18 DIAGNOSIS — M25561 Pain in right knee: Secondary | ICD-10-CM | POA: Diagnosis not present

## 2019-04-23 ENCOUNTER — Ambulatory Visit: Payer: Self-pay

## 2019-04-23 ENCOUNTER — Other Ambulatory Visit: Payer: Self-pay | Admitting: Family Medicine

## 2019-04-23 NOTE — Telephone Encounter (Signed)
Courtesy refill until appointment  

## 2019-04-29 NOTE — Progress Notes (Unsigned)
Appointment cancelled

## 2019-05-07 DIAGNOSIS — M25561 Pain in right knee: Secondary | ICD-10-CM | POA: Diagnosis not present

## 2019-05-26 DIAGNOSIS — M25561 Pain in right knee: Secondary | ICD-10-CM | POA: Diagnosis not present

## 2019-06-23 ENCOUNTER — Other Ambulatory Visit: Payer: Self-pay | Admitting: Family Medicine

## 2019-06-23 NOTE — Telephone Encounter (Signed)
Medication Refill - Medication: valACYclovir (VALTREX) 500 MG tablet   Has the patient contacted their pharmacy? Yes.   (Agent: If no, request that the patient contact the pharmacy for the refill.) (Agent: If yes, when and what did the pharmacy advise?)  Preferred Pharmacy (with phone number or street name): [id number] - [place], [place].  Agent: Please be advised that RX refills may take up to 3 business days. We ask that you follow-up with your pharmacy.

## 2019-06-23 NOTE — Telephone Encounter (Signed)
Requested medication (s) are due for refill today: yes  Requested medication (s) are on the active medication list: yes   Future visit scheduled: no  Notes to clinic:  Overdue for follow up visit    Requested Prescriptions  Pending Prescriptions Disp Refills   valACYclovir (VALTREX) 500 MG tablet 30 tablet 2    Sig: Take 1 tablet (500 mg total) by mouth 2 (two) times daily. for 3 days at onset of symptoms related with an outbreak     Antimicrobials:  Antiviral Agents - Anti-Herpetic Passed - 06/23/2019  1:07 PM      Passed - Valid encounter within last 12 months    Recent Outpatient Visits          7 months ago HYPERCHOLESTEROLEMIA   [facility], Lilia Argue, MD   11 months ago Finger pain, left   Primary Care at [facility], Fenton Malling, MD   1 year ago Polymyositis [facility])   [facility], Lilia Argue, MD   1 year ago Numbness and tingling of right arm   Primary Care at [facility], Gelene Mink, PA-C   1 year ago POLYMYOSITIS   [facility], Lilia Argue, MD

## 2019-06-24 MED ORDER — VALACYCLOVIR HCL 500 MG PO TABS: 500.0000 mg | ORAL_TABLET | Freq: Two times a day (BID) | ORAL | 2 refills | Status: DC

## 2019-09-08 DIAGNOSIS — Z79899 Other long term (current) drug therapy: Secondary | ICD-10-CM | POA: Diagnosis not present

## 2019-09-08 DIAGNOSIS — M3320 Polymyositis, organ involvement unspecified: Secondary | ICD-10-CM | POA: Diagnosis not present

## 2019-09-24 ENCOUNTER — Telehealth: Payer: Self-pay | Admitting: *Deleted

## 2019-09-24 NOTE — Telephone Encounter (Signed)
Schedule awv  

## 2019-10-08 ENCOUNTER — Other Ambulatory Visit: Payer: Self-pay | Admitting: Family Medicine

## 2019-10-08 NOTE — Telephone Encounter (Signed)
Requested medication (s) are due for refill today -yes  Requested medication (s) are on the active medication list -yes  Future visit scheduled -no  Last refill: -04/23/19  Notes to clinic: Request for refill- labs do not met protocol. Sent for review  Requested Prescriptions  Pending Prescriptions Disp Refills   ibuprofen (ADVIL) 600 MG tablet [Pharmacy Med Name: IBUPROFEN 600 MG TABLET] 30 tablet 0    Sig: TAKE 1 TABLET BY MOUTH EVERY 8 HOURS      Analgesics:  NSAIDS Failed - 10/08/2019  1:51 PM      Failed - Cr in normal range and within 360 days    Creatinine, Ser  Date Value Ref Range Status  01/02/2018 0.49 (L) 0.57 - 1.00 mg/dL Final          Failed - HGB in normal range and within 360 days    Hemoglobin  Date Value Ref Range Status  07/20/2017 15.1 11.1 - 15.9 g/dL Final          Passed - Patient is not pregnant      Passed - Valid encounter within last 12 months    Recent Outpatient Visits           11 months ago HYPERCHOLESTEROLEMIA   [facility], Lilia Argue, MD   1 year ago Finger pain, left   Primary Care at [facility], Fenton Malling, MD   1 year ago Polymyositis [facility])   [facility], Lilia Argue, MD   1 year ago Numbness and tingling of right arm   [facility] Care at [facility], Gelene Mink, PA-C   1 year ago POLYMYOSITIS   [facility], Lilia Argue, MD                  Requested Prescriptions  Pending Prescriptions Disp Refills   ibuprofen (ADVIL) 600 MG tablet [Pharmacy Med Name: IBUPROFEN 600 MG TABLET] 30 tablet 0    Sig: TAKE 1 TABLET BY MOUTH EVERY 8 HOURS      Analgesics:  NSAIDS Failed - 10/08/2019  1:51 PM      Failed - Cr in normal range and within 360 days    Creatinine, Ser  Date Value Ref Range Status  01/02/2018 0.49 (L) 0.57 - 1.00 mg/dL Final          Failed - HGB in normal range and within 360 days    Hemoglobin  Date Value Ref Range Status  07/20/2017 15.1 11.1 - 15.9  g/dL Final          Passed - Patient is not pregnant      Passed - Valid encounter within last 12 months    Recent Outpatient Visits           11 months ago HYPERCHOLESTEROLEMIA   [facility], Lilia Argue, MD   1 year ago Finger pain, left   [facility] at [facility], Fenton Malling, MD   1 year ago Polymyositis [facility])   [facility], Lilia Argue, MD   1 year ago Numbness and tingling of right arm   [facility] Care at [facility], Gelene Mink, PA-C   1 year ago POLYMYOSITIS   [facility], Lilia Argue, MD

## 2019-10-21 ENCOUNTER — Other Ambulatory Visit: Payer: Self-pay | Admitting: Family Medicine

## 2019-10-21 DIAGNOSIS — Z1231 Encounter for screening mammogram for malignant neoplasm of breast: Secondary | ICD-10-CM

## 2019-10-24 ENCOUNTER — Other Ambulatory Visit: Payer: Self-pay

## 2019-10-24 ENCOUNTER — Ambulatory Visit
Admission: RE | Admit: 2019-10-24 | Discharge: 2019-10-24 | Disposition: A | Discharge status: Home / Self Care | Payer: Medicaid Other | Source: Ambulatory Visit | Attending: Family Medicine | Admitting: Family Medicine

## 2019-10-24 DIAGNOSIS — Z1231 Encounter for screening mammogram for malignant neoplasm of breast: Secondary | ICD-10-CM | POA: Diagnosis not present

## 2019-10-31 ENCOUNTER — Ambulatory Visit (INDEPENDENT_AMBULATORY_CARE_PROVIDER_SITE_OTHER): Payer: Medicare Other | Admitting: Family Medicine

## 2019-10-31 ENCOUNTER — Other Ambulatory Visit: Payer: Self-pay

## 2019-10-31 ENCOUNTER — Encounter: Payer: Self-pay | Admitting: Family Medicine

## 2019-10-31 VITALS — BP 148/77 | HR 71 | Temp 97.6°F | Resp 18 | Ht 69.0 in | Wt 188.0 lb

## 2019-10-31 DIAGNOSIS — M3320 Polymyositis, organ involvement unspecified: Secondary | ICD-10-CM

## 2019-10-31 DIAGNOSIS — B009 Herpesviral infection, unspecified: Secondary | ICD-10-CM

## 2019-10-31 DIAGNOSIS — M816 Localized osteoporosis [Lequesne]: Secondary | ICD-10-CM

## 2019-10-31 DIAGNOSIS — Z7952 Long term (current) use of systemic steroids: Secondary | ICD-10-CM

## 2019-10-31 DIAGNOSIS — E7800 Pure hypercholesterolemia, unspecified: Secondary | ICD-10-CM

## 2019-10-31 DIAGNOSIS — E559 Vitamin D deficiency, unspecified: Secondary | ICD-10-CM

## 2019-10-31 DIAGNOSIS — Z0001 Encounter for general adult medical examination with abnormal findings: Secondary | ICD-10-CM

## 2019-10-31 DIAGNOSIS — Z0000 Encounter for general adult medical examination without abnormal findings: Secondary | ICD-10-CM

## 2019-10-31 MED ORDER — VALACYCLOVIR HCL 500 MG PO TABS: 500.0000 mg | ORAL_TABLET | Freq: Every day | ORAL | 3 refills | Status: DC

## 2019-10-31 MED ORDER — PREDNISONE 10 MG PO TABS: 10.0000 mg | ORAL_TABLET | Freq: Every day | ORAL | Status: DC

## 2019-10-31 MED ORDER — IBUPROFEN 600 MG PO TABS: 600.0000 mg | ORAL_TABLET | Freq: Three times a day (TID) | ORAL | 3 refills | Status: DC

## 2019-10-31 NOTE — Patient Instructions (Addendum)
Preventive Care 56-42 Years Old, Female Preventive care refers to visits with your health care provider and lifestyle choices that can promote health and wellness. This includes:  A yearly physical exam. This may also be called an annual well check.  Regular dental visits and eye exams.  Immunizations.  Screening for certain conditions.  Healthy lifestyle choices, such as eating a healthy diet, getting regular exercise, not using drugs or products that contain nicotine and tobacco, and limiting alcohol use. What can I expect for my preventive care visit? Physical exam Your health care provider will check your:  Height and weight. This may be used to calculate body mass index (BMI), which tells if you are at a healthy weight.  Heart rate and blood pressure.  Skin for abnormal spots. Counseling Your health care provider may ask you questions about your:  Alcohol, tobacco, and drug use.  Emotional well-being.  Home and relationship well-being.  Sexual activity.  Eating habits.  Work and work Statistician.  Method of birth control.  Menstrual cycle.  Pregnancy history. What immunizations do I need?  Influenza (flu) vaccine  This is recommended every year. Tetanus, diphtheria, and pertussis (Tdap) vaccine  You may need a Td booster every 10 years. Varicella (chickenpox) vaccine  You may need this if you have not been vaccinated. Zoster (shingles) vaccine  You may need this after age 42. Measles, mumps, and rubella (MMR) vaccine  You may need at least one dose of MMR if you were born in 1957 or later. You may also need a second dose. Pneumococcal conjugate (PCV13) vaccine  You may need this if you have certain conditions and were not previously vaccinated. Pneumococcal polysaccharide (PPSV23) vaccine  You may need one or two doses if you smoke cigarettes or if you have certain conditions. Meningococcal conjugate (MenACWY) vaccine  You may need this if  you have certain conditions. Hepatitis A vaccine  You may need this if you have certain conditions or if you travel or work in places where you may be exposed to hepatitis A. Hepatitis B vaccine  You may need this if you have certain conditions or if you travel or work in places where you may be exposed to hepatitis B. Haemophilus influenzae type b (Hib) vaccine  You may need this if you have certain conditions. Human papillomavirus (HPV) vaccine  If recommended by your health care provider, you may need three doses over 6 months. You may receive vaccines as individual doses or as more than one vaccine together in one shot (combination vaccines). Talk with your health care provider about the risks and benefits of combination vaccines. What tests do I need? Blood tests  Lipid and cholesterol levels. These may be checked every 5 years, or more frequently if you are over 62 years old.  Hepatitis C test.  Hepatitis B test. Screening  Lung cancer screening. You may have this screening every year starting at age 42 if you have a 30-pack-year history of smoking and currently smoke or have quit within the past 15 years.  Colorectal cancer screening. All adults should have this screening starting at age 42 and continuing until age 97. Your health care provider may recommend screening at age 42 if you are at increased risk. You will have tests every 1-10 years, depending on your results and the type of screening test.  Diabetes screening. This is done by checking your blood sugar (glucose) after you have not eaten for a while (fasting). You may have  done every 1-3 years.  Mammogram. This may be done every 1-2 years. Talk with your health care provider about when you should start having regular mammograms. This may depend on whether you have a family history of breast cancer.  BRCA-related cancer screening. This may be done if you have a family history of breast, ovarian, tubal, or peritoneal  cancers.  Pelvic exam and Pap test. This may be done every 3 years starting at age 42. Starting at age 30, this may be done every 5 years if you have a Pap test in combination with an HPV test. Other tests  Sexually transmitted disease (STD) testing.  Bone density scan. This is done to screen for osteoporosis. You may have this scan if you are at high risk for osteoporosis. Follow these instructions at home: Eating and drinking  Eat a diet that includes fresh fruits and vegetables, whole grains, lean protein, and low-fat dairy.  Take vitamin and mineral supplements as recommended by your health care provider.  Do not drink alcohol if: ? Your health care provider tells you not to drink. ? You are pregnant, may be pregnant, or are planning to become pregnant.  If you drink alcohol: ? Limit how much you have to 0-1 drink a day. ? Be aware of how much alcohol is in your drink. In the [place] one drink equals one 12 oz bottle of beer (355 mL), one 5 oz glass of wine (148 mL), or one 1 oz glass of hard liquor (44 mL). Lifestyle  Take daily care of your teeth and gums.  Stay active. Exercise for at least 30 minutes on 5 or more days each week.  Do not use any products that contain nicotine or tobacco, such as cigarettes, e-cigarettes, and chewing tobacco. If you need help quitting, ask your health care provider.  If you are sexually active, practice safe sex. Use a condom or other form of birth control (contraception) in order to prevent pregnancy and STIs (sexually transmitted infections).  If told by your health care provider, take low-dose aspirin daily starting at age 42. What's next?  Visit your health care provider once a year for a well check visit.  Ask your health care provider how often you should have your eyes and teeth checked.  Stay up to date on all vaccines. This information is not intended to replace advice given to you by your health care provider. Make sure you  discuss any questions you have with your health care provider. Document Revised: 04/25/2018 Document Reviewed: 04/25/2018 Elsevier Patient Education  2020 [employer]     If you have lab work done today you will be contacted with your lab results within the next 2 weeks.  If you have not heard from us then please contact us. The fastest way to get your results is to register for My Chart.   IF you received an x-ray today, you will receive an invoice from [facility]. Please contact [facility] at [phone] with questions or concerns regarding your invoice.   IF you received labwork today, you will receive an invoice from [facility]. Please contact [facility] at [phone] with questions or concerns regarding your invoice.   Our billing staff will not be able to assist you with questions regarding bills from these companies.  You will be contacted with the lab results as soon as they are available. The fastest way to get your results is to activate your My Chart account. Instructions are located on the last   page of this paperwork. If you have not heard from [place] regarding the results in 2 weeks, please contact this office.

## 2019-10-31 NOTE — Progress Notes (Signed)
Presents today for [employer] Visit-Subsequent.   Date of last exam: March 2020 routine OV  Interpreter used for this visit? n/a  Patient Care Team: Myles Lipps, MD as PCP - General (Family Medicine) Caress, Fayrene Fearing, MD as Referring Physician (Psychiatry) Dr Durene Romans - ortho, right knee pain Dr Jackelyn Poling - rheum - not seeing anymore Dr Gala Lewandowsky - podiatry  Other items to address today: PMH: HTN, HLP, polymyositis, ACM, vitamin d deficiency, osteoporosis, RLS  Trial of prn valtrex - has had several outbreaks, specially when pred has to be increased past several months  Cancer Screening: Cervical: due, will schedule at another visit Breast: mammo done March 2021 Colon: due at age 67   Other Screening: Last screening for diabetes: due today Last lipid screening: h/o HLP, not on statin due to polymyositis Dexa: Jan 2019, neurology stopped alendronate, takes OTC D 1500 units a day  Fasting  Reports RLS has resolved completely  ADVANCE DIRECTIVES: Discussed: No  Immunization status:  Immunization History  Administered Date(s) Administered  . Influenza,inj,Quad PF,6+ Mos 11/04/2018  . Tdap 07/20/2017     Health Maintenance Due  Topic Date Due  . INFLUENZA VACCINE  03/29/2019  . PAP SMEAR-Modifier  10/27/2019     Functional Status Survey: Is the patient deaf or have difficulty hearing?: No Does the patient have difficulty seeing, even when wearing glasses/contacts?: Yes(if havent eaten or too long on the computer) Does the patient have difficulty concentrating, remembering, or making decisions?: No Does the patient have difficulty walking or climbing stairs?: Yes(uses cane to walk) Does the patient have difficulty dressing or bathing?: Yes(shower chair) Does the patient have difficulty doing errands alone such as visiting a doctor's office or shopping?: No   6CIT Screen 10/31/2019 04/23/2019 04/23/2019  What Year? 0 points 0  points 0 points  What month? 0 points 0 points 0 points  What time? 0 points 0 points 0 points  Count back from 20 0 points 0 points 0 points  Months in reverse 0 points 0 points 0 points  Repeat phrase 0 points 0 points -  Total Score 0 0 -      Home Environment: denies any safety or fall concerns, uses a cane  Urinary Incontinence Screening: denies any issues with incontinence  Patient Active Problem List   Diagnosis Date Noted  . RLS (restless legs syndrome) 11/07/2018  . Long term systemic steroid user 11/07/2018  . Vitamin D deficiency 08/03/2017  . Osteoporosis 01/30/2014  . HYPERCHOLESTEROLEMIA 01/28/2009  . RENAL CALCULUS, RIGHT 06/18/2008  . POLYMYOSITIS 06/16/2008  . 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 Left-sided weakness 11/18/2007     Past Medical History:  Diagnosis Date  . Anxiety   . Hyperlipidemia   . HYPERTENSION, BENIGN ESSENTIAL 06/16/2008   Qualifier: Diagnosis of  By: Daphine Deutscher FNP, Zena Amos    . Polymyositis (HCC) 2009  . RLS (restless legs syndrome)   . Vitamin D deficiency      Past Surgical History:  Procedure Laterality Date  . BRAIN SURGERY       Family History  Problem Relation Age of Onset  . Hepatitis Mother   . Diabetes Paternal Uncle   . Diabetes Maternal Grandmother   . Heart disease Paternal Grandfather   . Breast cancer Maternal Aunt        ? age of onset     Social History   Socioeconomic History  . Marital status: Single    Spouse  name: Not on file  . Number of children: Not on file  . Years of education: Not on file  . Highest education level: Not on file  Occupational History  . Not on file  Tobacco Use  . Smoking status: Never Smoker  . Smokeless tobacco: Never Used  Substance and Sexual Activity  . Alcohol use: No  . Drug use: Yes    Types: Marijuana    Comment: Episodic  . Sexual activity: Yes  Other Topics Concern  . Not on file  Social History Narrative  . Not on file   Social Determinants of  Health   Financial Resource Strain:   . Difficulty of Paying Living Expenses: Not on file  Food Insecurity:   . Worried About Charity fundraiser in the Last Year: Not on file  . Ran Out of Food in the Last Year: Not on file  Transportation Needs:   . Lack of Transportation (Medical): Not on file  . Lack of Transportation (Non-Medical): Not on file  Physical Activity:   . Days of Exercise per Week: Not on file  . Minutes of Exercise per Session: Not on file  Stress:   . Feeling of Stress : Not on file  Social Connections:   . Frequency of Communication with Friends and Family: Not on file  . Frequency of Social Gatherings with Friends and Family: Not on file  . Attends Religious Services: Not on file  . Active Member of Clubs or Organizations: Not on file  . Attends Archivist Meetings: Not on file  . Marital Status: Not on file  Intimate Partner Violence:   . Fear of Current or Ex-Partner: Not on file  . Emotionally Abused: Not on file  . Physically Abused: Not on file  . Sexually Abused: Not on file     Allergies  Allergen Reactions  . Buspirone Nausea Only  . Paxil  [Paroxetine Hcl] Nausea Only     Prior to Admission medications   Medication Sig Start Date End Date Taking? Authorizing Provider  Cholecalciferol (VITAMIN D) 2000 units tablet Take 2 tablets (4,000 Units total) by mouth daily. 01/03/18  Yes Rutherford Guys, MD  HYDROcodone-acetaminophen (NORCO/VICODIN) 5-325 MG tablet Take 1 tablet by mouth 2 (two) times daily as needed. for pain 11/03/18  Yes [provider]  ibuprofen (ADVIL) 600 MG tablet TAKE 1 TABLET BY MOUTH EVERY 8 HOURS 04/23/19  Yes Rutherford Guys, MD  Multiple Vitamins-Minerals (ONE-A-DAY VITACRAVES ADULT) CHEW Chew 1 each by mouth daily.   Yes [provider]  Olopatadine HCl 0.2 % SOLN Apply 1 drop to eye daily. 10/01/17  Yes Stallings, Zoe A, MD  predniSONE (DELTASONE) 10 MG tablet Take 30 mg by mouth daily with  breakfast.   Yes [provider]  riTUXimab (RITUXAN) 500 MG/50ML injection Inject 1,000 mg into the vein every 6 (six) months.    Yes [provider]  valACYclovir (VALTREX) 500 MG tablet Take 1 tablet (500 mg total) by mouth 2 (two) times daily. for 3 days at onset of symptoms related with an outbreak 06/24/19  Yes Rutherford Guys, MD  omeprazole (PRILOSEC) 40 MG capsule Take 40 mg by mouth daily.    [provider]     Depression screen [facility] 2/9 04/23/2019 11/07/2018 11/07/2018 11/07/2018 07/24/2018  Decreased Interest 0 0 0 0 0  Down, Depressed, Hopeless 0 0 0 0 0  PHQ - 2 Score 0 0 0 0 0  Altered  sleeping - 1 - - -  Tired, decreased energy - 1 - - -  Change in appetite - 0 - - -  Feeling bad or failure about yourself  - 0 - - -  Trouble concentrating - 0 - - -  Moving slowly or fidgety/restless - 0 - - -  Suicidal thoughts - 0 - - -  PHQ-9 Score - 2 - - -     Fall Risk  04/23/2019 11/07/2018 07/24/2018 05/08/2018 12/14/2017  Falls in the past year? 0 0 1 No No  Number falls in past yr: 0 - 1 - -  Injury with Fall? 0 0 0 - -  Follow up Falls evaluation completed;Education provided;Falls prevention discussed - - - -    Review of Systems  Constitutional: Negative for chills and fever.  HENT: Negative for hearing loss and tinnitus.   Eyes: Negative for blurred vision.  Respiratory: Negative for cough and shortness of breath.   Cardiovascular: Negative for chest pain, palpitations and leg swelling.  Gastrointestinal: Negative for abdominal pain, blood in stool, constipation, diarrhea, melena, nausea and vomiting.  Genitourinary: Negative for frequency and urgency.  Musculoskeletal: Positive for myalgias.  Neurological: Positive for focal weakness. Negative for dizziness and headaches.  Endo/Heme/Allergies: Negative for polydipsia.  Psychiatric/Behavioral: Negative for depression. The patient is not nervous/anxious and does not have insomnia.   All other  systems reviewed and are negative.   PHYSICAL EXAM: BP (!) 148/77   Pulse 71   Temp 97.6 F (36.4 C) (Temporal)   Resp 18   Ht 5\' 9"  (1.753 m)   Wt 188 lb (85.3 kg)   SpO2 100%   BMI 27.76 kg/m    Wt Readings from Last 3 Encounters:  10/31/19 188 lb (85.3 kg)  04/23/19 185 lb (83.9 kg)  11/07/18 185 lb 9.6 oz (84.2 kg)      Hearing Screening   125Hz  250Hz  500Hz  1000Hz  2000Hz  3000Hz  4000Hz  6000Hz  8000Hz   Right ear:           Left ear:             Visual Acuity Screening   Right eye Left eye Both eyes  Without correction:     With correction: 2020 2020 2015     Physical Exam  Constitutional: She is oriented to person, place, and time and well-developed, well-nourished, and in no distress.  HENT:  Head: Normocephalic and atraumatic.  Right Ear: Hearing, tympanic membrane, external ear and ear canal normal.  Left Ear: Hearing, tympanic membrane, external ear and ear canal normal.  Mouth/Throat: Oropharynx is clear and moist.  Eyes: Pupils are equal, round, and reactive to light. EOM are normal.  Neck: No thyromegaly present.  Cardiovascular: Normal rate, regular rhythm, normal heart sounds and intact distal pulses. Exam reveals no gallop and no friction rub.  No murmur heard. Pulmonary/Chest: Effort normal and breath sounds normal. She has no wheezes. She has no rales.  Abdominal: Soft. Bowel sounds are normal. She exhibits no distension and no mass. There is no abdominal tenderness.  Musculoskeletal:        General: No edema. Normal range of motion.     Cervical back: Neck supple.  Lymphadenopathy:    She has no cervical adenopathy.  Neurological: She is alert and oriented to person, place, and time. She has intact cranial nerves. She displays weakness. Gait abnormal.  At baseline, uses a cane  Skin: Skin is warm and dry.  Psychiatric: Mood and affect normal.  Nursing note and vitals reviewed.    Education/Counseling provided regarding diet and exercise,  prevention of chronic diseases, smoking/tobacco cessation, if applicable, and reviewed "Covered Medicare Preventive Services."   ASSESSMENT/PLAN:  1. Encounter for Medicare annual wellness exam Routine HCM labs ordered. HCM reviewed/discussed. Anticipatory guidance regarding healthy weight, lifestyle and choices given.   2. Vitamin D deficiency - Vitamin D, 25-hydroxy  3. HYPERCHOLESTEROLEMIA - Lipid panel  4. POLYMYOSITIS - CBC - Comprehensive metabolic panel - CK - cc Dr Alphonzo Dublin  5. Localized osteoporosis without current pathological fracture 6. Long term systemic steroid user - DG Bone Density; Future  7. Herpes simplex Restart daily valtrex use  Other orders - predniSONE (DELTASONE) 10 MG tablet; Take 1 tablet (10 mg total) by mouth daily with breakfast. - valACYclovir (VALTREX) 500 MG tablet; Take 1 tablet (500 mg total) by mouth daily. for 3 days at onset of symptoms related with an outbreak - ibuprofen (ADVIL) 600 MG tablet; Take 1 tablet (600 mg total) by mouth every 8 (eight) hours.  Return for for pap .

## 2019-11-01 LAB — CBC
Hematocrit: 40.7 % (ref 34.0–46.6)
Hemoglobin: 14.2 g/dL (ref 11.1–15.9)
MCH: 31.3 pg (ref 26.6–33.0)
MCHC: 34.9 g/dL (ref 31.5–35.7)
MCV: 90 fL (ref 79–97)
Platelets: 363 10*3/uL (ref 150–450)
RBC: 4.53 x10E6/uL (ref 3.77–5.28)
RDW: 13.5 % (ref 11.7–15.4)
WBC: 7.7 10*3/uL (ref 3.4–10.8)

## 2019-11-01 LAB — CK: Total CK: 99 U/L (ref 32–182)

## 2019-11-01 LAB — COMPREHENSIVE METABOLIC PANEL
ALT: 14 IU/L (ref 0–32)
AST: 19 IU/L (ref 0–40)
Albumin/Globulin Ratio: 1.3 (ref 1.2–2.2)
Albumin: 4.6 g/dL (ref 3.8–4.8)
Alkaline Phosphatase: 145 IU/L — ABNORMAL HIGH (ref 39–117)
BUN/Creatinine Ratio: 14 (ref 9–23)
BUN: 11 mg/dL (ref 6–24)
Bilirubin Total: 0.7 mg/dL (ref 0.0–1.2)
CO2: 24 mmol/L (ref 20–29)
Calcium: 9.7 mg/dL (ref 8.7–10.2)
Chloride: 101 mmol/L (ref 96–106)
Creatinine, Ser: 0.79 mg/dL (ref 0.57–1.00)
GFR calc Af Amer: 108 mL/min/{1.73_m2} (ref >59)
GFR calc non Af Amer: 93 mL/min/{1.73_m2} (ref >59)
Globulin, Total: 3.5 g/dL (ref 1.5–4.5)
Glucose: 91 mg/dL (ref 65–99)
Potassium: 3.5 mmol/L (ref 3.5–5.2)
Sodium: 142 mmol/L (ref 134–144)
Total Protein: 8.1 g/dL (ref 6.0–8.5)

## 2019-11-01 LAB — LIPID PANEL
Chol/HDL Ratio: 3.7 ratio (ref 0.0–4.4)
Cholesterol, Total: 230 mg/dL — ABNORMAL HIGH (ref 100–199)
HDL: 63 mg/dL (ref >39)
LDL Chol Calc (NIH): 157 mg/dL — ABNORMAL HIGH (ref 0–99)
Triglycerides: 61 mg/dL (ref 0–149)
VLDL Cholesterol Cal: 10 mg/dL (ref 5–40)

## 2019-11-01 LAB — VITAMIN D 25 HYDROXY (VIT D DEFICIENCY, FRACTURES): Vit D, 25-Hydroxy: 7.2 ng/mL — ABNORMAL LOW (ref 30.0–100.0)

## 2019-11-11 ENCOUNTER — Other Ambulatory Visit (HOSPITAL_COMMUNITY)
Admission: RE | Admit: 2019-11-11 | Discharge: 2019-11-11 | Disposition: A | Discharge status: Home / Self Care | Payer: Medicare Other | Source: Ambulatory Visit | Attending: Family Medicine | Admitting: Family Medicine

## 2019-11-11 ENCOUNTER — Other Ambulatory Visit: Payer: Self-pay

## 2019-11-11 ENCOUNTER — Ambulatory Visit (INDEPENDENT_AMBULATORY_CARE_PROVIDER_SITE_OTHER): Payer: Medicare Other | Admitting: Family Medicine

## 2019-11-11 ENCOUNTER — Encounter: Payer: Self-pay | Admitting: Family Medicine

## 2019-11-11 VITALS — BP 144/87 | HR 61 | Temp 97.3°F | Resp 14 | Ht 69.0 in | Wt 186.0 lb

## 2019-11-11 DIAGNOSIS — Z124 Encounter for screening for malignant neoplasm of cervix: Secondary | ICD-10-CM | POA: Insufficient documentation

## 2019-11-11 DIAGNOSIS — Z1151 Encounter for screening for human papillomavirus (HPV): Secondary | ICD-10-CM | POA: Insufficient documentation

## 2019-11-11 DIAGNOSIS — E559 Vitamin D deficiency, unspecified: Secondary | ICD-10-CM | POA: Diagnosis not present

## 2019-11-11 MED ORDER — VITAMIN D (ERGOCALCIFEROL) 1.25 MG (50000 UNIT) PO CAPS: 50000.0000 [IU] | ORAL_CAPSULE | ORAL | 1 refills | Status: DC

## 2019-11-11 NOTE — Patient Instructions (Signed)
° ° ° °  If you have lab work done today you will be contacted with your lab results within the next 2 weeks.  If you have not heard from us then please contact us. The fastest way to get your results is to register for My Chart. ° ° °IF you received an x-ray today, you will receive an invoice from [facility]. Please contact [facility] at [phone] with questions or concerns regarding your invoice.  ° °IF you received labwork today, you will receive an invoice from [facility]. Please contact [facility] at [phone] with questions or concerns regarding your invoice.  ° °Our billing staff will not be able to assist you with questions regarding bills from these companies. ° °You will be contacted with the lab results as soon as they are available. The fastest way to get your results is to activate your My Chart account. Instructions are located on the last page of this paperwork. If you have not heard from us regarding the results in 2 weeks, please contact this office. °  ° ° ° °

## 2019-11-11 NOTE — Progress Notes (Signed)
3/16/202111:25 AM  [name] 29-May-1976, 44 y.o., female [id number]  Chief Complaint  Patient presents with  . Gynecologic Exam    pt is complaining of heavy periods and severe cramping during menses and would like to discuss birth control.    HPI:   Patient is a 44 y.o. female with past medical history significant for polymyosistis and HLP who presents today for pap  G&Ps: 1001, SVD Pap: last pap 3 years ago, normal, denies any h/o abnormal STD: declines, married, same sex partner Menses: regular, first 2 days heavy and painful, last 5-6days LMP 11/05/2019   Labs reviewed Vitamin D 7 CK normal, cc to neuro Otherwise labs normal x LDL 157   Depression screen [facility] 2/9 11/11/2019 10/31/2019 04/23/2019  Decreased Interest 0 0 0  Down, Depressed, Hopeless 0 0 0  PHQ - 2 Score 0 0 0  Altered sleeping - - -  Tired, decreased energy - - -  Change in appetite - - -  Feeling bad or failure about yourself  - - -  Trouble concentrating - - -  Moving slowly or fidgety/restless - - -  Suicidal thoughts - - -  PHQ-9 Score - - -    Fall Risk  11/11/2019 10/31/2019 04/23/2019 11/07/2018 07/24/2018  Falls in the past year? 0 0 0 0 1  Number falls in past yr: - 0 0 - 1  Injury with Fall? - 0 0 0 0  Follow up Falls evaluation completed Falls evaluation completed Falls evaluation completed;Education provided;Falls prevention discussed - -     Allergies  Allergen Reactions  . Buspirone Nausea Only  . Paxil  [Paroxetine Hcl] Nausea Only    Prior to Admission medications   Medication Sig Start Date End Date Taking? Authorizing Provider  Cholecalciferol (VITAMIN D) 2000 units tablet Take 2 tablets (4,000 Units total) by mouth daily. 01/03/18  Yes Myles Lipps, MD  HYDROcodone-acetaminophen (NORCO/VICODIN) 5-325 MG tablet Take 1 tablet by mouth 2 (two) times daily as needed. for pain 11/03/18  Yes [provider]  ibuprofen (ADVIL) 600 MG tablet Take 1 tablet (600 mg total) by  mouth every 8 (eight) hours. 10/31/19  Yes Myles Lipps, MD  Multiple Vitamins-Minerals (ONE-A-DAY VITACRAVES ADULT) CHEW Chew 1 each by mouth daily.   Yes [provider]  Olopatadine HCl 0.2 % SOLN Apply 1 drop to eye daily. 10/01/17  Yes Stallings, Zoe A, MD  omeprazole (PRILOSEC) 40 MG capsule Take 40 mg by mouth daily.   Yes [provider]  predniSONE (DELTASONE) 10 MG tablet Take 1 tablet (10 mg total) by mouth daily with breakfast. 10/31/19  Yes Myles Lipps, MD  riTUXimab (RITUXAN) 500 MG/50ML injection Inject 1,000 mg into the vein every 6 (six) months.    Yes [provider]  valACYclovir (VALTREX) 500 MG tablet Take 1 tablet (500 mg total) by mouth daily. for 3 days at onset of symptoms related with an outbreak 10/31/19  Yes Myles Lipps, MD    Past Medical History:  Diagnosis Date  . Anxiety   . Hyperlipidemia   . HYPERTENSION, BENIGN ESSENTIAL 06/16/2008   Qualifier: Diagnosis of  By: Daphine Deutscher FNP, Zena Amos    . Polymyositis (HCC) 2009  . RLS (restless legs syndrome)   . Vitamin D deficiency     Past Surgical History:  Procedure Laterality Date  . BRAIN SURGERY      Social History   Tobacco Use  . Smoking status: Never Smoker  .  Smokeless tobacco: Never Used  Substance Use Topics  . Alcohol use: No    Family History  Problem Relation Age of Onset  . Hepatitis Mother   . Diabetes Paternal Uncle   . Diabetes Maternal Grandmother   . Heart disease Paternal Grandfather   . Breast cancer Maternal Aunt        ? age of onset    ROS Per hpi  OBJECTIVE:  Today's Vitals   11/11/19 1106  BP: (!) 144/87  Pulse: 61  Resp: 14  Temp: (!) 97.3 F (36.3 C)  TempSrc: Temporal  SpO2: 100%  Weight: 186 lb (84.4 kg)  Height: 5\' 9"  (1.753 m)   Body mass index is 27.47 kg/m.   Physical Exam Vitals and nursing note reviewed. Exam conducted with a chaperone present.  Constitutional:      Appearance: She is well-developed.  HENT:       Head: Normocephalic and atraumatic.  Eyes:     General: No scleral icterus.    Conjunctiva/sclera: Conjunctivae normal.     Pupils: Pupils are equal, round, and reactive to light.  Pulmonary:     Effort: Pulmonary effort is normal.  Genitourinary:    Labia:        Right: No rash or lesion.        Left: No rash or lesion.      Vagina: No vaginal discharge, erythema or lesions.     Cervix: Friability present. No cervical motion tenderness, discharge, lesion or cervical bleeding.     Uterus: Not enlarged, not fixed and not tender.      Adnexa:        Right: No mass or tenderness.         Left: No mass or tenderness.    Musculoskeletal:     Cervical back: Neck supple.  Skin:    General: Skin is warm and dry.  Neurological:     Mental Status: She is alert and oriented to person, place, and time.     No results found for this or any previous visit (from the past 24 hour(s)).  No results found.   ASSESSMENT and PLAN  1. Cervical cancer screening - Cytology - PAP  2. Vitamin D deficiency - Vitamin D, Ergocalciferol, (DRISDOL) 1.25 MG (50000 UNIT) CAPS capsule; Take 1 capsule (50,000 Units total) by mouth every 7 (seven) days.  Return in about 6 months (around 05/13/2020).    Rutherford Guys, MD [facility] [place], [place] [address] Ph.  [phone] Fax [phone]

## 2019-11-13 LAB — CYTOLOGY - PAP
Comment: NEGATIVE
Diagnosis: NEGATIVE
High risk HPV: NEGATIVE

## 2019-12-08 ENCOUNTER — Telehealth (INDEPENDENT_AMBULATORY_CARE_PROVIDER_SITE_OTHER): Payer: Medicare Other | Admitting: Family Medicine

## 2019-12-08 ENCOUNTER — Other Ambulatory Visit: Payer: Self-pay

## 2019-12-08 DIAGNOSIS — B9689 Other specified bacterial agents as the cause of diseases classified elsewhere: Secondary | ICD-10-CM | POA: Diagnosis not present

## 2019-12-08 DIAGNOSIS — J019 Acute sinusitis, unspecified: Secondary | ICD-10-CM | POA: Diagnosis not present

## 2019-12-08 MED ORDER — FLUCONAZOLE 150 MG PO TABS: 150.0000 mg | ORAL_TABLET | Freq: Once | ORAL | 0 refills | Status: AC

## 2019-12-08 MED ORDER — AMOXICILLIN-POT CLAVULANATE 875-125 MG PO TABS: 1.0000 | ORAL_TABLET | Freq: Two times a day (BID) | ORAL | 0 refills | Status: DC

## 2019-12-08 NOTE — Patient Instructions (Addendum)
° ° ° °  If you have lab work done today you will be contacted with your lab results within the next 2 weeks.  If you have not heard from us then please contact us. The fastest way to get your results is to register for My Chart. ° ° °IF you received an x-ray today, you will receive an invoice from [facility]. Please contact  Radiology at [phone] with questions or concerns regarding your invoice.  ° °IF you received labwork today, you will receive an invoice from [facility]. Please contact [facility] at [phone] with questions or concerns regarding your invoice.  ° °Our billing staff will not be able to assist you with questions regarding bills from these companies. ° °You will be contacted with the lab results as soon as they are available. The fastest way to get your results is to activate your My Chart account. Instructions are located on the last page of this paperwork. If you have not heard from us regarding the results in 2 weeks, please contact this office. °  ° ° ° °

## 2019-12-08 NOTE — Progress Notes (Signed)
Virtual Visit via Video Note  I connected with [name] on 12/08/19 at  1:20 PM EDT by a video enabled telemedicine application and verified that I am speaking with the correct person using two identifiers.  Location: Patient: work Provider: office   I discussed the limitations of evaluation and management by telemedicine and the availability of in person appointments. The patient expressed understanding and agreed to proceed.   Doristine Bosworth, MD   Established Patient Office Visit  Subjective:  Patient ID: [name], female    DOB: 11-11-1975  Age: 44 y.o. MRN: [id number]  CC:  Chief Complaint  Patient presents with  . sore throat and ear pain    R sided throat pain  and R ear pain x 3 days    HPI [name] presents for   Patient reports that 3 days now she has headaches Right side of her neck hurts with swallowing It hurts when she turns her head The pain is ache with soreness when she touches it Symptoms started 3 days ago She no fevers No drainage or cough Reports a sinus infection a while back She tried allegra and throat lozenges Se wears her mask when she leaves the home She works behind a window She has covid vaccine #1 in the left arm.  In the morning she has to cough up phlegm.  Past Medical History:  Diagnosis Date  . Anxiety   . Hyperlipidemia   . HYPERTENSION, BENIGN ESSENTIAL 06/16/2008   Qualifier: Diagnosis of  By: Daphine Deutscher FNP, Zena Amos    . Polymyositis (HCC) 2009  . RLS (restless legs syndrome)   . Vitamin D deficiency     Past Surgical History:  Procedure Laterality Date  . BRAIN SURGERY      Family History  Problem Relation Age of Onset  . Hepatitis Mother   . Diabetes Paternal Uncle   . Diabetes Maternal Grandmother   . Heart disease Paternal Grandfather   . Breast cancer Maternal Aunt        ? age of onset    Social History   Socioeconomic History  . Marital status: Single    Spouse name: Not on file    . Number of children: Not on file  . Years of education: Not on file  . Highest education level: Not on file  Occupational History  . Not on file  Tobacco Use  . Smoking status: Never Smoker  . Smokeless tobacco: Never Used  Substance and Sexual Activity  . Alcohol use: No  . Drug use: Yes    Types: Marijuana    Comment: Episodic  . Sexual activity: Yes  Other Topics Concern  . Not on file  Social History Narrative  . Not on file   Social Determinants of Health   Financial Resource Strain:   . Difficulty of Paying Living Expenses:   Food Insecurity:   . Worried About Programme researcher, broadcasting/film/video in the Last Year:   . Barista in the Last Year:   Transportation Needs:   . Freight forwarder (Medical):   Marland Kitchen Lack of Transportation (Non-Medical):   Physical Activity:   . Days of Exercise per Week:   . Minutes of Exercise per Session:   Stress:   . Feeling of Stress :   Social Connections:   . Frequency of Communication with Friends and Family:   . Frequency of Social Gatherings with Friends and Family:   .  Attends Religious Services:   . Active Member of Clubs or Organizations:   . Attends Archivist Meetings:   Marland Kitchen Marital Status:   Intimate Partner Violence:   . Fear of Current or Ex-Partner:   . Emotionally Abused:   Marland Kitchen Physically Abused:   . Sexually Abused:     Outpatient Medications Prior to Visit  Medication Sig Dispense Refill  . acetaminophen (TYLENOL) 325 MG tablet Take 650 mg by mouth every 6 (six) hours as needed.    . fexofenadine (ALLEGRA) 180 MG tablet Take 180 mg by mouth daily.    Marland Kitchen HYDROcodone-acetaminophen (NORCO/VICODIN) 5-325 MG tablet Take 1 tablet by mouth 2 (two) times daily as needed. for pain    . Multiple Vitamins-Minerals (ONE-A-DAY VITACRAVES ADULT) CHEW Chew 1 each by mouth daily.    . Olopatadine HCl 0.2 % SOLN Apply 1 drop to eye daily. 2.5 mL 3  . omeprazole (PRILOSEC) 40 MG capsule Take 40 mg by mouth daily.    .  predniSONE (DELTASONE) 10 MG tablet Take 1 tablet (10 mg total) by mouth daily with breakfast.    . riTUXimab (RITUXAN) 500 MG/50ML injection Inject 1,000 mg into the vein every 6 (six) months.     . valACYclovir (VALTREX) 500 MG tablet Take 1 tablet (500 mg total) by mouth daily. for 3 days at onset of symptoms related with an outbreak 90 tablet 3  . Vitamin D, Ergocalciferol, (DRISDOL) 1.25 MG (50000 UNIT) CAPS capsule Take 1 capsule (50,000 Units total) by mouth every 7 (seven) days. 12 capsule 1  . ibuprofen (ADVIL) 600 MG tablet Take 1 tablet (600 mg total) by mouth every 8 (eight) hours. (Patient not taking: Reported on 12/08/2019) 30 tablet 3   No facility-administered medications prior to visit.    Allergies  Allergen Reactions  . Buspirone Nausea Only  . Paxil  [Paroxetine Hcl] Nausea Only    ROS Review of Systems See hpi   Objective:    Physical Exam  There were no vitals taken for this visit. Wt Readings from Last 3 Encounters:  11/11/19 186 lb (84.4 kg)  10/31/19 188 lb (85.3 kg)  04/23/19 185 lb (83.9 kg)   Physical Exam  Constitutional: Oriented to person, place, and time. Appears well-developed and well-nourished.  HENT:  Head: Normocephalic and atraumatic.  Eyes: Conjunctivae and EOM are normal.  Pt held phone up to her mouth - tonsil on right enlarged, uvula midline,  Cardiovascular: Normal rate, regular rhythm, normal heart sounds and intact distal pulses.  No murmur heard. Pulmonary/Chest: Effort normal    There are no preventive care reminders to display for this patient.  There are no preventive care reminders to display for this patient.  Lab Results  Component Value Date   TSH 0.972 05/08/2018   Lab Results  Component Value Date   WBC 7.7 10/31/2019   HGB 14.2 10/31/2019   HCT 40.7 10/31/2019   MCV 90 10/31/2019   PLT 363 10/31/2019   Lab Results  Component Value Date   NA 142 10/31/2019   K 3.5 10/31/2019   CO2 24 10/31/2019    GLUCOSE 91 10/31/2019   BUN 11 10/31/2019   CREATININE 0.79 10/31/2019   BILITOT 0.7 10/31/2019   ALKPHOS 145 (H) 10/31/2019   AST 19 10/31/2019   ALT 14 10/31/2019   PROT 8.1 10/31/2019   ALBUMIN 4.6 10/31/2019   CALCIUM 9.7 10/31/2019   ANIONGAP 8 04/24/2017   GFR 154.27 01/29/2014   Lab Results  Component Value Date   CHOL 230 (H) 10/31/2019   Lab Results  Component Value Date   HDL 63 10/31/2019   Lab Results  Component Value Date   LDLCALC 157 (H) 10/31/2019   Lab Results  Component Value Date   TRIG 61 10/31/2019   Lab Results  Component Value Date   CHOLHDL 3.7 10/31/2019   Lab Results  Component Value Date   HGBA1C 5.3 01/09/2008      Assessment & Plan:   Problem List Items Addressed This Visit    None    Visit Diagnoses    Acute non-recurrent sinusitis, unspecified location    -  Primary   Relevant Medications   fexofenadine (ALLEGRA) 180 MG tablet   amoxicillin-clavulanate (AUGMENTIN) 875-125 MG tablet   fluconazole (DIFLUCAN) 150 MG tablet     -  Discussed treatment with antibiotic also advised testing for COVID -  Discussed treatment with ABX  -  She should get the covid vaccine as long as her covid test is negative   Follow Up Instructions:    I discussed the assessment and treatment plan with the patient. The patient was provided an opportunity to ask questions and all were answered. The patient agreed with the plan and demonstrated an understanding of the instructions.   The patient was advised to call back or seek an in-person evaluation if the symptoms worsen or if the condition fails to improve as anticipated.  I provided 15 minutes of non-face-to-face time during this encounter.    Meds ordered this encounter  Medications  . amoxicillin-clavulanate (AUGMENTIN) 875-125 MG tablet    Sig: Take 1 tablet by mouth 2 (two) times daily.    Dispense:  20 tablet    Refill:  0  . fluconazole (DIFLUCAN) 150 MG tablet    Sig: Take 1  tablet (150 mg total) by mouth once for 1 dose. Repeat second dose in 3 days.    Dispense:  2 tablet    Refill:  0    Follow-up: No follow-ups on file.    Doristine Bosworth, MD

## 2020-02-05 ENCOUNTER — Other Ambulatory Visit: Payer: Medicaid Other

## 2020-03-04 DIAGNOSIS — G729 Myopathy, unspecified: Secondary | ICD-10-CM | POA: Diagnosis not present

## 2020-03-11 DIAGNOSIS — M3320 Polymyositis, organ involvement unspecified: Secondary | ICD-10-CM | POA: Diagnosis not present

## 2020-03-11 DIAGNOSIS — Z79899 Other long term (current) drug therapy: Secondary | ICD-10-CM | POA: Diagnosis not present

## 2020-04-12 ENCOUNTER — Other Ambulatory Visit: Payer: Self-pay

## 2020-04-12 ENCOUNTER — Ambulatory Visit
Admission: RE | Admit: 2020-04-12 | Discharge: 2020-04-12 | Disposition: A | Discharge status: Home / Self Care | Payer: Medicaid Other | Source: Ambulatory Visit | Attending: Family Medicine | Admitting: Family Medicine

## 2020-04-12 DIAGNOSIS — Z780 Asymptomatic menopausal state: Secondary | ICD-10-CM | POA: Diagnosis not present

## 2020-04-12 DIAGNOSIS — M810 Age-related osteoporosis without current pathological fracture: Secondary | ICD-10-CM | POA: Diagnosis not present

## 2020-04-12 DIAGNOSIS — M816 Localized osteoporosis [Lequesne]: Secondary | ICD-10-CM

## 2020-05-10 ENCOUNTER — Encounter: Payer: Self-pay | Admitting: Family Medicine

## 2020-05-10 ENCOUNTER — Telehealth (INDEPENDENT_AMBULATORY_CARE_PROVIDER_SITE_OTHER): Payer: Medicare Other | Admitting: Family Medicine

## 2020-05-10 ENCOUNTER — Other Ambulatory Visit: Payer: Self-pay

## 2020-05-10 VITALS — Ht 69.0 in | Wt 184.0 lb

## 2020-05-10 DIAGNOSIS — R52 Pain, unspecified: Secondary | ICD-10-CM | POA: Diagnosis not present

## 2020-05-10 MED ORDER — HYDROCODONE-ACETAMINOPHEN 5-325 MG PO TABS: 1.0000 | ORAL_TABLET | Freq: Four times a day (QID) | ORAL | 0 refills | Status: AC | PRN

## 2020-05-10 NOTE — Patient Instructions (Signed)
° ° ° °  If you have lab work done today you will be contacted with your lab results within the next 2 weeks.  If you have not heard from us then please contact us. The fastest way to get your results is to register for My Chart. ° ° °IF you received an x-ray today, you will receive an invoice from [facility]. Please contact [facility] at [phone] with questions or concerns regarding your invoice.  ° °IF you received labwork today, you will receive an invoice from [facility]. Please contact [facility] at [phone] with questions or concerns regarding your invoice.  ° °Our billing staff will not be able to assist you with questions regarding bills from these companies. ° °You will be contacted with the lab results as soon as they are available. The fastest way to get your results is to activate your My Chart account. Instructions are located on the last page of this paperwork. If you have not heard from us regarding the results in 2 weeks, please contact this office. °  ° ° ° °

## 2020-05-10 NOTE — Progress Notes (Addendum)
Virtual Visit Note  I connected with patient on 05/10/20 at 1130am by video doximity and verified that I am speaking with the correct person using two identifiers. [name] is currently located at home and patient is currently with them during visit. The provider, Myles Lipps, MD is located in their office at time of visit.  I discussed the limitations, risks, security and privacy concerns of performing an evaluation and management service by telephone and the availability of in person appointments. I also discussed with the patient that there may be a patient responsible charge related to this service. The patient expressed understanding and agreed to proceed.   I provided 13 minutes of non-face-to-face time during this encounter.  Chief Complaint  Patient presents with  . Follow-up    on privious PAP from 6 months ago and Vitamin D.  . Generalized Body Aches    Pt reports having intense Body aches and head aches that started yesterday morning. pt reports a slightly runny nose. pt  reports she has tried taking some OVC pain medication and it hasn't helped at all.    HPI  Having body aches, weak and headaches with minimal runny nose since yesterday Feels that she cant keep her head up Pain intense enough that she has been crying Pain more intense than her previous myositis pain Having ear pain Not around any one that has been sick No cough, no SOB, no fever or chills Normal sense of taste or smell Will be getting covid in 2 days from now thru [facility] She got her booster last week No rashes Has been taking ibuprofen and APAP which is not helping Prednisone 5mg  daily and rituximab, prednisone decreased at last OV with neuro in July 2021 pmp reviewed  Patient presents a negative covid and flu test she had done 05/10/2020 at night Reached out to neuro, he would like CK checked   Allergies  Allergen Reactions  . Buspirone Nausea Only  . Paxil  [Paroxetine Hcl] Nausea Only     Prior to Admission medications   Medication Sig Start Date End Date Taking? Authorizing Provider  acetaminophen (TYLENOL) 325 MG tablet Take 650 mg by mouth every 6 (six) hours as needed.   Yes [provider]  amoxicillin-clavulanate (AUGMENTIN) 875-125 MG tablet Take 1 tablet by mouth 2 (two) times daily. 12/08/19  Yes Stallings, Zoe A, MD  fexofenadine (ALLEGRA) 180 MG tablet Take 180 mg by mouth daily.   Yes [provider]  HYDROcodone-acetaminophen (NORCO/VICODIN) 5-325 MG tablet Take 1 tablet by mouth 2 (two) times daily as needed. for pain 11/03/18  Yes [provider]  ibuprofen (ADVIL) 600 MG tablet Take 1 tablet (600 mg total) by mouth every 8 (eight) hours. 10/31/19  Yes 12/31/19, MD  Multiple Vitamins-Minerals (ONE-A-DAY VITACRAVES ADULT) CHEW Chew 1 each by mouth daily.   Yes [provider]  Olopatadine HCl 0.2 % SOLN Apply 1 drop to eye daily. 10/01/17  Yes Stallings, Zoe A, MD  omeprazole (PRILOSEC) 40 MG capsule Take 40 mg by mouth daily.   Yes [provider]  predniSONE (DELTASONE) 10 MG tablet Take 1 tablet (10 mg total) by mouth daily with breakfast. 10/31/19  Yes 12/31/19, MD  riTUXimab (RITUXAN) 500 MG/50ML injection Inject 1,000 mg into the vein every 6 (six) months.    Yes [provider]  valACYclovir (VALTREX) 500 MG tablet Take 1 tablet (500 mg total) by mouth daily. for 3 days at  onset of symptoms related with an outbreak 10/31/19  Yes Myles Lipps, MD  Vitamin D, Ergocalciferol, (DRISDOL) 1.25 MG (50000 UNIT) CAPS capsule Take 1 capsule (50,000 Units total) by mouth every 7 (seven) days. 11/11/19  Yes Myles Lipps, MD    Past Medical History:  Diagnosis Date  . Anxiety   . Hyperlipidemia   . HYPERTENSION, BENIGN ESSENTIAL 06/16/2008   Qualifier: Diagnosis of  By: Daphine Deutscher FNP, Zena Amos    . Polymyositis (HCC) 2009  . RLS (restless legs syndrome)   . Vitamin D deficiency     Past Surgical  History:  Procedure Laterality Date  . BRAIN SURGERY      Social History   Tobacco Use  . Smoking status: Never Smoker  . Smokeless tobacco: Never Used  Substance Use Topics  . Alcohol use: No    Family History  Problem Relation Age of Onset  . Hepatitis Mother   . Diabetes Paternal Uncle   . Diabetes Maternal Grandmother   . Heart disease Paternal Grandfather   . Breast cancer Maternal Aunt        ? age of onset    ROS Per hpi  Objective  Vitals as reported by the patient: none  GEN: AAOx3, NAD, tearful due to pain HEENT: [place]/AT, pupils are symmetrical, EOMI, non-icteric sclera Resp: breathing comfortably, speaking in full sentences Psych: good eye contact, normal mood and affect  Patient seen briefly when she came in for lab work on sept 14 2021: Gait is unsteady, broad based, worse than her baseline gait Heliotrope rash noted around her eyes BUE: Shoulders 3-4/5 bilaterally, elbow 4/5, weak grip BLE: hip flexors 3-4/5 bilaterally, knee and ankle 4/5 RRR no m/r/g CTAB no w/r/r    ASSESSMENT and PLAN  1. Generalized body aches Severe per patient. covid testing pending. Cont quarantine for now. rx for vicodin given. If neg covid test, then should have myositis labs.   05/11/2020: covid negative. Myositis labs ordered (CK which will be cc'd to her neuro Dr Alphonzo Dublin whom will manage treatment), also ordered CBC and CMP for baseline.   Other orders - HYDROcodone-acetaminophen (NORCO/VICODIN) 5-325 MG tablet; Take 1 tablet by mouth every 6 (six) hours as needed.  FOLLOW-UP: prn   The above assessment and management plan was discussed with the patient. The patient verbalized understanding of and has agreed to the management plan. Patient is aware to call the clinic if symptoms persist or worsen. Patient is aware when to return to the clinic for a follow-up visit. Patient educated on when it is appropriate to go to the emergency department.     Myles Lipps,  MD Primary Care at [facility] [address] [place], [place] [address] Ph.  [phone] Fax [phone]

## 2020-05-11 ENCOUNTER — Ambulatory Visit: Payer: Medicare Other

## 2020-05-11 ENCOUNTER — Telehealth (INDEPENDENT_AMBULATORY_CARE_PROVIDER_SITE_OTHER): Payer: Medicare Other | Admitting: Family Medicine

## 2020-05-11 DIAGNOSIS — M3320 Polymyositis, organ involvement unspecified: Secondary | ICD-10-CM | POA: Diagnosis not present

## 2020-05-11 NOTE — Progress Notes (Signed)
Please send results to [employer] at [phone]

## 2020-05-12 ENCOUNTER — Telehealth: Payer: Self-pay | Admitting: Family Medicine

## 2020-05-12 LAB — CBC WITH DIFFERENTIAL/PLATELET
Basophils Absolute: 0 x10E3/uL (ref 0.0–0.2)
Basos: 0 %
EOS (ABSOLUTE): 0.1 x10E3/uL (ref 0.0–0.4)
Eos: 1 %
Hematocrit: 45.8 % (ref 34.0–46.6)
Hemoglobin: 15.9 g/dL (ref 11.1–15.9)
Immature Grans (Abs): 0 x10E3/uL (ref 0.0–0.1)
Immature Granulocytes: 0 %
Lymphocytes Absolute: 1.2 x10E3/uL (ref 0.7–3.1)
Lymphs: 10 %
MCH: 31.1 pg (ref 26.6–33.0)
MCHC: 34.7 g/dL (ref 31.5–35.7)
MCV: 90 fL (ref 79–97)
Monocytes Absolute: 0.5 x10E3/uL (ref 0.1–0.9)
Monocytes: 4 %
Neutrophils Absolute: 9.6 x10E3/uL — ABNORMAL HIGH (ref 1.4–7.0)
Neutrophils: 85 %
Platelets: 373 x10E3/uL (ref 150–450)
RBC: 5.11 x10E6/uL (ref 3.77–5.28)
RDW: 14.3 % (ref 11.7–15.4)
WBC: 11.4 x10E3/uL — ABNORMAL HIGH (ref 3.4–10.8)

## 2020-05-12 LAB — COMPREHENSIVE METABOLIC PANEL
ALT: 11 IU/L (ref 0–32)
AST: 18 IU/L (ref 0–40)
Albumin/Globulin Ratio: 1.5 (ref 1.2–2.2)
Albumin: 4.5 g/dL (ref 3.8–4.8)
Alkaline Phosphatase: 94 IU/L (ref 44–121)
BUN/Creatinine Ratio: 24 — ABNORMAL HIGH (ref 9–23)
BUN: 11 mg/dL (ref 6–24)
Bilirubin Total: 0.4 mg/dL (ref 0.0–1.2)
CO2: 16 mmol/L — ABNORMAL LOW (ref 20–29)
Calcium: 9.3 mg/dL (ref 8.7–10.2)
Chloride: 108 mmol/L — ABNORMAL HIGH (ref 96–106)
Creatinine, Ser: 0.45 mg/dL — ABNORMAL LOW (ref 0.57–1.00)
GFR calc Af Amer: 143 mL/min/{1.73_m2} (ref >59)
GFR calc non Af Amer: 124 mL/min/{1.73_m2} (ref >59)
Globulin, Total: 3.1 g/dL (ref 1.5–4.5)
Glucose: 98 mg/dL (ref 65–99)
Potassium: 4.3 mmol/L (ref 3.5–5.2)
Sodium: 141 mmol/L (ref 134–144)
Total Protein: 7.6 g/dL (ref 6.0–8.5)

## 2020-05-12 LAB — CK: Total CK: 310 U/L — ABNORMAL HIGH (ref 32–182)

## 2020-05-12 NOTE — Telephone Encounter (Signed)
Pt requesting lab results.

## 2020-05-12 NOTE — Telephone Encounter (Signed)
Pt would like a call with recent labs results . Still in a lot of pain

## 2020-05-13 NOTE — Telephone Encounter (Signed)
Labs followed by her neuro

## 2020-05-13 NOTE — Telephone Encounter (Signed)
Labs were faxed yesterday to her neurologist, she should followoup with him in regards to further treatment changes. thanks

## 2020-08-25 ENCOUNTER — Telehealth (INDEPENDENT_AMBULATORY_CARE_PROVIDER_SITE_OTHER): Payer: Medicare Other | Admitting: Family Medicine

## 2020-08-25 ENCOUNTER — Encounter: Payer: Self-pay | Admitting: Family Medicine

## 2020-08-25 ENCOUNTER — Other Ambulatory Visit: Payer: Self-pay

## 2020-08-25 DIAGNOSIS — F32A Depression, unspecified: Secondary | ICD-10-CM | POA: Diagnosis not present

## 2020-08-25 MED ORDER — CITALOPRAM HYDROBROMIDE 20 MG PO TABS: 20.0000 mg | ORAL_TABLET | Freq: Every day | ORAL | 1 refills | Status: DC

## 2020-08-25 NOTE — Progress Notes (Signed)
Virtual Visit Note  I connected with patient on 08/25/20 at 1043 by telephone due to unable to work Epic video visit and verified that I am speaking with the correct person using two identifiers. [name] is currently located at home and no family members are currently with them during visit. The provider, Azalee Course Kista Robb, FNP is located in their office at time of visit.  I discussed the limitations, risks, security and privacy concerns of performing an evaluation and management service by telephone and the availability of in person appointments. I also discussed with the patient that there may be a patient responsible charge related to this service. The patient expressed understanding and agreed to proceed.   I provided 20 minutes of non-face-to-face time during this encounter.  Chief Complaint  Patient presents with  . Depression    PHQ9-15 GAD 7 -  18     HPI   Has been having issues with depression and anxiety Previously on medication for that 2018:  Cymbalta Has been getting worse over the past month Has had thoughts of suicide, but denies plan Has a hard time getting to sleep   Allergies  Allergen Reactions  . Buspirone Nausea Only  . Paxil  [Paroxetine Hcl] Nausea Only    Prior to Admission medications   Medication Sig Start Date End Date Taking? Authorizing Provider  Calcium Carb-Cholecalciferol (CALCIUM 1000 + D PO) Take by mouth.   Yes [provider]  cycloSPORINE (RESTASIS) 0.05 % ophthalmic emulsion 1 drop 2 (two) times daily.   Yes [provider]  HYDROcodone-acetaminophen (NORCO/VICODIN) 5-325 MG tablet Take 1 tablet by mouth every 6 (six) hours as needed. 05/10/20  Yes Lezlie Lye, Meda Coffee, MD  Multiple Vitamins-Minerals (ONE-A-DAY VITACRAVES ADULT) CHEW Chew 1 each by mouth daily.   Yes [provider]  predniSONE (DELTASONE) 10 MG tablet Take 1 tablet (10 mg total) by mouth daily with breakfast. 10/31/19  Yes Lezlie Lye,  Irma M, MD  riTUXimab (RITUXAN) 500 MG/50ML injection Inject 1,000 mg into the vein every 6 (six) months.    Yes [provider]  valACYclovir (VALTREX) 500 MG tablet Take 1 tablet (500 mg total) by mouth daily. for 3 days at onset of symptoms related with an outbreak 10/31/19  Yes Lezlie Lye, Meda Coffee, MD  fexofenadine (ALLEGRA) 180 MG tablet Take 180 mg by mouth daily. Patient not taking: Reported on 08/25/2020    [provider]  ibuprofen (ADVIL) 600 MG tablet Take 1 tablet (600 mg total) by mouth every 8 (eight) hours. Patient not taking: Reported on 08/25/2020 10/31/19   Lezlie Lye, Meda Coffee, MD  omeprazole (PRILOSEC) 40 MG capsule Take 40 mg by mouth daily. Patient not taking: Reported on 08/25/2020    [provider]  Vitamin D, Ergocalciferol, (DRISDOL) 1.25 MG (50000 UNIT) CAPS capsule Take 1 capsule (50,000 Units total) by mouth every 7 (seven) days. Patient not taking: Reported on 08/25/2020 11/11/19   Lezlie Lye, Meda Coffee, MD    Past Medical History:  Diagnosis Date  . Anxiety   . Hyperlipidemia   . HYPERTENSION, BENIGN ESSENTIAL 06/16/2008   Qualifier: Diagnosis of  By: Daphine Deutscher FNP, Zena Amos    . Polymyositis (HCC) 2009  . RLS (restless legs syndrome)   . Vitamin D deficiency     Past Surgical History:  Procedure Laterality Date  . BRAIN SURGERY      Social History   Tobacco Use  . Smoking status: Never Smoker  .  Smokeless tobacco: Never Used  Substance Use Topics  . Alcohol use: No    Family History  Problem Relation Age of Onset  . Hepatitis Mother   . Diabetes Paternal Uncle   . Diabetes Maternal Grandmother   . Heart disease Paternal Grandfather   . Breast cancer Maternal Aunt        ? age of onset    Review of Systems  Constitutional: Positive for malaise/fatigue. Negative for chills and fever.  Eyes: Negative for blurred vision and double vision.  Respiratory: Negative for cough, shortness of breath and wheezing.    Cardiovascular: Negative for chest pain, palpitations and leg swelling.  Gastrointestinal: Negative for abdominal pain, blood in stool, constipation, diarrhea, heartburn, nausea and vomiting.  Genitourinary: Negative for dysuria, frequency and hematuria.  Musculoskeletal: Positive for myalgias. Negative for back pain and joint pain.  Skin: Negative for rash.  Neurological: Negative for dizziness, weakness and headaches.  Psychiatric/Behavioral: Positive for depression and suicidal ideas. The patient is nervous/anxious and has insomnia.     Objective  Constitutional:      General: She is not in acute distress.    Appearance: Normal appearance. She is not ill-appearing.   Pulmonary:     Effort: Pulmonary effort is normal. No respiratory distress.  Neurological:     Mental Status: She is alert and oriented to person, place, and time.  Psychiatric:        Mood and Affect: Mood normal.        Behavior: Behavior normal.     ASSESSMENT and PLAN  Problem List Items Addressed This Visit   None   Visit Diagnoses    Depression, unspecified depression type    -  Primary   Relevant Medications   citalopram (CELEXA) 20 MG tablet  Start taking daily Will f/u in 5 weeks RTC precautions provided Safety numbers provided and resources Counseling resources provided R/se/b of medications discussed      Return in about 5 weeks (around 09/29/2020) for medication follow up.   The above assessment and management plan was discussed with the patient. The patient verbalized understanding of and has agreed to the management plan. Patient is aware to call the clinic if symptoms persist or worsen. Patient is aware when to return to the clinic for a follow-up visit. Patient educated on when it is appropriate to go to the emergency department.     Macario Carls Dangela How, FNP-BC Primary Care at [facility] [address] [place], [place] [address] Ph.  [phone] Fax [phone]

## 2020-08-25 NOTE — Patient Instructions (Addendum)
For therapy -- 1. [facility] ([address] Hulan Amato McCoy,Heather Joycelyn Schmid [place]) [phone] 2. [facility] Raynelle Fanning [place]) [phone] 3. [facility] - [phone] 4. [facility] - [phone] (do not file insurance) 5. [facility] - accepts most major insurances. [phone] or email [email or web address]    Citalopram tablets What is this medicine? CITALOPRAM (sye TAL oh pram) is a medicine for depression. This medicine may be used for other purposes; ask your health care provider or pharmacist if you have questions. COMMON BRAND NAME(S): Celexa What should I tell my health care provider before I take this medicine? They need to know if you have any of these conditions:  bleeding disorders  bipolar disorder or a family history of bipolar disorder  glaucoma  heart disease  history of irregular heartbeat  kidney disease  liver disease  low levels of magnesium or potassium in the blood  receiving electroconvulsive therapy  seizures  suicidal thoughts, plans, or attempt; a previous suicide attempt by you or a family member  take medicines that treat or prevent blood clots  thyroid disease  an unusual or allergic reaction to citalopram, escitalopram, other medicines, foods, dyes, or preservatives  pregnant or trying to become pregnant  breast-feeding How should I use this medicine? Take this medicine by mouth with a glass of water. Follow the directions on the prescription label. You can take it with or without food. Take your medicine at regular intervals. Do not take your medicine more often than directed. Do not stop taking this medicine suddenly except upon the advice of your doctor. Stopping this medicine too quickly may cause serious side effects or your condition may worsen. A special MedGuide will be given to you by the pharmacist with  each prescription and refill. Be sure to read this information carefully each time. Talk to your pediatrician regarding the use of this medicine in children. Special care may be needed. Patients over 110 years old may have a stronger reaction and need a smaller dose. Overdosage: If you think you have taken too much of this medicine contact a poison control center or emergency room at once. NOTE: This medicine is only for you. Do not share this medicine with others. What if I miss a dose? If you miss a dose, take it as soon as you can. If it is almost time for your next dose, take only that dose. Do not take double or extra doses. What may interact with this medicine? Do not take this medicine with any of the following medications:  certain medicines for fungal infections like fluconazole, itraconazole, ketoconazole, posaconazole, voriconazole  cisapride  dronedarone  escitalopram  linezolid  MAOIs like Carbex, Eldepryl, Marplan, Nardil, and Parnate  methylene blue (injected into a vein)  pimozide  thioridazine This medicine may also interact with the following medications:  alcohol  amphetamines  aspirin and aspirin-like medicines  carbamazepine  certain medicines for depression, anxiety, or psychotic disturbances  certain medicines for infections like chloroquine, clarithromycin, erythromycin, furazolidone, isoniazid, pentamidine  certain medicines for migraine headaches like almotriptan, eletriptan, frovatriptan, naratriptan, rizatriptan, sumatriptan, zolmitriptan  certain medicines for sleep  certain medicines that treat or prevent blood clots like dalteparin, enoxaparin, warfarin  cimetidine  diuretics  dofetilide  fentanyl  lithium  methadone  metoprolol  NSAIDs, medicines for pain and inflammation, like ibuprofen or naproxen  omeprazole  other medicines that prolong the QT interval (cause an abnormal heart  rhythm)  procarbazine  rasagiline  supplements like St. John's wort, kava kava, valerian  tramadol  tryptophan  ziprasidone This list may not describe all possible interactions. Give your health care provider a list of all the medicines, herbs, non-prescription drugs, or dietary supplements you use. Also tell them if you smoke, drink alcohol, or use illegal drugs. Some items may interact with your medicine. What should I watch for while using this medicine? Tell your doctor if your symptoms do not get better or if they get worse. Visit your doctor or health care professional for regular checks on your progress. Because it may take several weeks to see the full effects of this medicine, it is important to continue your treatment as prescribed by your doctor. Patients and their families should watch out for new or worsening thoughts of suicide or depression. Also watch out for sudden changes in feelings such as feeling anxious, agitated, panicky, irritable, hostile, aggressive, impulsive, severely restless, overly excited and hyperactive, or not being able to sleep. If this happens, especially at the beginning of treatment or after a change in dose, call your health care professional. Bonita Quin may get drowsy or dizzy. Do not drive, use machinery, or do anything that needs mental alertness until you know how this medicine affects you. Do not stand or sit up quickly, especially if you are an older patient. This reduces the risk of dizzy or fainting spells. Alcohol may interfere with the effect of this medicine. Avoid alcoholic drinks. Your mouth may get dry. Chewing sugarless gum or sucking hard candy, and drinking plenty of water will help. Contact your doctor if the problem does not go away or is severe. What side effects may I notice from receiving this medicine? Side effects that you should report to your doctor or health care professional as soon as possible:  allergic reactions like skin rash,  itching or hives, swelling of the face, lips, or tongue  anxious  black, tarry stools  breathing problems  changes in vision  chest pain  confusion  elevated mood, decreased need for sleep, racing thoughts, impulsive behavior  eye pain  fast, irregular heartbeat  feeling faint or lightheaded, falls  feeling agitated, angry, or irritable  hallucination, loss of contact with reality  loss of balance or coordination  loss of memory  painful or prolonged erections  restlessness, pacing, inability to keep still  seizures  stiff muscles  suicidal thoughts or other mood changes  trouble sleeping  unusual bleeding or bruising  unusually weak or tired  vomiting Side effects that usually do not require medical attention (report to your doctor or health care professional if they continue or are bothersome):  change in appetite or weight  change in sex drive or performance  dizziness  headache  increased sweating  indigestion, nausea  tremors This list may not describe all possible side effects. Call your doctor for medical advice about side effects. You may report side effects to FDA at 1-800-FDA-1088. Where should I keep my medicine? Keep out of reach of children. Store at room temperature between 15 and 30 degrees C (59 and 86 degrees F). Throw away any unused medicine after the expiration date. NOTE: This sheet is a summary. It may not cover all possible information. If you have questions about this medicine, talk to your doctor, pharmacist, or health care provider.  2020 Elsevier/Gold Standard (2018-08-05 09:05:36) Living With Depression Everyone experiences occasional disappointment, sadness, and loss in their lives. When you are feeling down, blue, or  sad for at least 2 weeks in a row, it may mean that you have depression. Depression can affect your thoughts and feelings, relationships, daily activities, and physical health. It is caused by changes in  the way your brain functions. If you receive a diagnosis of depression, your health care provider will tell you which type of depression you have and what treatment options are available to you. If you are living with depression, there are ways to help you recover from it and also ways to prevent it from coming back. How to cope with lifestyle changes Coping with stress     Stress is your bodys reaction to life changes and events, both good and bad. Stressful situations may include:  Getting married.  The death of a spouse.  Losing a job.  Retiring.  Having a baby. Stress can last just a few hours or it can be ongoing. Stress can play a major role in depression, so it is important to learn both how to cope with stress and how to think about it differently. Talk with your health care provider or a counselor if you would like to learn more about stress reduction. He or she may suggest some stress reduction techniques, such as:  Music therapy. This can include creating music or listening to music. Choose music that you enjoy and that inspires you.  Mindfulness-based meditation. This kind of meditation can be done while sitting or walking. It involves being aware of your normal breaths, rather than trying to control your breathing.  Centering prayer. This is a kind of meditation that involves focusing on a spiritual word or phrase. Choose a word, phrase, or sacred image that is meaningful to you and that brings you peace.  Deep breathing. To do this, expand your stomach and inhale slowly through your nose. Hold your breath for 3-5 seconds, then exhale slowly, allowing your stomach muscles to relax.  Muscle relaxation. This involves intentionally tensing muscles then relaxing them. Choose a stress reduction technique that fits your lifestyle and personality. Stress reduction techniques take time and practice to develop. Set aside 5-15 minutes a day to do them. Therapists can offer training  in these techniques. The training may be covered by some insurance plans. Other things you can do to manage stress include:  Keeping a stress diary. This can help you learn what triggers your stress and ways to control your response.  Understanding what your limits are and saying no to requests or events that lead to a schedule that is too full.  Thinking about how you respond to certain situations. You may not be able to control everything, but you can control how you react.  Adding humor to your life by watching funny films or TV shows.  Making time for activities that help you relax and not feeling guilty about spending your time this way.  Medicines Your health care provider may suggest certain medicines if he or she feels that they will help improve your condition. Avoid using alcohol and other substances that may prevent your medicines from working properly (may interact). It is also important to:  Talk with your pharmacist or health care provider about all the medicines that you take, their possible side effects, and what medicines are safe to take together.  Make it your goal to take part in all treatment decisions (shared decision-making). This includes giving input on the side effects of medicines. It is best if shared decision-making with your health care provider is part of  your total treatment plan. If your health care provider prescribes a medicine, you may not notice the full benefits of it for 4-8 weeks. Most people who are treated for depression need to be on medicine for at least 6-12 months after they feel better. If you are taking medicines as part of your treatment, do not stop taking medicines without first talking to your health care provider. You may need to have the medicine slowly decreased (tapered) over time to decrease the risk of harmful side effects. Relationships Your health care provider may suggest family therapy along with individual therapy and drug therapy.  While there may not be family problems that are causing you to feel depressed, it is still important to make sure your family learns as much as they can about your mental health. Having your familys support can help make your treatment successful. How to recognize changes in your condition Everyone has a different response to treatment for depression. Recovery from major depression happens when you have not had signs of major depression for two months. This may mean that you will start to:  Have more interest in doing activities.  Feel less hopeless than you did 2 months ago.  Have more energy.  Overeat less often, or have better or improving appetite.  Have better concentration. Your health care provider will work with you to decide the next steps in your recovery. It is also important to recognize when your condition is getting worse. Watch for these signs:  Having fatigue or low energy.  Eating too much or too little.  Sleeping too much or too little.  Feeling restless, agitated, or hopeless.  Having trouble concentrating or making decisions.  Having unexplained physical complaints.  Feeling irritable, angry, or aggressive. Get help as soon as you or your family members notice these symptoms coming back. How to get support and help from others How to talk with friends and family members about your condition  Talking to friends and family members about your condition can provide you with one way to get support and guidance. Reach out to trusted friends or family members, explain your symptoms to them, and let them know that you are working with a health care provider to treat your depression. Financial resources Not all insurance plans cover mental health care, so it is important to check with your insurance carrier. If paying for co-pays or counseling services is a problem, search for a local or county mental health care center. They may be able to offer public mental health  care services at low or no cost when you are not able to see a private health care provider. If you are taking medicine for depression, you may be able to get the generic form, which may be less expensive. Some makers of prescription medicines also offer help to patients who cannot afford the medicines they need. Follow these instructions at home:   Get the right amount and quality of sleep.  Cut down on using caffeine, tobacco, alcohol, and other potentially harmful substances.  Try to exercise, such as walking or lifting small weights.  Take over-the-counter and prescription medicines only as told by your health care provider.  Eat a healthy diet that includes plenty of vegetables, fruits, whole grains, low-fat dairy products, and lean protein. Do not eat a lot of foods that are high in solid fats, added sugars, or salt.  Keep all follow-up visits as told by your health care provider. This is important. Contact a health care  provider if:  You stop taking your antidepressant medicines, and you have any of these symptoms: ? Nausea. ? Headache. ? Feeling lightheaded. ? Chills and body aches. ? Not being able to sleep (insomnia).  You or your friends and family think your depression is getting worse. Get help right away if:  You have thoughts of hurting yourself or others. If you ever feel like you may hurt yourself or others, or have thoughts about taking your own life, get help right away. You can go to your nearest emergency department or call:  Your local emergency services (911 in the [place]  A suicide crisis helpline, such as the National Suicide Prevention Lifeline at 606-798-3058. This is open 24-hours a day. Summary  If you are living with depression, there are ways to help you recover from it and also ways to prevent it from coming back.  Work with your health care team to create a management plan that includes counseling, stress management techniques, and healthy  lifestyle habits. This information is not intended to replace advice given to you by your health care provider. Make sure you discuss any questions you have with your health care provider. Document Revised: 12/06/2018 Document Reviewed: 07/17/2016 Elsevier Patient Education  [employer].     If you have lab work done today you will be contacted with your lab results within the next 2 weeks.  If you have not heard from [place] then please contact us. The fastest way to get your results is to register for My Chart.   IF you received an x-ray today, you will receive an invoice from [facility]. Please contact [facility] at [phone] with questions or concerns regarding your invoice.   IF you received labwork today, you will receive an invoice from [facility]. Please contact [facility] at [phone] with questions or concerns regarding your invoice.   Our billing staff will not be able to assist you with questions regarding bills from these companies.  You will be contacted with the lab results as soon as they are available. The fastest way to get your results is to activate your My Chart account. Instructions are located on the last page of this paperwork. If you have not heard from [place] regarding the results in 2 weeks, please contact this office.

## 2020-09-02 DIAGNOSIS — M3320 Polymyositis, organ involvement unspecified: Secondary | ICD-10-CM | POA: Diagnosis not present

## 2020-09-02 DIAGNOSIS — Z79899 Other long term (current) drug therapy: Secondary | ICD-10-CM | POA: Diagnosis not present

## 2020-09-20 ENCOUNTER — Other Ambulatory Visit: Payer: Self-pay | Admitting: Family Medicine

## 2020-09-20 DIAGNOSIS — F32A Depression, unspecified: Secondary | ICD-10-CM

## 2020-09-20 NOTE — Telephone Encounter (Signed)
   Notes to clinic:  requesting a 90 day supply  Requested Prescriptions  Pending Prescriptions Disp Refills   citalopram (CELEXA) 20 MG tablet [Pharmacy Med Name: CITALOPRAM HBR 20 MG TABLET] 90 tablet 2    Sig: TAKE 1 TABLET BY MOUTH EVERY DAY      Psychiatry:  Antidepressants - SSRI Passed - 09/20/2020  9:31 AM      Passed - Valid encounter within last 6 months    Recent Outpatient Visits           3 weeks ago Depression, unspecified depression type   [facility], Azalee Course, FNP   4 months ago Polymyositis [facility])   [facility] at [facility], Meda Coffee, MD   4 months ago Generalized body aches   Primary Care at [facility], Meda Coffee, MD   9 months ago Acute non-recurrent sinusitis, unspecified location   [facility] Care at [facility], Manus Rudd, MD   10 months ago Cervical cancer screening   Primary Care at [facility], Meda Coffee, MD       Future Appointments             In 1 week Just, Azalee Course, FNP Primary Care at [place], [facility]   In 2 months Just, Azalee Course, FNP Primary Care at [place], [facility]

## 2020-09-27 DIAGNOSIS — H5213 Myopia, bilateral: Secondary | ICD-10-CM | POA: Diagnosis not present

## 2020-09-29 ENCOUNTER — Ambulatory Visit: Payer: Medicare Other | Admitting: Family Medicine

## 2020-09-30 ENCOUNTER — Encounter: Payer: Self-pay | Admitting: Family Medicine

## 2020-10-01 ENCOUNTER — Encounter (HOSPITAL_COMMUNITY): Payer: Self-pay

## 2020-10-01 ENCOUNTER — Ambulatory Visit (HOSPITAL_COMMUNITY)
Admission: EM | Admit: 2020-10-01 | Discharge: 2020-10-01 | Disposition: A | Discharge status: Home / Self Care | Payer: Medicare Other | Attending: Psychiatry | Admitting: Psychiatry

## 2020-10-01 ENCOUNTER — Other Ambulatory Visit: Payer: Self-pay

## 2020-10-01 DIAGNOSIS — F331 Major depressive disorder, recurrent, moderate: Secondary | ICD-10-CM | POA: Diagnosis not present

## 2020-10-01 DIAGNOSIS — Z653 Problems related to other legal circumstances: Secondary | ICD-10-CM | POA: Insufficient documentation

## 2020-10-01 DIAGNOSIS — Z818 Family history of other mental and behavioral disorders: Secondary | ICD-10-CM | POA: Insufficient documentation

## 2020-10-01 DIAGNOSIS — Z733 Stress, not elsewhere classified: Secondary | ICD-10-CM | POA: Diagnosis not present

## 2020-10-01 DIAGNOSIS — F321 Major depressive disorder, single episode, moderate: Secondary | ICD-10-CM | POA: Diagnosis not present

## 2020-10-01 DIAGNOSIS — F32A Depression, unspecified: Secondary | ICD-10-CM | POA: Diagnosis present

## 2020-10-01 DIAGNOSIS — Z630 Problems in relationship with spouse or partner: Secondary | ICD-10-CM | POA: Diagnosis not present

## 2020-10-01 NOTE — ED Notes (Signed)
Discharge instructions reviewed and Pt stated understanding. Pt alert, orient and ambulatory. Pt escorted to the front lobby. No personal belongings to be returned.
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
## 2020-10-01 NOTE — ED Triage Notes (Signed)
URGENT-- 45 yo female walk-in endorsing intermittent SI no plan. Pt states, "i've been depressed and my anxiety is getting bad. I can't sleep". Pt tearful on interview. Pt reports that she stopped taking her Celexa one week ago because it was making her not sleep and nauseated. Pt walks with cane due to having muscular disease, polymyositis.
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
## 2020-10-04 ENCOUNTER — Other Ambulatory Visit: Payer: Self-pay

## 2020-10-04 ENCOUNTER — Telehealth: Payer: Self-pay | Admitting: Family Medicine

## 2020-10-04 ENCOUNTER — Ambulatory Visit (INDEPENDENT_AMBULATORY_CARE_PROVIDER_SITE_OTHER): Payer: Medicare Other | Admitting: Licensed Clinical Social Worker

## 2020-10-04 DIAGNOSIS — F3181 Bipolar II disorder: Secondary | ICD-10-CM
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
## 2020-10-04 NOTE — Telephone Encounter (Signed)
FYI:Pt is having a very hard time. She was wanting a sleeping aid to go with her Celexa. I was able to schedule her an app for 10/05/20 with [facility].

## 2020-10-05 ENCOUNTER — Encounter: Payer: Self-pay | Admitting: Family Medicine

## 2020-10-05 ENCOUNTER — Ambulatory Visit (INDEPENDENT_AMBULATORY_CARE_PROVIDER_SITE_OTHER): Payer: Medicare Other | Admitting: Family Medicine

## 2020-10-05 ENCOUNTER — Other Ambulatory Visit: Payer: Self-pay

## 2020-10-05 VITALS — BP 163/74 | HR 83 | Temp 98.1°F | Wt 182.0 lb

## 2020-10-05 DIAGNOSIS — F32A Depression, unspecified: Secondary | ICD-10-CM

## 2020-10-05 DIAGNOSIS — F411 Generalized anxiety disorder: Secondary | ICD-10-CM | POA: Diagnosis not present

## 2020-10-05 DIAGNOSIS — G4709 Other insomnia: Secondary | ICD-10-CM | POA: Diagnosis not present

## 2020-10-05 MED ORDER — PROPRANOLOL HCL 10 MG PO TABS: 10.0000 mg | ORAL_TABLET | Freq: Every day | ORAL | 1 refills | Status: DC | PRN

## 2020-10-05 MED ORDER — CITALOPRAM HYDROBROMIDE 40 MG PO TABS: 40.0000 mg | ORAL_TABLET | Freq: Every day | ORAL | 0 refills | Status: DC

## 2020-10-05 MED ORDER — TRAZODONE HCL 50 MG PO TABS
25.0000 mg | ORAL_TABLET | Freq: Every evening | ORAL | 3 refills | Status: DC | PRN
Start: 2020-10-05 — End: 2020-11-01

## 2020-10-05 NOTE — Patient Instructions (Addendum)
Increased citalopram to tabs a night, when you get refills they will be the higher dose Use Trazodone at night for sleep: start with 1/2 tab can use full tab if needed Use propranolol for anxiety during the day start with 1 tab can take a second if needed  Cerebral and Betterhelp as online options For therapy -- 1. [facility] ([address] Hulan Amato [place],Heather Joycelyn Schmid [place]) [phone] 2. [facility] Raynelle Fanning [place]) [phone] 3. [facility] - [phone] 4. [facility] - [phone] (do not file insurance) 5. [facility] - accepts most major insurances. [phone] or email frontdesk@[facility] .[email or web address]   http://APA.org/depression-guideline"> https://clinicalkey.com"> http://point-of-care.elsevierperformancemanager.com/skills/"> http://point-of-care.elsevierperformancemanager.com">  Managing Depression, Adult Depression is a mental health condition that affects your thoughts, feelings, and actions. Being diagnosed with depression can bring you relief if you did not know why you have felt or behaved a certain way. It could also leave you feeling overwhelmed with uncertainty about your future. Preparing yourself to manage your symptoms can help you feel more positive about your future. How to manage lifestyle changes Managing stress Stress is your body's reaction to life changes and events, both good and bad. Stress can add to your feelings of depression. Learning to manage your stress can help lessen your feelings of depression. Try some of the following approaches to reducing your stress (stress reduction techniques):  Listen to music that you enjoy and that inspires you.  Try using a meditation app or take a meditation class.  Develop a practice that helps you connect with your spiritual self. Walk in nature, pray, or go to a place of worship.  Do some deep  breathing. To do this, inhale slowly through your nose. Pause at the top of your inhale for a few seconds and then exhale slowly, letting your muscles relax.  Practice yoga to help relax and work your muscles. Choose a stress reduction technique that suits your lifestyle and personality. These techniques take time and practice to develop. Set aside 5-15 minutes a day to do them. Therapists can offer training in these techniques. Other things you can do to manage stress include:  Keeping a stress diary.  Knowing your limits and saying no when you think something is too much.  Paying attention to how you react to certain situations. You may not be able to control everything, but you can change your reaction.  Adding humor to your life by watching funny films or TV shows.  Making time for activities that you enjoy and that relax you.   Medicines Medicines, such as antidepressants, are often a part of treatment for depression.  Talk with your pharmacist or health care provider about all the medicines, supplements, and herbal products that you take, their possible side effects, and what medicines and other products are safe to take together.  Make sure to report any side effects you may have to your health care provider. Relationships Your health care provider may suggest family therapy, couples therapy, or individual therapy as part of your treatment. How to recognize changes Everyone responds differently to treatment for depression. As you recover from depression, you may start to:  Have more interest in doing activities.  Feel less hopeless.  Have more energy.  Overeat less often, or have a better appetite.  Have better mental focus. It is important to recognize if your depression is not getting better or is getting worse. The symptoms you had in the beginning may return, such  as:  Tiredness (fatigue) or low energy.  Eating too much or too little.  Sleeping too much or too  little.  Feeling restless, agitated, or hopeless.  Trouble focusing or making decisions.  Unexplained physical complaints.  Feeling irritable, angry, or aggressive. If you or your family members notice these symptoms coming back, let your health care provider know right away. Follow these instructions at home: Activity  Try to get some form of exercise each day, such as walking, biking, swimming, or lifting weights.  Practice stress reduction techniques.  Engage your mind by taking a class or doing some volunteer work.   Lifestyle  Get the right amount and quality of sleep.  Cut down on using caffeine, tobacco, alcohol, and other potentially harmful substances.  Eat a healthy diet that includes plenty of vegetables, fruits, whole grains, low-fat dairy products, and lean protein. Do not eat a lot of foods that are high in solid fats, added sugars, or salt (sodium). General instructions  Take over-the-counter and prescription medicines only as told by your health care provider.  Keep all follow-up visits as told by your health care provider. This is important. Where to find support Talking to others Friends and family members can be sources of support and guidance. Talk to trusted friends or family members about your condition. Explain your symptoms to them, and let them know that you are working with a health care provider to treat your depression. Tell friends and family members how they also can be helpful.   Finances  Find appropriate mental health providers that fit with your financial situation.  Talk with your health care provider about options to get reduced prices on your medicines. Where to find more information You can find support in your area from:  Anxiety and Depression Association of America (ADAA): www.adaa.org  Mental Health America: www.mentalhealthamerica.net  The [employer] on Mental Illness: www.nami.org Contact a health care provider if:  You  stop taking your antidepressant medicines, and you have any of these symptoms: ? Nausea. ? Headache. ? Light-headedness. ? Chills and body aches. ? Not being able to sleep (insomnia).  You or your friends and family think your depression is getting worse. Get help right away if:  You have thoughts of hurting yourself or others. If you ever feel like you may hurt yourself or others, or have thoughts about taking your own life, get help right away. Go to your nearest emergency department or:  Call your local emergency services (911 in the [place]  Call a suicide crisis helpline, such as the National Suicide Prevention Lifeline at 703-687-6860. This is open 24 hours a day in the [place]  Text the Crisis Text Line at 418 222 7318 (in the [place] Summary  If you are diagnosed with depression, preparing yourself to manage your symptoms is a good way to feel positive about your future.  Work with your health care provider on a management plan that includes stress reduction techniques, medicines (if applicable), therapy, and healthy lifestyle habits.  Keep talking with your health care provider about how your treatment is working.  If you have thoughts about taking your own life, call a suicide crisis helpline or text a crisis text line. This information is not intended to replace advice given to you by your health care provider. Make sure you discuss any questions you have with your health care provider. Document Revised: 06/25/2019 Document Reviewed: 06/25/2019 Elsevier Patient Education  2021 [employer].   If you have lab work done today  you will be contacted with your lab results within the next 2 weeks.  If you have not heard from [place] then please contact us. The fastest way to get your results is to register for My Chart.   IF you received an x-ray today, you will receive an invoice from [facility]. Please contact [facility] at [phone] with questions or concerns  regarding your invoice.   IF you received labwork today, you will receive an invoice from [facility]. Please contact [facility] at [phone] with questions or concerns regarding your invoice.   Our billing staff will not be able to assist you with questions regarding bills from these companies.  You will be contacted with the lab results as soon as they are available. The fastest way to get your results is to activate your My Chart account. Instructions are located on the last page of this paperwork. If you have not heard from [place] regarding the results in 2 weeks, please contact this office.
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
## 2020-11-01 ENCOUNTER — Telehealth (INDEPENDENT_AMBULATORY_CARE_PROVIDER_SITE_OTHER): Payer: Medicare Other | Admitting: Family Medicine

## 2020-11-01 ENCOUNTER — Encounter: Payer: Self-pay | Admitting: Family Medicine

## 2020-11-01 DIAGNOSIS — F32A Depression, unspecified: Secondary | ICD-10-CM | POA: Diagnosis not present

## 2020-11-01 DIAGNOSIS — F411 Generalized anxiety disorder: Secondary | ICD-10-CM

## 2020-11-01 DIAGNOSIS — G4709 Other insomnia: Secondary | ICD-10-CM | POA: Diagnosis not present

## 2020-11-01 MED ORDER — CITALOPRAM HYDROBROMIDE 40 MG PO TABS: 40.0000 mg | ORAL_TABLET | Freq: Every day | ORAL | 0 refills | Status: DC

## 2020-11-01 MED ORDER — TRAZODONE HCL 50 MG PO TABS: 50.0000 mg | ORAL_TABLET | Freq: Every evening | ORAL | 3 refills | Status: DC | PRN

## 2020-11-01 NOTE — Progress Notes (Signed)
Virtual Visit Note  I connected with patient on 11/01/20 at 1147 by telephone due to unable to work Epic video visit and verified that I am speaking with the correct person using two identifiers. [name] is currently located at home and no family members are currently with them during visit. The provider, Lori Course Baelynn Schmuhl, FNP is located in their office at time of visit.  I discussed the limitations, risks, security and privacy concerns of performing an evaluation and management service by telephone and the availability of in person appointments. I also discussed with the patient that there may be a patient responsible charge related to this service. The patient expressed understanding and agreed to proceed.   I provided 20 minutes of non-face-to-face time during this encounter.  Chief Complaint  Patient presents with  . Depression    Doing well  citalopram and propanolol, however not sleeping through the night w/ trazadone     HPI ? Is currently in the hospital with her daughter who is about to give birth Overall feels much better Last Ov 2/8 PHQ-20 at that OV Today PHQ-0 Started Celexa on 12/29 Has tried paxil and buspirone   Was able to get a counselor next appointment on the 7th  Last OV increased Celexa from 20 to 40 This is working much better Started Trazodone for insomnia Doesn't feel like this is working very well Propranolol started for anxiety Takes as needed  Legal and relationship issues are resolving  Depression screen [facility] 2/9 11/01/2020 10/05/2020 08/25/2020 05/10/2020 12/08/2019  Decreased Interest 0 2 2 0 0  Down, Depressed, Hopeless 0 3 3 0 0  PHQ - 2 Score 0 5 5 0 0  Altered sleeping 0 3 2 - -  Tired, decreased energy 0 2 2 - -  Change in appetite 0 2 1 - -  Feeling bad or failure about yourself  0 2 2 - -  Trouble concentrating 0 3 1 - -  Moving slowly or fidgety/restless 0 2 1 - -  Suicidal thoughts 0 1 1 - -  PHQ-9 Score 0 20 15 - -   Difficult doing work/chores Not difficult at all Somewhat difficult Very difficult - -  Some encounter information is confidential and restricted. Go to Review Flowsheets activity to see all data.  Some recent data might be hidden     Allergies  Allergen Reactions  . Buspirone Nausea Only  . Paxil  [Paroxetine Hcl] Nausea Only    Prior to Admission medications   Medication Sig Start Date End Date Taking? Authorizing Provider  citalopram (CELEXA) 40 MG tablet Take 1 tablet (40 mg total) by mouth daily. 10/05/20 01/03/21 Yes Delbert Vu, Lori Course, FNP  cycloSPORINE (RESTASIS) 0.05 % ophthalmic emulsion 1 drop 2 (two) times daily.   Yes [provider]  HYDROcodone-acetaminophen (NORCO/VICODIN) 5-325 MG tablet Take 1 tablet by mouth every 6 (six) hours as needed. 05/10/20  Yes Lezlie Lye, Meda Coffee, MD  ibuprofen (ADVIL) 600 MG tablet as needed. 12/09/19  Yes [provider]  Multiple Vitamins-Minerals (ONE-A-DAY VITACRAVES ADULT) CHEW Chew 1 each by mouth daily.   Yes [provider]  predniSONE (DELTASONE) 10 MG tablet Take 1 tablet (10 mg total) by mouth daily with breakfast. 10/31/19  Yes Lezlie Lye, Meda Coffee, MD  propranolol (INDERAL) 10 MG tablet Take 1-2 tablets (10-20 mg total) by mouth daily as needed (anxiety). 10/05/20  Yes Loella Hickle, Lori Course, FNP  riTUXimab (RITUXAN) 500 MG/50ML injection Inject 1,000  mg into the vein every 6 (six) months.    Yes [provider]  traZODone (DESYREL) 50 MG tablet Take 0.5-1 tablets (25-50 mg total) by mouth at bedtime as needed for sleep. 10/05/20  Yes Davied Nocito, Lori Course, FNP  valACYclovir (VALTREX) 500 MG tablet Take 1 tablet (500 mg total) by mouth daily. for 3 days at onset of symptoms related with an outbreak 10/31/19  Yes Lezlie Lye, Meda Coffee, MD  fexofenadine (ALLEGRA) 180 MG tablet Take 180 mg by mouth daily. Patient not taking: Reported on 11/01/2020    [provider]    Past Medical History:  Diagnosis Date  . Anxiety    . Hyperlipidemia   . HYPERTENSION, BENIGN ESSENTIAL 06/16/2008   Qualifier: Diagnosis of  By: Daphine Deutscher FNP, Zena Amos    . Polymyositis (HCC) 2009  . RLS (restless legs syndrome)   . Vitamin D deficiency     Past Surgical History:  Procedure Laterality Date  . BRAIN SURGERY      Social History   Tobacco Use  . Smoking status: Never Smoker  . Smokeless tobacco: Never Used  Substance Use Topics  . Alcohol use: No    Family History  Problem Relation Age of Onset  . Hepatitis Mother   . Diabetes Paternal Uncle   . Diabetes Maternal Grandmother   . Heart disease Paternal Grandfather   . Breast cancer Maternal Aunt        ? age of onset    Review of Systems  Constitutional: Negative for malaise/fatigue.  Eyes: Negative for blurred vision and double vision.  Respiratory: Negative for cough and shortness of breath.   Cardiovascular: Negative for chest pain and palpitations.  Musculoskeletal: Negative for myalgias.  Neurological: Negative for dizziness and headaches.  Psychiatric/Behavioral: Negative for depression and suicidal ideas. The patient has insomnia. The patient is not nervous/anxious.     Objective  Constitutional:      General: Not in acute distress.    Appearance: Normal appearance. Not ill-appearing.   Pulmonary:     Effort: Pulmonary effort is normal. No respiratory distress.  Neurological:     Mental Status: Alert and oriented to person, place, and time.  Psychiatric:        Mood and Affect: Mood normal.        Behavior: Behavior normal.     ASSESSMENT and PLAN  Problem List Items Addressed This Visit   None   Visit Diagnoses    Generalized anxiety disorder    -  Primary   Relevant Medications   traZODone (DESYREL) 50 MG tablet   citalopram (CELEXA) 40 MG tablet   Other insomnia       Relevant Medications   traZODone (DESYREL) 50 MG tablet   Depression, unspecified depression type       Relevant Medications   traZODone (DESYREL) 50 MG  tablet   citalopram (CELEXA) 40 MG tablet      Plan . Continue celexa at current dose . Increase trazodone to 50-100 for insomnia . Continue propranolol as needed for anxiety . Safety numbers provided  Return if symptoms worsen or fail to improve, for Next scheduled visit.    The above assessment and management plan was discussed with the patient. The patient verbalized understanding of and has agreed to the management plan. Patient is aware to call the clinic if symptoms persist or worsen. Patient is aware when to return to the clinic for a follow-up visit. Patient educated on when it is appropriate to  go to the emergency department.     Macario Carls Adamae Ricklefs, FNP-BC Primary Care at [facility] [address] [place], [place] [address] Ph.  [phone] Fax [phone]

## 2020-11-01 NOTE — Patient Instructions (Signed)

## 2020-11-03 ENCOUNTER — Other Ambulatory Visit: Payer: Self-pay | Admitting: Family Medicine

## 2020-11-03 MED ORDER — IBUPROFEN 600 MG PO TABS: 600.0000 mg | ORAL_TABLET | ORAL | 2 refills | Status: DC | PRN

## 2020-11-03 NOTE — Telephone Encounter (Signed)
11/03/2020 - PATIENT IS REQUESTING A REFILL ON THE IBUPROFEN 600 mg. SHE SAID IT WAS LAST FILLED BY DR. Darcel Bayley ON 06/30/2020. SHE SAID SHE TAKES IT FOR HER KNEE, BACK, AND HIP PAIN BECAUSE IT DOES NOT MAKE HER SLEEPY. PLEASE CALL HER WHEN IT HAS BEEN DONE. BEST PHONE [phone] (CELL) MBC

## 2020-11-09 ENCOUNTER — Other Ambulatory Visit: Payer: Self-pay | Admitting: Family Medicine

## 2020-11-16 DIAGNOSIS — H5213 Myopia, bilateral: Secondary | ICD-10-CM | POA: Diagnosis not present

## 2020-11-17 ENCOUNTER — Other Ambulatory Visit: Payer: Self-pay

## 2020-11-17 ENCOUNTER — Ambulatory Visit (HOSPITAL_COMMUNITY): Payer: Medicare Other | Admitting: Licensed Clinical Social Worker

## 2020-11-17 DIAGNOSIS — F321 Major depressive disorder, single episode, moderate: Secondary | ICD-10-CM

## 2020-11-17 DIAGNOSIS — F3181 Bipolar II disorder: Secondary | ICD-10-CM
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
## 2020-11-23 ENCOUNTER — Other Ambulatory Visit: Payer: Self-pay | Admitting: Internal Medicine

## 2020-11-23 DIAGNOSIS — Z1231 Encounter for screening mammogram for malignant neoplasm of breast: Secondary | ICD-10-CM

## 2020-11-25 ENCOUNTER — Encounter: Payer: Medicare Other | Admitting: Family Medicine

## 2020-12-01 ENCOUNTER — Ambulatory Visit (HOSPITAL_COMMUNITY): Payer: Medicare Other | Admitting: Licensed Clinical Social Worker

## 2020-12-01 ENCOUNTER — Other Ambulatory Visit: Payer: Self-pay

## 2020-12-01 ENCOUNTER — Telehealth (HOSPITAL_COMMUNITY): Payer: Self-pay | Admitting: Licensed Clinical Social Worker

## 2020-12-01 NOTE — Telephone Encounter (Signed)
LCSW sent two links to phone listed in epic with no response. LCSW f/u with PC at 1510 and left HIPAA compliant VM. LCSW waited until 1515 before disconnecting virtual link.

## 2020-12-04 ENCOUNTER — Other Ambulatory Visit: Payer: Self-pay | Admitting: Family Medicine

## 2020-12-04 DIAGNOSIS — F411 Generalized anxiety disorder: Secondary | ICD-10-CM

## 2020-12-09 DIAGNOSIS — Z9225 Personal history of immunosupression therapy: Secondary | ICD-10-CM | POA: Diagnosis not present

## 2020-12-09 DIAGNOSIS — Z7952 Long term (current) use of systemic steroids: Secondary | ICD-10-CM | POA: Diagnosis not present

## 2020-12-09 DIAGNOSIS — Z0184 Encounter for antibody response examination: Secondary | ICD-10-CM | POA: Diagnosis not present

## 2020-12-09 DIAGNOSIS — Z5181 Encounter for therapeutic drug level monitoring: Secondary | ICD-10-CM | POA: Diagnosis not present

## 2020-12-09 DIAGNOSIS — Z79899 Other long term (current) drug therapy: Secondary | ICD-10-CM | POA: Diagnosis not present

## 2020-12-09 DIAGNOSIS — M3320 Polymyositis, organ involvement unspecified: Secondary | ICD-10-CM | POA: Diagnosis not present

## 2020-12-13 ENCOUNTER — Other Ambulatory Visit: Payer: Self-pay | Admitting: Family Medicine

## 2020-12-13 DIAGNOSIS — F32A Depression, unspecified: Secondary | ICD-10-CM

## 2020-12-15 DIAGNOSIS — Z9225 Personal history of immunosupression therapy: Secondary | ICD-10-CM | POA: Diagnosis not present

## 2020-12-15 DIAGNOSIS — M8588 Other specified disorders of bone density and structure, other site: Secondary | ICD-10-CM | POA: Diagnosis not present

## 2020-12-27 ENCOUNTER — Ambulatory Visit (INDEPENDENT_AMBULATORY_CARE_PROVIDER_SITE_OTHER): Payer: Medicare Other | Admitting: Podiatry

## 2020-12-27 ENCOUNTER — Other Ambulatory Visit: Payer: Self-pay

## 2020-12-27 DIAGNOSIS — B353 Tinea pedis: Secondary | ICD-10-CM | POA: Diagnosis not present

## 2020-12-27 MED ORDER — CLOTRIMAZOLE-BETAMETHASONE 1-0.05 % EX CREA: 1.0000 "application " | TOPICAL_CREAM | Freq: Two times a day (BID) | CUTANEOUS | 2 refills | Status: DC

## 2020-12-27 MED ORDER — TERBINAFINE HCL 250 MG PO TABS: 250.0000 mg | ORAL_TABLET | Freq: Every day | ORAL | 0 refills | Status: DC

## 2020-12-27 NOTE — Progress Notes (Signed)
   HPI: 45 y.o. female presenting today presenting today for reoccurrence of likely tinea pedis to the left foot.  Patient states is very itchy and it has been present for the last 4-6 months.  She had the same issue approximately 2 years ago and it was treated with oral Lamisil and Lotrisone cream and it resolved her symptoms.  She presents for further treatment evaluation.  Past Medical History:  Diagnosis Date  . Anxiety   . Hyperlipidemia   . HYPERTENSION, BENIGN ESSENTIAL 06/16/2008   Qualifier: Diagnosis of  By: Daphine Deutscher FNP, Zena Amos    . Polymyositis (HCC) 2009  . RLS (restless legs syndrome)   . Vitamin D deficiency      Physical Exam: General: The patient is alert and oriented x3 in no acute distress.  Dermatology: Skin is warm, dry and supple bilateral lower extremities. Negative for open lesions or macerations.  There is some hyperkeratosis of the skin with pruritus noted to the left foot  Vascular: Palpable pedal pulses bilaterally. No edema or erythema noted. Capillary refill within normal limits.  Neurological: Epicritic and protective threshold grossly intact bilaterally.   Musculoskeletal Exam: Range of motion within normal limits to all pedal and ankle joints bilateral. Muscle strength 5/5 in all groups bilateral.   Assessment: 1.  Tinea pedis left foot   Plan of Care:  1. Patient evaluated.  2.  Last time the patient was in the office in 2020 she responded very well to 4 weeks of oral Lamisil and Lotrisone cream.  Today we will pursue the same treatment 3.  Prescription for Lamisil 250 mg #30 daily.  Patient denies a history of liver pathology or symptoms. 4.  Prescription for Lotrisone cream apply 2 times daily 5.  Return to clinic as needed      Felecia Shelling, DPM [facility]  Dr. Felecia Shelling, DPM    [address] [place], [place] [address]                Office [phone]  Fax [phone]

## 2021-01-06 ENCOUNTER — Ambulatory Visit (INDEPENDENT_AMBULATORY_CARE_PROVIDER_SITE_OTHER): Payer: Medicare Other | Admitting: Licensed Clinical Social Worker

## 2021-01-06 ENCOUNTER — Other Ambulatory Visit: Payer: Self-pay

## 2021-01-06 DIAGNOSIS — F411 Generalized anxiety disorder: Secondary | ICD-10-CM | POA: Insufficient documentation

## 2021-01-06 DIAGNOSIS — F320 Major depressive disorder, single episode, mild: Secondary | ICD-10-CM

## 2021-01-06 DIAGNOSIS — F32A Depression, unspecified: Secondary | ICD-10-CM

## 2021-01-06 DIAGNOSIS — F325 Major depressive disorder, single episode, in full remission: Secondary | ICD-10-CM | POA: Insufficient documentation

## 2021-01-06 DIAGNOSIS — F339 Major depressive disorder, recurrent, unspecified: Secondary | ICD-10-CM | POA: Insufficient documentation
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
## 2021-01-28 ENCOUNTER — Ambulatory Visit: Payer: Medicare Other | Admitting: Family Medicine

## 2021-02-14 ENCOUNTER — Ambulatory Visit: Payer: Medicare Other

## 2021-02-17 ENCOUNTER — Ambulatory Visit (INDEPENDENT_AMBULATORY_CARE_PROVIDER_SITE_OTHER): Payer: Medicare Other | Admitting: Licensed Clinical Social Worker

## 2021-02-17 ENCOUNTER — Other Ambulatory Visit: Payer: Self-pay

## 2021-02-17 DIAGNOSIS — F411 Generalized anxiety disorder: Secondary | ICD-10-CM

## 2021-02-17 NOTE — Progress Notes (Signed)
   THERAPIST PROGRESS NOTE  Session Time: 28  Participation Level: Active  Behavioral Response: CasualAlertAnxious  Type of Therapy: Individual Therapy  Treatment Goals addressed: Diagnosis: Depression   Interventions: Supportive  Summary: [name] is a 45 y.o. female who presents with MDD and GAD .   Suicidal/Homicidal: NAwithout intent/plan  Therapist Response:    Virtual Visit via Video Note  I connected with Fritzi R Brener on 02/17/21 at  3:00 PM EDT by a video enabled telemedicine application and verified that I am speaking with the correct person using two identifiers.  Location: Patient: [facility]  Provider: Provider Home    I discussed the limitations of evaluation and management by telemedicine and the availability of in person appointments. The patient expressed understanding and agreed to proceed.     Subjective/Objective: Pt was alert and oriented x 5. She was dressed casually and engaged well in therapy. T presented with an anxious mood/affect. He was cooperative and maintained good eye contact.   Pt continue to show good progress in therapy. As discuss in last session with pt that if PHQ and GAD remained below a 5 a discharge from therapy could occur. Pt scored a 0 on PHQ-9 and a 2 on GAD-7. LCSW and pt discuss discharge and how therapy could always be reevaluated with a new assessment. Kissa was agreeable to plan moving forward. Resources were provided for pt to [employer] for Support groups.    Assessment: Pt states an improvement with depression and anxiety symptoms. She reports only symptoms she still has is restlessness about half the days of the week. She does meet criteria for GAD. Pt will discharge from therapy with resources   I discussed the assessment and treatment plan with the patient. The patient was provided an opportunity to ask questions and all were answered. The patient agreed with the plan and demonstrated an understanding  of the instructions.   The patient was advised to call back or seek an in-person evaluation if the symptoms worsen or if the condition fails to improve as anticipated.  I provided 30  minutes of non-face-to-face time during this encounter.   Weber Cooks, LCSW  Plan: Discharge pt from therapy.      Weber Cooks, LCSW 02/17/2021

## 2021-03-30 ENCOUNTER — Ambulatory Visit: Payer: Medicare Other | Admitting: Nurse Practitioner

## 2021-04-05 ENCOUNTER — Ambulatory Visit: Payer: Medicare Other

## 2021-04-08 ENCOUNTER — Ambulatory Visit: Payer: Medicare Other | Admitting: Internal Medicine

## 2021-04-12 ENCOUNTER — Ambulatory Visit: Payer: Medicare Other

## 2021-05-05 ENCOUNTER — Other Ambulatory Visit: Payer: Self-pay

## 2021-05-06 ENCOUNTER — Encounter: Payer: Self-pay | Admitting: Nurse Practitioner

## 2021-05-06 ENCOUNTER — Ambulatory Visit (INDEPENDENT_AMBULATORY_CARE_PROVIDER_SITE_OTHER): Payer: Medicare Other | Admitting: Nurse Practitioner

## 2021-05-06 VITALS — BP 100/70 | HR 72 | Temp 97.2°F | Ht 69.0 in | Wt 171.6 lb

## 2021-05-06 DIAGNOSIS — F325 Major depressive disorder, single episode, in full remission: Secondary | ICD-10-CM | POA: Diagnosis not present

## 2021-05-06 DIAGNOSIS — E559 Vitamin D deficiency, unspecified: Secondary | ICD-10-CM

## 2021-05-06 DIAGNOSIS — A6000 Herpesviral infection of urogenital system, unspecified: Secondary | ICD-10-CM

## 2021-05-06 DIAGNOSIS — N946 Dysmenorrhea, unspecified: Secondary | ICD-10-CM

## 2021-05-06 DIAGNOSIS — K219 Gastro-esophageal reflux disease without esophagitis: Secondary | ICD-10-CM | POA: Insufficient documentation

## 2021-05-06 DIAGNOSIS — E7800 Pure hypercholesterolemia, unspecified: Secondary | ICD-10-CM | POA: Diagnosis not present

## 2021-05-06 DIAGNOSIS — M816 Localized osteoporosis [Lequesne]: Secondary | ICD-10-CM | POA: Diagnosis not present

## 2021-05-06 DIAGNOSIS — M3320 Polymyositis, organ involvement unspecified: Secondary | ICD-10-CM

## 2021-05-06 LAB — LIPID PANEL
Cholesterol: 239 mg/dL — ABNORMAL HIGH (ref 0–200)
HDL: 44.8 mg/dL (ref >39.00)
LDL Cholesterol: 179 mg/dL — ABNORMAL HIGH (ref 0–99)
NonHDL: 194.14
Total CHOL/HDL Ratio: 5
Triglycerides: 75 mg/dL (ref 0.0–149.0)
VLDL: 15 mg/dL (ref 0.0–40.0)

## 2021-05-06 LAB — COMPREHENSIVE METABOLIC PANEL
ALT: 17 U/L (ref 0–35)
AST: 17 U/L (ref 0–37)
Albumin: 4.1 g/dL (ref 3.5–5.2)
Alkaline Phosphatase: 76 U/L (ref 39–117)
BUN: 8 mg/dL (ref 6–23)
CO2: 22 mEq/L (ref 19–32)
Calcium: 9 mg/dL (ref 8.4–10.5)
Chloride: 107 mEq/L (ref 96–112)
Creatinine, Ser: 0.56 mg/dL (ref 0.40–1.20)
GFR: 111.84 mL/min (ref >60.00)
Glucose, Bld: 73 mg/dL (ref 70–99)
Potassium: 3.9 mEq/L (ref 3.5–5.1)
Sodium: 139 mEq/L (ref 135–145)
Total Bilirubin: 0.3 mg/dL (ref 0.2–1.2)
Total Protein: 7.2 g/dL (ref 6.0–8.3)

## 2021-05-06 LAB — TSH: TSH: 1.12 u[IU]/mL (ref 0.35–5.50)

## 2021-05-06 LAB — CBC
HCT: 43.3 % (ref 36.0–46.0)
Hemoglobin: 14.5 g/dL (ref 12.0–15.0)
MCHC: 33.5 g/dL (ref 30.0–36.0)
MCV: 94.1 fl (ref 78.0–100.0)
Platelets: 334 10*3/uL (ref 150.0–400.0)
RBC: 4.6 Mil/uL (ref 3.87–5.11)
RDW: 14.3 % (ref 11.5–15.5)
WBC: 7 10*3/uL (ref 4.0–10.5)

## 2021-05-06 MED ORDER — IBUPROFEN 600 MG PO TABS: 600.0000 mg | ORAL_TABLET | Freq: Two times a day (BID) | ORAL | 2 refills | Status: DC | PRN

## 2021-05-06 MED ORDER — CITALOPRAM HYDROBROMIDE 20 MG PO TABS: 20.0000 mg | ORAL_TABLET | Freq: Every day | ORAL | 3 refills | Status: DC

## 2021-05-06 NOTE — Patient Instructions (Signed)
Thank you for choosing [facility]  Go to lab for blood day

## 2021-05-06 NOTE — Assessment & Plan Note (Signed)
Onset 2009 Managed by Dr. Erroll Luna with [facility]. Current use of oral prednisone, rituxan, and hydrocodone.(prescribed by Dr. Alphonzo Dublin per patient)

## 2021-05-06 NOTE — Assessment & Plan Note (Signed)
No outbreak at this time Uses valtrex prn

## 2021-05-06 NOTE — Assessment & Plan Note (Signed)
Repeat vit. D °

## 2021-05-06 NOTE — Assessment & Plan Note (Signed)
Stable mood with celexa 20mg . Uses propanolol prn for acute anxiety, but has not needed this for several weeks. Uses trazodone prn for insomnia, but has not need this for several weeks.  Sent celexa refill

## 2021-05-06 NOTE — Assessment & Plan Note (Signed)
Due to prolong systemic corticosteroid for treat polymyositis. No fracture High fall risk due to LE muscle weakness and unsteady gait. Current use of cane during ambulation. She denies need for PT and walker at this time. We discussed fall prevention methods. She verbalized understanding

## 2021-05-06 NOTE — Progress Notes (Signed)
Subjective:  Patient ID: [name], female    DOB: 09-25-1975  Age: 45 y.o. MRN: [id number]  CC: Establish Care (New patient/Medication refills needed (Ibuprofen and Celexa)/No vaccines today. )  HPI Vitamin D deficiency Repeat vit. D  Osteoporosis Potter to prolong systemic corticosteroid for treat polymyositis. No fracture High fall risk Potter to LE muscle weakness and unsteady gait. Current use of cane during ambulation. She denies need for PT and walker at this time. We discussed fall prevention methods. She verbalized understanding  Polymyositis (HCC) Onset 2009 Managed by Dr. Erroll Luna with [facility]. Current use of oral prednisone, rituxan, and hydrocodone.(prescribed by Dr. Alphonzo Dublin per patient)  Depression, major, single episode, complete remission (HCC) Stable mood with celexa 20mg . Uses propanolol prn for acute anxiety, but has not needed this for several weeks. Uses trazodone prn for insomnia, but has not need this for several weeks.  Sent celexa refill  Genital herpes simplex type 2 No outbreak at this time Uses valtrex prn  Ibuprofen for dysmenorrhea 600mg  BIDprn for pain.  Depression screen [facility] 2/9 05/06/2021 05/06/2021 11/01/2020  Decreased Interest 0 0 0  Down, Depressed, Hopeless 0 0 0  PHQ - 2 Score 0 0 0  Altered sleeping - - 0  Tired, decreased energy - - 0  Change in appetite - - 0  Feeling bad or failure about yourself  - - 0  Trouble concentrating - - 0  Moving slowly or fidgety/restless - - 0  Suicidal thoughts - - 0  PHQ-9 Score - - 0  Difficult doing work/chores - - Not difficult at all  Some encounter information is confidential and restricted. Go to Review Flowsheets activity to see all data.  Some recent data might be hidden    Reviewed past Medical, Social and Family history today.  Outpatient Medications Prior to Visit  Medication Sig Dispense Refill   cycloSPORINE (RESTASIS) 0.05 % ophthalmic emulsion 1 drop 2  (two) times daily.     HYDROcodone-acetaminophen (NORCO/VICODIN) 5-325 MG tablet Take 1 tablet by mouth every 6 (six) hours as needed. 60 tablet 0   Multiple Vitamins-Minerals (ONE-A-DAY VITACRAVES ADULT) CHEW Chew 1 each by mouth daily.     predniSONE (DELTASONE) 10 MG tablet Take 1 tablet (10 mg total) by mouth daily with breakfast.     propranolol (INDERAL) 10 MG tablet TAKE 1-2 TABLETS (10-20 MG TOTAL) BY MOUTH DAILY AS NEEDED (ANXIETY). 60 tablet 1   riTUXimab (RITUXAN) 500 MG/50ML injection Inject 1,000 mg into the vein every 6 (six) months.      traZODone (DESYREL) 50 MG tablet Take 1-2 tablets (50-100 mg total) by mouth at bedtime as needed for sleep. 30 tablet 3   valACYclovir (VALTREX) 500 MG tablet Take 1 tablet (500 mg total) by mouth daily. for 3 days at onset of symptoms related with an outbreak 90 tablet 3   ibuprofen (ADVIL) 600 MG tablet Take 1 tablet (600 mg total) by mouth as needed. 30 tablet 2   citalopram (CELEXA) 40 MG tablet Take 1 tablet (40 mg total) by mouth daily. 90 tablet 0   clotrimazole-betamethasone (LOTRISONE) cream Apply 1 application topically 2 (two) times daily. (Patient not taking: Reported on 05/06/2021) 45 g 2   fexofenadine (ALLEGRA) 180 MG tablet Take 180 mg by mouth daily. (Patient not taking: Reported on 05/06/2021)     terbinafine (LAMISIL) 250 MG tablet Take 1 tablet (250 mg total) by mouth daily. (Patient not taking: Reported on 05/06/2021) 30 tablet  0   No facility-administered medications prior to visit.    ROS See HPI  Objective:  BP 100/70 (BP Location: Left Arm, Patient Position: Sitting, Cuff Size: Normal)   Pulse 72   Temp (!) 97.2 F (36.2 C) (Temporal)   Ht 5\' 9"  (1.753 m)   Wt 171 lb 9.6 oz (77.8 kg)   SpO2 99%   BMI 25.34 kg/m   Physical Exam Cardiovascular:     Rate and Rhythm: Normal rate and regular rhythm.     Pulses: Normal pulses.     Heart sounds: Normal heart sounds.  Pulmonary:     Effort: Pulmonary effort is normal.      Breath sounds: Normal breath sounds.  Musculoskeletal:     Right lower leg: No edema.     Left lower leg: No edema.     Comments: Ambulates with cane  Neurological:     Mental Status: She is alert and oriented to person, place, and time.  Psychiatric:        Mood and Affect: Mood normal.        Behavior: Behavior normal.        Thought Content: Thought content normal.    Assessment & Plan:  This visit occurred during the SARS-CoV-2 public health emergency.  Safety protocols were in place, including screening questions prior to the visit, additional usage of staff PPE, and extensive cleaning of exam room while observing appropriate contact time as indicated for disinfecting solutions.   Yana was seen today for establish care.  Diagnoses and all orders for this visit:  HYPERCHOLESTEROLEMIA -     Comprehensive metabolic panel -     Lipid panel  Vitamin D deficiency -     Vitamin D 1,25 dihydroxy  Localized osteoporosis without current pathological fracture -     Vitamin D 1,25 dihydroxy -     Comprehensive metabolic panel  Dysmenorrhea -     ibuprofen (ADVIL) 600 MG tablet; Take 1 tablet (600 mg total) by mouth every 12 (twelve) hours as needed (with food).  Polymyositis (HCC)  Depression, major, single episode, complete remission (HCC) -     citalopram (CELEXA) 20 MG tablet; Take 1 tablet (20 mg total) by mouth daily. -     CBC -     TSH -     Comprehensive metabolic panel  Genital herpes simplex type 2   Problem List Items Addressed This Visit       Musculoskeletal and Integument   Osteoporosis    Potter to prolong systemic corticosteroid for treat polymyositis. No fracture High fall risk Potter to LE muscle weakness and unsteady gait. Current use of cane during ambulation. She denies need for PT and walker at this time. We discussed fall prevention methods. She verbalized understanding      Relevant Orders   Vitamin D 1,25 dihydroxy   Comprehensive  metabolic panel   Polymyositis (HCC)    Onset 2009 Managed by Dr. 2010 with [facility]. Current use of oral prednisone, rituxan, and hydrocodone.(prescribed by Dr. [facility] per patient)        Genitourinary   Genital herpes simplex type 2    No outbreak at this time Uses valtrex prn        Other   Depression, major, single episode, complete remission (HCC)    Stable mood with celexa 20mg . Uses propanolol prn for acute anxiety, but has not needed this for several weeks. Uses trazodone prn for insomnia, but has not  need this for several weeks.  Sent celexa refill      Relevant Medications   citalopram (CELEXA) 20 MG tablet   Other Relevant Orders   CBC   TSH   Comprehensive metabolic panel   HYPERCHOLESTEROLEMIA - Primary   Relevant Orders   Comprehensive metabolic panel   Lipid panel   Vitamin D deficiency    Repeat vit. D      Relevant Orders   Vitamin D 1,25 dihydroxy   Other Visit Diagnoses     Dysmenorrhea       Relevant Medications   ibuprofen (ADVIL) 600 MG tablet       Follow-up: Return in about 6 months (around 11/03/2021) for anxiety and depression.  Alysia Penna, NP

## 2021-05-09 ENCOUNTER — Ambulatory Visit: Payer: Medicare Other

## 2021-05-10 LAB — VITAMIN D 1,25 DIHYDROXY
Vitamin D 1, 25 (OH)2 Total: 39 pg/mL (ref 18–72)
Vitamin D2 1, 25 (OH)2: <8 pg/mL
Vitamin D3 1, 25 (OH)2: 39 pg/mL

## 2021-05-18 ENCOUNTER — Encounter: Payer: Self-pay | Admitting: Nurse Practitioner

## 2021-06-07 ENCOUNTER — Other Ambulatory Visit: Payer: Self-pay

## 2021-06-07 ENCOUNTER — Ambulatory Visit
Admission: RE | Admit: 2021-06-07 | Discharge: 2021-06-07 | Disposition: A | Discharge status: Home / Self Care | Payer: Medicare Other | Source: Ambulatory Visit | Attending: Internal Medicine | Admitting: Internal Medicine

## 2021-06-07 DIAGNOSIS — Z1231 Encounter for screening mammogram for malignant neoplasm of breast: Secondary | ICD-10-CM

## 2021-08-09 ENCOUNTER — Other Ambulatory Visit: Payer: Self-pay | Admitting: Nurse Practitioner

## 2021-08-09 DIAGNOSIS — N946 Dysmenorrhea, unspecified: Secondary | ICD-10-CM

## 2021-08-11 NOTE — Telephone Encounter (Signed)
Chart supports Rx Last seen 04/2021 Next OV 11/03/2021

## 2021-09-09 ENCOUNTER — Telehealth: Payer: Self-pay | Admitting: Nurse Practitioner

## 2021-09-09 NOTE — Telephone Encounter (Signed)
Left message for patient to call back and schedule Medicare Annual Wellness Visit (AWV) in office.  ° °If not able to come in office, please offer to do virtually or by telephone.  Left office number and my jabber #[phone]. ° °Due for AWVI ° °Please schedule at anytime with Nurse Health Advisor. °  °

## 2021-09-15 ENCOUNTER — Encounter: Payer: Self-pay | Admitting: Family Medicine

## 2021-09-15 ENCOUNTER — Telehealth (INDEPENDENT_AMBULATORY_CARE_PROVIDER_SITE_OTHER): Payer: Medicare Other | Admitting: Family Medicine

## 2021-09-15 VITALS — Temp 98.7°F | Wt 173.0 lb

## 2021-09-15 DIAGNOSIS — R059 Cough, unspecified: Secondary | ICD-10-CM | POA: Diagnosis not present

## 2021-09-15 DIAGNOSIS — R0981 Nasal congestion: Secondary | ICD-10-CM | POA: Diagnosis not present

## 2021-09-15 MED ORDER — DOXYCYCLINE HYCLATE 100 MG PO TABS: 100.0000 mg | ORAL_TABLET | Freq: Two times a day (BID) | ORAL | 0 refills | Status: DC

## 2021-09-15 MED ORDER — BENZONATATE 100 MG PO CAPS: ORAL_CAPSULE | ORAL | 0 refills | Status: DC

## 2021-09-15 NOTE — Patient Instructions (Signed)
-  nasal saline twice daily  -I sent the medication(s) we discussed to your pharmacy: Meds ordered this encounter  Medications   benzonatate (TESSALON PERLES) 100 MG capsule    Sig: 1-2 capsules up to twice daily as needed for cough    Dispense:  30 capsule    Refill:  0   doxycycline (VIBRA-TABS) 100 MG tablet    Sig: Take 1 tablet (100 mg total) by mouth 2 (two) times daily.    Dispense:  14 tablet    Refill:  0     I hope you are feeling better soon!  Seek in person care promptly if your symptoms worsen, new concerns arise or you are not improving with treatment.  It was nice to meet you today. I help [facility] out with telemedicine visits on Tuesdays and Thursdays and am happy to help if you need a virtual follow up visit on those days. Otherwise, if you have any concerns or questions following this visit please schedule a follow up visit with your Primary Care office or seek care at a local urgent care clinic to avoid delays in care

## 2021-09-15 NOTE — Progress Notes (Signed)
Virtual Visit via Video Note  I connected with [name]  on 09/15/21 at  3:20 PM EST by a video enabled telemedicine application and verified that I am speaking with the correct person using two identifiers.  Location patient: [place] Location provider:work or home office Persons participating in the virtual visit: patient, provider  I discussed the limitations and requested verbal permission for telemedicine visit. The patient expressed understanding and agreed to proceed.   HPI:  Acute telemedicine visit for a cough: -Onset:over a week ago -covid testing negative -Symptoms include: nasal congestion, cough, sinus issues, had a fever last week - now resolved, but reports now feels cough is worse and feels is coughing up thick mucus and still has sinus congestion and L ear feels full -Denies: CP, SOB, NVD, body aches -Has tried: theraflu -Pertinent past medical history: see below -denies any chance of pregnancy -Pertinent medication allergies: Allergies  Allergen Reactions   Buspirone Nausea Only   Paxil  [Paroxetine Hcl] Nausea Only  -COVID-19 vaccine status:  Immunization History  Administered Date(s) Administered   Influenza Split 08/29/2013   Influenza,inj,Quad PF,6+ Mos 11/04/2018, 08/26/2019   Influenza-Unspecified 08/11/2020, 04/28/2021   PFIZER Comirnaty(Gray Top)Covid-19 Tri-Sucrose Vaccine 11/20/2019, 12/11/2019, 04/28/2020   Tdap 07/20/2017     ROS: See pertinent positives and negatives per HPI.  Past Medical History:  Diagnosis Date   Anxiety    Hyperlipidemia    HYPERTENSION, BENIGN ESSENTIAL 06/16/2008   Qualifier: Diagnosis of  By: Daphine Deutscher FNP, [place]     Polymyositis [facility]) 2009   RLS (restless legs syndrome)    Vitamin D deficiency     Past Surgical History:  Procedure Laterality Date   BRAIN SURGERY       Current Outpatient Medications:    benzonatate (TESSALON PERLES) 100 MG capsule, 1-2 capsules up to twice daily as needed for cough, Disp: 30  capsule, Rfl: 0   cycloSPORINE (RESTASIS) 0.05 % ophthalmic emulsion, 1 drop 2 (two) times daily., Disp: , Rfl:    doxycycline (VIBRA-TABS) 100 MG tablet, Take 1 tablet (100 mg total) by mouth 2 (two) times daily., Disp: 14 tablet, Rfl: 0   HYDROcodone-acetaminophen (NORCO/VICODIN) 5-325 MG tablet, Take 1 tablet by mouth every 6 (six) hours as needed., Disp: 60 tablet, Rfl: 0   ibuprofen (ADVIL) 600 MG tablet, TAKE 1 TABLET (600 MG TOTAL) BY MOUTH EVERY 12 (TWELVE) HOURS AS NEEDED (WITH FOOD)., Disp: 60 tablet, Rfl: 2   Multiple Vitamins-Minerals (ONE-A-DAY VITACRAVES ADULT) CHEW, Chew 1 each by mouth daily., Disp: , Rfl:    predniSONE (DELTASONE) 2.5 MG tablet, Take 7 mg by mouth., Disp: , Rfl:    propranolol (INDERAL) 10 MG tablet, TAKE 1-2 TABLETS (10-20 MG TOTAL) BY MOUTH DAILY AS NEEDED (ANXIETY)., Disp: 60 tablet, Rfl: 1   riTUXimab (RITUXAN) 500 MG/50ML injection, Inject 1,000 mg into the vein every 6 (six) months. , Disp: , Rfl:    valACYclovir (VALTREX) 500 MG tablet, Take 1 tablet (500 mg total) by mouth daily. for 3 days at onset of symptoms related with an outbreak, Disp: 90 tablet, Rfl: 3   citalopram (CELEXA) 20 MG tablet, Take 1 tablet (20 mg total) by mouth daily., Disp: 90 tablet, Rfl: 3  EXAM:  VITALS per patient if applicable:  GENERAL: alert, oriented, appears well and in no acute distress  HEENT: atraumatic, conjunttiva clear, no obvious abnormalities on inspection of external nose and ears  NECK: normal movements of the head and neck  LUNGS: on inspection no signs of respiratory  distress, breathing rate appears normal, no obvious gross SOB, gasping or wheezing  CV: no obvious cyanosis  MS: moves all visible extremities without noticeable abnormality  PSYCH/NEURO: pleasant and cooperative, no obvious depression or anxiety, speech and thought processing grossly intact  ASSESSMENT AND PLAN:  Discussed the following assessment and plan:  Cough, unspecified  type  Nasal sinus congestion  -we discussed possible serious and likely etiologies, options for evaluation and workup, limitations of telemedicine visit vs in person visit, treatment, treatment risks and precautions. Pt is agreeable to treatment via telemedicine at this moment. Query influenza vs other viral resp illness vs other. Advsed nasal saline twice daily and cough Rx provided. Given she feels is worsening also discussed possibility of developing 2ndary bacterial resp illness. She opted for delayed abx in case worsening or not improving over the next few days.   Advised to seek prompt virtual visit or in person care if worsening, new symptoms arise, or if is not improving with treatment as expected per our conversation of expected course.    I discussed the assessment and treatment plan with the patient. The patient was provided an opportunity to ask questions and all were answered. The patient agreed with the plan and demonstrated an understanding of the instructions.     Terressa Koyanagi, DO

## 2021-09-20 ENCOUNTER — Ambulatory Visit (INDEPENDENT_AMBULATORY_CARE_PROVIDER_SITE_OTHER): Payer: Medicare Other

## 2021-09-20 DIAGNOSIS — Z0000 Encounter for general adult medical examination without abnormal findings: Secondary | ICD-10-CM

## 2021-09-20 NOTE — Progress Notes (Signed)
Subjective:   [name] is a 46 y.o. female who presents for an Initial Medicare Annual Wellness Visit.  I connected with [place]  today by telephone and verified that I am speaking with the correct person using two identifiers. Location patient: home Location provider: work Persons participating in the virtual visit: patient, provider.   I discussed the limitations, risks, security and privacy concerns of performing an evaluation and management service by telephone and the availability of in person appointments. I also discussed with the patient that there may be a patient responsible charge related to this service. The patient expressed understanding and verbally consented to this telephonic visit.    Interactive audio and video telecommunications were attempted between this provider and patient, however failed, due to patient having technical difficulties OR patient did not have access to video capability.  We continued and completed visit with audio only.    Review of Systems     Cardiac Risk Factors include: none     Objective:    Today's Vitals   There is no height or weight on file to calculate BMI.  Advanced Directives 09/20/2021 10/31/2019 04/23/2019 04/22/2017 04/22/2017 12/30/2014 08/04/2014  Does Patient Have a Medical Advance Directive? No Yes No No No No No  Does patient want to make changes to medical advance directive? - Yes (ED - Information included in AVS) - - - - -  Would patient like information on creating a medical advance directive? No - Patient declined - No - Patient declined - - - No - patient declined information  Some encounter information is confidential and restricted. Go to Review Flowsheets activity to see all data.    Current Medications (verified) Outpatient Encounter Medications as of 09/20/2021  Medication Sig   benzonatate (TESSALON PERLES) 100 MG capsule 1-2 capsules up to twice daily as needed for cough   cycloSPORINE (RESTASIS) 0.05 %  ophthalmic emulsion 1 drop 2 (two) times daily.   doxycycline (VIBRA-TABS) 100 MG tablet Take 1 tablet (100 mg total) by mouth 2 (two) times daily.   HYDROcodone-acetaminophen (NORCO/VICODIN) 5-325 MG tablet Take 1 tablet by mouth every 6 (six) hours as needed.   ibuprofen (ADVIL) 600 MG tablet TAKE 1 TABLET (600 MG TOTAL) BY MOUTH EVERY 12 (TWELVE) HOURS AS NEEDED (WITH FOOD).   Multiple Vitamins-Minerals (ONE-A-DAY VITACRAVES ADULT) CHEW Chew 1 each by mouth daily.   predniSONE (DELTASONE) 2.5 MG tablet Take 7 mg by mouth.   propranolol (INDERAL) 10 MG tablet TAKE 1-2 TABLETS (10-20 MG TOTAL) BY MOUTH DAILY AS NEEDED (ANXIETY).   riTUXimab (RITUXAN) 500 MG/50ML injection Inject 1,000 mg into the vein every 6 (six) months.    valACYclovir (VALTREX) 500 MG tablet Take 1 tablet (500 mg total) by mouth daily. for 3 days at onset of symptoms related with an outbreak   citalopram (CELEXA) 20 MG tablet Take 1 tablet (20 mg total) by mouth daily.   No facility-administered encounter medications on file as of 09/20/2021.    Allergies (verified) Buspirone and Paxil  [paroxetine hcl]   History: Past Medical History:  Diagnosis Date   Anxiety    Hyperlipidemia    HYPERTENSION, BENIGN ESSENTIAL 06/16/2008   Qualifier: Diagnosis of  By: Hassell Done FNP, [place]     Polymyositis [facility]) 2009   RLS (restless legs syndrome)    Vitamin D deficiency    Past Surgical History:  Procedure Laterality Date   BRAIN SURGERY     Family History  Problem Relation Age of  Onset   Hepatitis Mother    Diabetes Paternal Uncle    Diabetes Maternal Grandmother    Heart disease Paternal Grandfather    Breast cancer Maternal Aunt        ? age of onset   Social History   Socioeconomic History   Marital status: Married    Spouse name: Not on file   Number of children: Not on file   Years of education: Not on file   Highest education level: Not on file  Occupational History   Not on file  Tobacco Use   Smoking  status: Former    Types: Cigarettes   Smokeless tobacco: Never  Vaping Use   Vaping Use: Never used  Substance and Sexual Activity   Alcohol use: Yes    Comment: Occasional   Drug use: Yes    Types: Marijuana    Comment: Episodic   Sexual activity: Yes  Other Topics Concern   Not on file  Social History Narrative   Not on file   Social Determinants of Health   Financial Resource Strain: Low Risk    Difficulty of Paying Living Expenses: Not hard at all  Food Insecurity: No Food Insecurity   Worried About Charity fundraiser in the Last Year: Never true   [place] in the Last Year: Never true  Transportation Needs: No Transportation Needs   Lack of Transportation (Medical): No   Lack of Transportation (Non-Medical): No  Physical Activity: Inactive   Days of Exercise per Week: 0 days   Minutes of Exercise per Session: 0 min  Stress: No Stress Concern Present   Feeling of Stress : Not at all  Social Connections: Socially Isolated   Frequency of Communication with Friends and Family: Twice a week   Frequency of Social Gatherings with Friends and Family: Twice a week   Attends Religious Services: Never   Marine scientist or Organizations: No   Attends Music therapist: Never   Marital Status: Never married    Tobacco Counseling Counseling given: Not Answered   Clinical Intake:  Pre-visit preparation completed: Yes  Pain : No/denies pain     Nutritional Risks: None Diabetes: No  How often do you need to have someone help you when you read instructions, pamphlets, or other written materials from your doctor or pharmacy?: 1 - Never What is the last grade level you completed in school?: 11th grade  Diabetic?no   Interpreter Needed?: No  Information entered by :: L.Rusell Meneely,LPN   Activities of Daily Living In your present state of health, do you have any difficulty performing the following activities: 09/20/2021  Hearing? N  Vision? N   Difficulty concentrating or making decisions? N  Walking or climbing stairs? N  Dressing or bathing? N  Doing errands, shopping? N  Preparing Food and eating ? N  Using the Toilet? N  In the past six months, have you accidently leaked urine? N  Do you have problems with loss of bowel control? N  Managing your Medications? N  Managing your Finances? N  Housekeeping or managing your Housekeeping? N  Some recent data might be hidden    Patient Care Team: Nche, Charlene Brooke, NP as PCP - General (Internal Medicine)  Indicate any recent Medical Services you may have received from other than [facility] providers in the past year (date may be approximate).     Assessment:   This is a routine wellness examination for [name].  Hearing/Vision screen Vision Screening - Comments:: Annual eye exams wear glasses  Dietary issues and exercise activities discussed: Current Exercise Habits: The patient does not participate in regular exercise at present, Exercise limited by: neurologic condition(s)   Goals Addressed             This Visit's Progress    Patient Stated   On track    Keep blood pressure down       Depression Screen PHQ 2/9 Scores 09/20/2021 09/20/2021 05/06/2021 05/06/2021 11/01/2020 10/05/2020 08/25/2020  PHQ - 2 Score 0 0 0 0 0 5 5  PHQ- 9 Score - - - - 0 20 15  Some encounter information is confidential and restricted. Go to Review Flowsheets activity to see all data.    Fall Risk Fall Risk  09/20/2021 05/06/2021 11/01/2020 08/25/2020 05/10/2020  Falls in the past year? 0 1 0 0 0  Number falls in past yr: 0 0 0 0 -  Injury with Fall? 0 1 0 0 -  Risk for fall due to : No Fall Risks History of fall(s) - - -  Risk for fall due to: Comment cane - - - -  Follow up Falls evaluation completed Falls evaluation completed Falls evaluation completed Falls evaluation completed Falls evaluation completed    [place]:  Any stairs in or around the home? No   If so, are there any without handrails? No  Home free of loose throw rugs in walkways, pet beds, electrical cords, etc? Yes  Adequate lighting in your home to reduce risk of falls? Yes   ASSISTIVE DEVICES UTILIZED TO PREVENT FALLS:  Life alert? No  Use of a cane, walker or w/c? Yes  Grab bars in the bathroom? Yes  Shower chair or bench in shower? Yes  Elevated toilet seat or a handicapped toilet? Yes    Cognitive Function:  Normal cognitive status assessed by direct observation by this Nurse Health Advisor. No abnormalities found.     6CIT Screen 10/31/2019 04/23/2019 04/23/2019  What Year? 0 points 0 points 0 points  What month? 0 points 0 points 0 points  What time? 0 points 0 points 0 points  Count back from 20 0 points 0 points 0 points  Months in reverse 0 points 0 points 0 points  Repeat phrase 0 points 0 points -  Total Score 0 0 -    Immunizations Immunization History  Administered Date(s) Administered   Influenza Split 08/29/2013   Influenza,inj,Quad PF,6+ Mos 11/04/2018, 08/26/2019   Influenza-Unspecified 08/11/2020, 04/28/2021   PFIZER Comirnaty(Gray Top)Covid-19 Tri-Sucrose Vaccine 11/20/2019, 12/11/2019, 04/28/2020   Tdap 07/20/2017    TDAP status: Up to date  Flu Vaccine status: Up to date  Pneumococcal vaccine status: Up to date  Covid-19 vaccine status: Completed vaccines  Qualifies for Shingles Vaccine? No   Zostavax completed No   Shingrix Completed?: No.    Education has been provided regarding the importance of this vaccine. Patient has been advised to call insurance company to determine out of pocket expense if they have not yet received this vaccine. Advised may also receive vaccine at local pharmacy or Health Dept. Verbalized acceptance and understanding.  Screening Tests Health Maintenance  Topic Date Due   COVID-19 Vaccine (4 - Booster for Pfizer series) 10/01/2021 (Originally 06/23/2020)   PAP SMEAR-Modifier  11/11/2022   TETANUS/TDAP   07/21/2027   INFLUENZA VACCINE  Completed   Hepatitis C Screening  Completed   HIV Screening  Completed  HPV VACCINES  Aged Out    Health Maintenance  There are no preventive care reminders to display for this patient.  Colorectal cancer screening: No longer required.   Mammogram status: Completed 06/07/2021. Repeat every year  Bone Density status: Completed does not qualify due to age . Results reflect: Bone density results: NORMAL. Repeat every 5 years.  Lung Cancer Screening: (Low Dose CT Chest recommended if Age 50-80 years, 30 pack-year currently smoking OR have quit w/in 15years.) does not qualify.   Lung Cancer Screening Referral: n/a  Additional Screening:  Hepatitis C Screening: does not qualify;  Vision Screening: Recommended annual ophthalmology exams for early detection of glaucoma and other disorders of the eye. Is the patient up to date with their annual eye exam?  Yes  Who is the provider or what is the name of the office in which the patient attends annual eye exams? [facility]  If pt is not established with a provider, would they like to be referred to a provider to establish care? No .   Dental Screening: Recommended annual dental exams for proper oral hygiene  Community Resource Referral / Chronic Care Management: CRR required this visit?  No   CCM required this visit?  No      Plan:     I have personally reviewed and noted the following in the patients chart:   Medical and social history Use of alcohol, tobacco or illicit drugs  Current medications and supplements including opioid prescriptions. Patient is not currently taking opioid prescriptions. Functional ability and status Nutritional status Physical activity Advanced directives List of other physicians Hospitalizations, surgeries, and ER visits in previous 12 months Vitals Screenings to include cognitive, depression, and falls Referrals and appointments  In addition, I have reviewed  and discussed with patient certain preventive protocols, quality metrics, and best practice recommendations. A written personalized care plan for preventive services as well as general preventive health recommendations were provided to patient.     Randel Pigg, LPN   [id number]   Nurse Notes: none

## 2021-09-20 NOTE — Patient Instructions (Signed)
[name] , Thank you for taking time to come for your Medicare Wellness Visit. I appreciate your ongoing commitment to your health goals. Please review the following plan we discussed and let me know if I can assist you in the future.   Screening recommendations/referrals: Colonoscopy: not of age Mammogram: 06/07/2021 Bone Density: not of age  Recommended yearly ophthalmology/optometry visit for glaucoma screening and checkup Recommended yearly dental visit for hygiene and checkup  Vaccinations: Influenza vaccine: completed  Pneumococcal vaccine: not of age  Tdap vaccine: 07/20/2017 Shingles vaccine: not of age     Advanced directives: none   Conditions/risks identified: none   Next appointment: none    Preventive Care 27 Years and Older, Female Preventive care refers to lifestyle choices and visits with your health care provider that can promote health and wellness. What does preventive care include? A yearly physical exam. This is also called an annual well check. Dental exams once or twice a year. Routine eye exams. Ask your health care provider how often you should have your eyes checked. Personal lifestyle choices, including: Daily care of your teeth and gums. Regular physical activity. Eating a healthy diet. Avoiding tobacco and drug use. Limiting alcohol use. Practicing safe sex. Taking low-dose aspirin every day. Taking vitamin and mineral supplements as recommended by your health care provider. What happens during an annual well check? The services and screenings done by your health care provider during your annual well check will depend on your age, overall health, lifestyle risk factors, and family history of disease. Counseling  Your health care provider may ask you questions about your: Alcohol use. Tobacco use. Drug use. Emotional well-being. Home and relationship well-being. Sexual activity. Eating habits. History of falls. Memory and ability to  understand (cognition). Work and work Astronomer. Reproductive health. Screening  You may have the following tests or measurements: Height, weight, and BMI. Blood pressure. Lipid and cholesterol levels. These may be checked every 5 years, or more frequently if you are over 66 years old. Skin check. Lung cancer screening. You may have this screening every year starting at age 43 if you have a 30-pack-year history of smoking and currently smoke or have quit within the past 15 years. Fecal occult blood test (FOBT) of the stool. You may have this test every year starting at age 4. Flexible sigmoidoscopy or colonoscopy. You may have a sigmoidoscopy every 5 years or a colonoscopy every 10 years starting at age 49. Hepatitis C blood test. Hepatitis B blood test. Sexually transmitted disease (STD) testing. Diabetes screening. This is done by checking your blood sugar (glucose) after you have not eaten for a while (fasting). You may have this done every 1-3 years. Bone density scan. This is done to screen for osteoporosis. You may have this done starting at age 54. Mammogram. This may be done every 1-2 years. Talk to your health care provider about how often you should have regular mammograms. Talk with your health care provider about your test results, treatment options, and if necessary, the need for more tests. Vaccines  Your health care provider may recommend certain vaccines, such as: Influenza vaccine. This is recommended every year. Tetanus, diphtheria, and acellular pertussis (Tdap, Td) vaccine. You may need a Td booster every 10 years. Zoster vaccine. You may need this after age 92. Pneumococcal 13-valent conjugate (PCV13) vaccine. One dose is recommended after age 16. Pneumococcal polysaccharide (PPSV23) vaccine. One dose is recommended after age 59. Talk to your health care provider about which  screenings and vaccines you need and how often you need them. This information is not  intended to replace advice given to you by your health care provider. Make sure you discuss any questions you have with your health care provider. Document Released: 09/10/2015 Document Revised: 05/03/2016 Document Reviewed: 06/15/2015 Elsevier Interactive Patient Education  2017 [place] Prevention in the Home Falls can cause injuries. They can happen to people of all ages. There are many things you can do to make your home safe and to help prevent falls. What can I do on the outside of my home? Regularly fix the edges of walkways and driveways and fix any cracks. Remove anything that might make you trip as you walk through a door, such as a raised step or threshold. Trim any bushes or trees on the path to your home. Use bright outdoor lighting. Clear any walking paths of anything that might make someone trip, such as rocks or tools. Regularly check to see if handrails are loose or broken. Make sure that both sides of any steps have handrails. Any raised decks and porches should have guardrails on the edges. Have any leaves, snow, or ice cleared regularly. Use sand or salt on walking paths during winter. Clean up any spills in your garage right away. This includes oil or grease spills. What can I do in the bathroom? Use night lights. Install grab bars by the toilet and in the tub and shower. Do not use towel bars as grab bars. Use non-skid mats or decals in the tub or shower. If you need to sit down in the shower, use a plastic, non-slip stool. Keep the floor dry. Clean up any water that spills on the floor as soon as it happens. Remove soap buildup in the tub or shower regularly. Attach bath mats securely with double-sided non-slip rug tape. Do not have throw rugs and other things on the floor that can make you trip. What can I do in the bedroom? Use night lights. Make sure that you have a light by your bed that is easy to reach. Do not use any sheets or blankets that are  too big for your bed. They should not hang down onto the floor. Have a firm chair that has side arms. You can use this for support while you get dressed. Do not have throw rugs and other things on the floor that can make you trip. What can I do in the kitchen? Clean up any spills right away. Avoid walking on wet floors. Keep items that you use a lot in easy-to-reach places. If you need to reach something above you, use a strong step stool that has a grab bar. Keep electrical cords out of the way. Do not use floor polish or wax that makes floors slippery. If you must use wax, use non-skid floor wax. Do not have throw rugs and other things on the floor that can make you trip. What can I do with my stairs? Do not leave any items on the stairs. Make sure that there are handrails on both sides of the stairs and use them. Fix handrails that are broken or loose. Make sure that handrails are as long as the stairways. Check any carpeting to make sure that it is firmly attached to the stairs. Fix any carpet that is loose or worn. Avoid having throw rugs at the top or bottom of the stairs. If you do have throw rugs, attach them to the floor with carpet  tape. Make sure that you have a light switch at the top of the stairs and the bottom of the stairs. If you do not have them, ask someone to add them for you. What else can I do to help prevent falls? Wear shoes that: Do not have high heels. Have rubber bottoms. Are comfortable and fit you well. Are closed at the toe. Do not wear sandals. If you use a stepladder: Make sure that it is fully opened. Do not climb a closed stepladder. Make sure that both sides of the stepladder are locked into place. Ask someone to hold it for you, if possible. Clearly mark and make sure that you can see: Any grab bars or handrails. First and last steps. Where the edge of each step is. Use tools that help you move around (mobility aids) if they are needed. These  include: Canes. Walkers. Scooters. Crutches. Turn on the lights when you go into a dark area. Replace any light bulbs as soon as they burn out. Set up your furniture so you have a clear path. Avoid moving your furniture around. If any of your floors are uneven, fix them. If there are any pets around you, be aware of where they are. Review your medicines with your doctor. Some medicines can make you feel dizzy. This can increase your chance of falling. Ask your doctor what other things that you can do to help prevent falls. This information is not intended to replace advice given to you by your health care provider. Make sure you discuss any questions you have with your health care provider. Document Released: 06/10/2009 Document Revised: 01/20/2016 Document Reviewed: 09/18/2014 Elsevier Interactive Patient Education  2017 [employer].

## 2021-11-03 ENCOUNTER — Encounter: Payer: Self-pay | Admitting: Nurse Practitioner

## 2021-11-03 ENCOUNTER — Other Ambulatory Visit: Payer: Self-pay | Admitting: Nurse Practitioner

## 2021-11-03 ENCOUNTER — Ambulatory Visit (INDEPENDENT_AMBULATORY_CARE_PROVIDER_SITE_OTHER): Payer: Medicare Other | Admitting: Nurse Practitioner

## 2021-11-03 ENCOUNTER — Other Ambulatory Visit: Payer: Self-pay

## 2021-11-03 VITALS — BP 120/84 | HR 60 | Temp 96.8°F | Ht 69.0 in | Wt 172.6 lb

## 2021-11-03 DIAGNOSIS — E7800 Pure hypercholesterolemia, unspecified: Secondary | ICD-10-CM | POA: Diagnosis not present

## 2021-11-03 DIAGNOSIS — F325 Major depressive disorder, single episode, in full remission: Secondary | ICD-10-CM | POA: Diagnosis not present

## 2021-11-03 DIAGNOSIS — Z7952 Long term (current) use of systemic steroids: Secondary | ICD-10-CM | POA: Diagnosis not present

## 2021-11-03 DIAGNOSIS — M818 Other osteoporosis without current pathological fracture: Secondary | ICD-10-CM

## 2021-11-03 DIAGNOSIS — N946 Dysmenorrhea, unspecified: Secondary | ICD-10-CM

## 2021-11-03 LAB — LIPID PANEL
Cholesterol: 212 mg/dL — ABNORMAL HIGH (ref 0–200)
HDL: 44.8 mg/dL (ref >39.00)
LDL Cholesterol: 142 mg/dL — ABNORMAL HIGH (ref 0–99)
NonHDL: 167.07
Total CHOL/HDL Ratio: 5
Triglycerides: 125 mg/dL (ref 0.0–149.0)
VLDL: 25 mg/dL (ref 0.0–40.0)

## 2021-11-03 LAB — BASIC METABOLIC PANEL
BUN: 13 mg/dL (ref 6–23)
CO2: 23 mEq/L (ref 19–32)
Calcium: 9 mg/dL (ref 8.4–10.5)
Chloride: 109 mEq/L (ref 96–112)
Creatinine, Ser: 0.5 mg/dL (ref 0.40–1.20)
GFR: 114.53 mL/min (ref >60.00)
Glucose, Bld: 83 mg/dL (ref 70–99)
Potassium: 5.1 mEq/L (ref 3.5–5.1)
Sodium: 138 mEq/L (ref 135–145)

## 2021-11-03 LAB — HEMOGLOBIN A1C: Hgb A1c MFr Bld: 5.2 % (ref 4.6–6.5)

## 2021-11-03 MED ORDER — ALENDRONATE SODIUM 70 MG PO TABS: 70.0000 mg | ORAL_TABLET | ORAL | 3 refills | Status: DC

## 2021-11-03 MED ORDER — CITALOPRAM HYDROBROMIDE 20 MG PO TABS: 20.0000 mg | ORAL_TABLET | Freq: Every day | ORAL | 3 refills | Status: DC

## 2021-11-03 NOTE — Patient Instructions (Signed)
Go to lab for blood draw ?Start fosamax once weekly. ?Maintain calcium and vit. D supplement. ? ?Alendronate Tablets ?What is this medication? ?ALENDRONATE (a LEN droe nate) prevents and treats osteoporosis. It may also be used to treat Paget disease of the bone. It works by Interior and spatial designer stronger and less likely to break (fracture). It belongs to a group of medications called bisphosphonates. ?This medicine may be used for other purposes; ask your health care provider or pharmacist if you have questions. ?COMMON BRAND NAME(S): Fosamax ?What should I tell my care team before I take this medication? ?They need to know if you have any of these conditions: ?Bleeding disorder ?Cancer ?Dental disease ?Difficulty swallowing ?Infection (fever, chills, cough, sore throat, pain or trouble passing urine) ?Kidney disease ?Low levels of calcium or other minerals in the blood ?Low red blood cell counts ?Receiving steroids like dexamethasone or prednisone ?Stomach or intestine problems ?Trouble sitting or standing for 30 minutes ?An unusual or allergic reaction to alendronate, other medications, foods, dyes or preservatives ?Pregnant or trying to get pregnant ?Breast-feeding ?How should I use this medication? ?Take this medication by mouth with a full glass of water. Take it as directed on the prescription label at the same time every day. Take the dose right after waking up. Do not eat or drink anything before taking it. Do not take it with any other drink except water. Do not chew or crush the tablet. After taking it, do not eat breakfast, drink, or take any other medications or vitamins for at least 30 minutes. Sit or stand up for at least 30 minutes after you take it. Do not lie down. Keep taking it unless your care team tells you to stop. ?A special MedGuide will be given to you by the pharmacist with each prescription and refill. Be sure to read this information carefully each time. ?Talk to your care team about the use  of this medication in children. Special care may be needed. ?Overdosage: If you think you have taken too much of this medicine contact a poison control center or emergency room at once. ?NOTE: This medicine is only for you. Do not share this medicine with others. ?What if I miss a dose? ?If you take your medication once a day, skip it. Take your next dose at the scheduled time the next morning. Do not take two doses on the same day. ?If you take your medication once a week, take the missed dose on the morning after you remember. Do not take two doses on the same day. ?What may interact with this medication? ?Aluminum hydroxide ?Antacids ?Aspirin ?Calcium supplements ?Medications for inflammation like ibuprofen, naproxen, and others ?Iron supplements ?Magnesium supplements ?Vitamins with minerals ?This list may not describe all possible interactions. Give your health care provider a list of all the medicines, herbs, non-prescription drugs, or dietary supplements you use. Also tell them if you smoke, drink alcohol, or use illegal drugs. Some items may interact with your medicine. ?What should I watch for while using this medication? ?Visit your care team for regular checks on your progress. It may be some time before you see the benefit from this medication. ?Some people who take this medication have severe bone, joint, or muscle pain. This medication may also increase your risk for jaw problems or a broken thigh bone. Tell your care team right away if you have severe pain in your jaw, bones, joints, or muscles. Tell you care team if you have any pain that  does not go away or that gets worse. ?Tell your dentist and dental surgeon that you are taking this medication. You should not have major dental surgery while on this medication. See your dentist to have a dental exam and fix any dental problems before starting this medication. Take good care of your teeth while on this medication. Make sure you see your dentist for  regular follow-up appointments. ?You should make sure you get enough calcium and vitamin D while you are taking this medication. Discuss the foods you eat and the vitamins you take with your care team. ?You may need blood work done while you are taking this medication. ?What side effects may I notice from receiving this medication? ?Side effects that you should report to your care team as soon as possible: ?Allergic reactions--skin rash, itching, hives, swelling of the face, lips, tongue, or throat ?Low calcium level--muscle pain or cramps, confusion, tingling, or numbness in the hands or feet ?Osteonecrosis of the jaw--pain, swelling, or redness in the mouth, numbness of the jaw, poor healing after dental work, unusual discharge from the mouth, visible bones in the mouth ?Pain or trouble swallowing ?Severe bone, joint, or muscle pain ?Stomach bleeding--bloody or black, tar-like stools, vomiting blood or brown material that looks like coffee grounds ?Side effects that usually do not require medical attention (report to your care team if they continue or are bothersome): ?Constipation ?Diarrhea ?Nausea ?Stomach pain ?This list may not describe all possible side effects. Call your doctor for medical advice about side effects. You may report side effects to FDA at 1-800-FDA-1088. ?Where should I keep my medication? ?Keep out of the reach of children and pets. ?Store at room temperature between 15 and 30 degrees C (59 and 86 degrees F). Throw away any unused medication after the expiration date. ?NOTE: This sheet is a summary. It may not cover all possible information. If you have questions about this medicine, talk to your doctor, pharmacist, or health care provider. ?? 2022 Elsevier/Gold Standard (2020-08-26 00:00:00) ? ?

## 2021-11-03 NOTE — Assessment & Plan Note (Addendum)
Last dexa scan 2021: The BMD measured at Dual Femur Total Right is 0.756g/cm2 with a ?Z-score of -2.7. ?Current long term systemic corticosteriod. ?Current use of calcium 600mg  and vit. D 1000IU daily ?We discussed use of fosamax and possible side effects. She verbalized understanding and agreed to start medication. ? ?Check BMP today ?Start fosamax 70mg  weekly ?

## 2021-11-03 NOTE — Assessment & Plan Note (Signed)
Stable mood with citalopram ?Med refill sent ?

## 2021-11-03 NOTE — Assessment & Plan Note (Signed)
Repeat lipid panel ?

## 2021-11-03 NOTE — Progress Notes (Signed)
? ?Subjective:  ?Patient ID: [name], female    DOB: September 25, 1975  Age: 45 y.o. MRN: [id number] ? ?CC: Follow-up (6 month f/u on anxiety and depression/) ? ?HPI ? ?Osteoporosis ?Last dexa scan 2021: The BMD measured at Dual Femur Total Right is 0.756g/cm2 with a ?Z-score of -2.7. ?Current long term systemic corticosteriod. ?Current use of calcium 600mg  and vit. D 1000IU daily ?We discussed use of fosamax and possible side effects. She verbalized understanding and agreed to start medication. ? ?Check BMP today ?Start fosamax 70mg  weekly ? ?Depression, major, single episode, complete remission ([place]) ?Stable mood with citalopram ?Med refill sent ? ?HYPERCHOLESTEROLEMIA ?Repeat lipid panel ? ?Long term systemic steroid user ?Check hgbA1c, lipid panel and BMP. ?Normal TSH 11months ago ? ?Reviewed past Medical, Social and Family history today. ? ?Outpatient Medications Prior to Visit  ?Medication Sig Dispense Refill  ? cycloSPORINE (RESTASIS) 0.05 % ophthalmic emulsion 1 drop 2 (two) times daily.    ? HYDROcodone-acetaminophen (NORCO/VICODIN) 5-325 MG tablet Take 1 tablet by mouth every 6 (six) hours as needed. 60 tablet 0  ? ibuprofen (ADVIL) 600 MG tablet TAKE 1 TABLET (600 MG TOTAL) BY MOUTH EVERY 12 (TWELVE) HOURS AS NEEDED (WITH FOOD). 60 tablet 2  ? Multiple Vitamins-Minerals (ONE-A-DAY VITACRAVES ADULT) CHEW Chew 1 each by mouth daily.    ? predniSONE (DELTASONE) 2.5 MG tablet Take 7 mg by mouth.    ? propranolol (INDERAL) 10 MG tablet TAKE 1-2 TABLETS (10-20 MG TOTAL) BY MOUTH DAILY AS NEEDED (ANXIETY). 60 tablet 1  ? riTUXimab (RITUXAN) 500 MG/50ML injection Inject 1,000 mg into the vein every 6 (six) months.     ? valACYclovir (VALTREX) 500 MG tablet Take 1 tablet (500 mg total) by mouth daily. for 3 days at onset of symptoms related with an outbreak 90 tablet 3  ? doxycycline (VIBRA-TABS) 100 MG tablet Take 1 tablet (100 mg total) by mouth 2 (two) times daily. 14 tablet 0  ? benzonatate (TESSALON PERLES) 100  MG capsule 1-2 capsules up to twice daily as needed for cough (Patient not taking: Reported on 11/03/2021) 30 capsule 0  ? citalopram (CELEXA) 20 MG tablet Take 1 tablet (20 mg total) by mouth daily. 90 tablet 3  ? ?No facility-administered medications prior to visit.  ? ? ?ROS ?See HPI ? ?Objective:  ?BP 120/84 (BP Location: Left Arm, Patient Position: Sitting, Cuff Size: Normal)   Pulse 60   Temp (!) 96.8 ?F (36 ?C) (Temporal)   Ht 5\' 9"  (1.753 m)   Wt 172 lb 9.6 oz (78.3 kg)   SpO2 100%   BMI 25.49 kg/m?  ? ?Physical Exam ?Vitals and nursing note reviewed.  ?Cardiovascular:  ?   Rate and Rhythm: Normal rate and regular rhythm.  ?   Pulses: Normal pulses.  ?   Heart sounds: Normal heart sounds.  ?Pulmonary:  ?   Effort: Pulmonary effort is normal.  ?   Breath sounds: Normal breath sounds.  ?Musculoskeletal:  ?   Right lower leg: No edema.  ?   Left lower leg: No edema.  ?Neurological:  ?   Mental Status: She is alert and oriented to person, place, and time.  ?Psychiatric:     ?   Mood and Affect: Mood normal.     ?   Behavior: Behavior normal.     ?   Thought Content: Thought content normal.  ? ? ?Assessment & Plan:  ?This visit occurred during the SARS-CoV-2 public health emergency.  Safety protocols were in place, including screening questions prior to the visit, additional usage of staff PPE, and extensive cleaning of exam room while observing appropriate contact time as indicated for disinfecting solutions.  ? ?[name] was seen today for follow-up. ? ?Diagnoses and all orders for this visit: ? ?Depression, major, single episode, complete remission (HCC) ?-     citalopram (CELEXA) 20 MG tablet; Take 1 tablet (20 mg total) by mouth daily. ? ?HYPERCHOLESTEROLEMIA ?-     Lipid panel ? ?Long term systemic steroid user ?-     Hemoglobin A1c ? ?Other osteoporosis without current pathological fracture ?-     alendronate (FOSAMAX) 70 MG tablet; Take 1 tablet (70 mg total) by mouth once a week. Take with a full glass  of water on an empty stomach. ?-     Basic metabolic panel ? ? ? ?Problem List Items Addressed This Visit   ? ?  ? Musculoskeletal and Integument  ? Osteoporosis  ?  Last dexa scan 2021: The BMD measured at Dual Femur Total Right is 0.756g/cm2 with a ?Z-score of -2.7. ?Current long term systemic corticosteriod. ?Current use of calcium 600mg  and vit. D 1000IU daily ?We discussed use of fosamax and possible side effects. She verbalized understanding and agreed to start medication. ? ?Check BMP today ?Start fosamax 70mg  weekly ?  ?  ? Relevant Medications  ? alendronate (FOSAMAX) 70 MG tablet  ? Other Relevant Orders  ? Basic metabolic panel  ?  ? Other  ? Depression, major, single episode, complete remission ([place]) - Primary  ?  Stable mood with citalopram ?Med refill sent ?  ?  ? Relevant Medications  ? citalopram (CELEXA) 20 MG tablet  ? HYPERCHOLESTEROLEMIA  ?  Repeat lipid panel ?  ?  ? Relevant Orders  ? Lipid panel  ? Long term systemic steroid user  ?  Check hgbA1c, lipid panel and BMP. ?Normal TSH 76months ago ? ?  ?  ? Relevant Orders  ? Hemoglobin A1c  ?  ?Follow-up: Return in about 6 months (around 05/06/2022) for CPE (fasting). ? ?Wilfred Lacy, NP ?

## 2021-11-03 NOTE — Assessment & Plan Note (Signed)
Check hgbA1c, lipid panel and BMP. ?Normal TSH 76months ago ? ?

## 2022-01-28 ENCOUNTER — Other Ambulatory Visit: Payer: Self-pay | Admitting: Nurse Practitioner

## 2022-01-28 DIAGNOSIS — N946 Dysmenorrhea, unspecified: Secondary | ICD-10-CM

## 2022-01-30 NOTE — Telephone Encounter (Signed)
Chart Supports RX Last OV: 10/2021 Next OV: 04/2022

## 2022-02-02 DIAGNOSIS — Z7983 Long term (current) use of bisphosphonates: Secondary | ICD-10-CM | POA: Diagnosis not present

## 2022-02-02 DIAGNOSIS — Z7952 Long term (current) use of systemic steroids: Secondary | ICD-10-CM | POA: Diagnosis not present

## 2022-02-02 DIAGNOSIS — M3320 Polymyositis, organ involvement unspecified: Secondary | ICD-10-CM | POA: Diagnosis not present

## 2022-02-02 DIAGNOSIS — Z79620 Long term (current) use of immunosuppressive biologic: Secondary | ICD-10-CM | POA: Diagnosis not present

## 2022-02-02 DIAGNOSIS — Z9889 Other specified postprocedural states: Secondary | ICD-10-CM | POA: Diagnosis not present

## 2022-02-10 DIAGNOSIS — M25562 Pain in left knee: Secondary | ICD-10-CM | POA: Diagnosis not present

## 2022-02-10 DIAGNOSIS — Z7409 Other reduced mobility: Secondary | ICD-10-CM | POA: Diagnosis not present

## 2022-02-10 DIAGNOSIS — M25561 Pain in right knee: Secondary | ICD-10-CM | POA: Diagnosis not present

## 2022-02-10 DIAGNOSIS — M3320 Polymyositis, organ involvement unspecified: Secondary | ICD-10-CM | POA: Diagnosis not present

## 2022-02-13 DIAGNOSIS — M3320 Polymyositis, organ involvement unspecified: Secondary | ICD-10-CM | POA: Diagnosis not present

## 2022-02-13 DIAGNOSIS — Z0389 Encounter for observation for other suspected diseases and conditions ruled out: Secondary | ICD-10-CM | POA: Diagnosis not present

## 2022-03-06 LAB — HM DEXA SCAN

## 2022-03-06 LAB — HM DIABETES FOOT EXAM: HM Diabetic Foot Exam: NORMAL

## 2022-05-03 ENCOUNTER — Other Ambulatory Visit: Payer: Self-pay | Admitting: Nurse Practitioner

## 2022-05-03 DIAGNOSIS — N946 Dysmenorrhea, unspecified: Secondary | ICD-10-CM

## 2022-05-03 NOTE — Telephone Encounter (Signed)
Chart supports Rx Last OV: 10/2021 Next OV: 04/2022 

## 2022-05-08 ENCOUNTER — Ambulatory Visit (INDEPENDENT_AMBULATORY_CARE_PROVIDER_SITE_OTHER): Payer: Medicare Other | Admitting: Nurse Practitioner

## 2022-05-08 ENCOUNTER — Other Ambulatory Visit: Payer: Self-pay | Admitting: Nurse Practitioner

## 2022-05-08 ENCOUNTER — Encounter: Payer: Self-pay | Admitting: Nurse Practitioner

## 2022-05-08 VITALS — BP 126/84 | HR 66 | Temp 97.7°F | Ht 69.0 in | Wt 169.4 lb

## 2022-05-08 DIAGNOSIS — F325 Major depressive disorder, single episode, in full remission: Secondary | ICD-10-CM

## 2022-05-08 DIAGNOSIS — M818 Other osteoporosis without current pathological fracture: Secondary | ICD-10-CM | POA: Diagnosis not present

## 2022-05-08 DIAGNOSIS — Z1231 Encounter for screening mammogram for malignant neoplasm of breast: Secondary | ICD-10-CM

## 2022-05-08 DIAGNOSIS — Z0001 Encounter for general adult medical examination with abnormal findings: Secondary | ICD-10-CM

## 2022-05-08 DIAGNOSIS — E7800 Pure hypercholesterolemia, unspecified: Secondary | ICD-10-CM

## 2022-05-08 DIAGNOSIS — A6000 Herpesviral infection of urogenital system, unspecified: Secondary | ICD-10-CM

## 2022-05-08 LAB — BASIC METABOLIC PANEL
BUN: 10 mg/dL (ref 6–23)
CO2: 24 mEq/L (ref 19–32)
Calcium: 9.5 mg/dL (ref 8.4–10.5)
Chloride: 108 mEq/L (ref 96–112)
Creatinine, Ser: 0.64 mg/dL (ref 0.40–1.20)
GFR: 107.53 mL/min (ref >60.00)
Glucose, Bld: 88 mg/dL (ref 70–99)
Potassium: 4.5 mEq/L (ref 3.5–5.1)
Sodium: 139 mEq/L (ref 135–145)

## 2022-05-08 LAB — LIPID PANEL
Cholesterol: 232 mg/dL — ABNORMAL HIGH (ref 0–200)
HDL: 48.2 mg/dL (ref >39.00)
LDL Cholesterol: 172 mg/dL — ABNORMAL HIGH (ref 0–99)
NonHDL: 183.3
Total CHOL/HDL Ratio: 5
Triglycerides: 59 mg/dL (ref 0.0–149.0)
VLDL: 11.8 mg/dL (ref 0.0–40.0)

## 2022-05-08 MED ORDER — VALACYCLOVIR HCL 500 MG PO TABS: 500.0000 mg | ORAL_TABLET | Freq: Every day | ORAL | 5 refills | Status: DC

## 2022-05-08 MED ORDER — ALENDRONATE SODIUM 70 MG PO TABS: 70.0000 mg | ORAL_TABLET | ORAL | 3 refills | Status: DC

## 2022-05-08 NOTE — Progress Notes (Signed)
Complete physical exam  Patient: [name]   DOB: 1976/06/18   46 y.o. Female  MRN: [id number] Visit Date: 05/08/2022  Subjective:    Chief Complaint  Patient presents with   Annual Exam    CPE Pt fasting  No concerns    [name] is a 46 y.o. female who presents today for a complete physical exam. She reports consuming a general diet.  Resistance training 2-3x/week  She generally feels well. She reports sleeping well. She does not have additional problems to discuss today.  Vision:Yes Dental:Yes STD Screen:No  Most recent fall risk assessment:    05/08/2022    9:52 AM  Fall Risk   Falls in the past year? 0  Number falls in past yr: 0  Injury with Fall? 0   Most recent depression screenings:    05/08/2022   10:20 AM 11/03/2021    9:51 AM  PHQ 2/9 Scores  PHQ - 2 Score 0 0  PHQ- 9 Score 0 0   HPI  Osteoporosis Due to chronic systemic corticosteriod Daily calcium and vit. D OTC dose No adverse effect with fosamax Last dexa scan 2019: Femur Neck Right is 0.745g/cm2 with a Z-score of -2.8. Refill sent Repeat dexa scan  Genital herpes simplex type 2 Episodic outbreak, once a year. Valtrex refill sent  Depression, major, single episode, complete remission (HCC) Stable Continue citalopram at current dose  HYPERCHOLESTEROLEMIA Repeat lipid panel  Past Medical History:  Diagnosis Date   Anxiety    Hyperlipidemia    HYPERTENSION, BENIGN ESSENTIAL 06/16/2008   Qualifier: Diagnosis of  By: Daphine Deutscher FNP, [place]     Polymyositis [facility]) 2009   RLS (restless legs syndrome)    Vitamin D deficiency    Past Surgical History:  Procedure Laterality Date   BRAIN SURGERY     Social History   Socioeconomic History   Marital status: Married    Spouse name: Not on file   Number of children: Not on file   Years of education: Not on file   Highest education level: Not on file  Occupational History   Not on file  Tobacco Use   Smoking status: Former     Types: Cigarettes   Smokeless tobacco: Never  Vaping Use   Vaping Use: Never used  Substance and Sexual Activity   Alcohol use: Yes    Alcohol/week: 2.0 standard drinks of alcohol    Types: 1 Glasses of wine, 1 Shots of liquor per week    Comment: Occasional   Drug use: Yes    Frequency: 2.0 times per week    Types: Marijuana    Comment: Episodic   Sexual activity: Yes    Birth control/protection: None  Other Topics Concern   Not on file  Social History Narrative   Not on file   Social Determinants of Health   Financial Resource Strain: Low Risk  (09/20/2021)   Overall Financial Resource Strain (CARDIA)    Difficulty of Paying Living Expenses: Not hard at all  Food Insecurity: No Food Insecurity (09/20/2021)   Hunger Vital Sign    Worried About Running Out of Food in the Last Year: Never true    Ran Out of Food in the Last Year: Never true  Transportation Needs: No Transportation Needs (09/20/2021)   PRAPARE - Administrator, Civil Service (Medical): No    Lack of Transportation (Non-Medical): No  Physical Activity: Inactive (09/20/2021)   Exercise Vital Sign  Days of Exercise per Week: 0 days    Minutes of Exercise per Session: 0 min  Stress: No Stress Concern Present (09/20/2021)   [employer] of Occupational Health - Occupational Stress Questionnaire    Feeling of Stress : Not at all  Social Connections: Socially Isolated (09/20/2021)   Social Connection and Isolation Panel [NHANES]    Frequency of Communication with Friends and Family: Twice a week    Frequency of Social Gatherings with Friends and Family: Twice a week    Attends Religious Services: Never    Database administrator or Organizations: No    Attends Banker Meetings: Never    Marital Status: Never married  Intimate Partner Violence: Not At Risk (09/20/2021)   Humiliation, Afraid, Rape, and Kick questionnaire    Fear of Current or Ex-Partner: No    Emotionally Abused: No     Physically Abused: No    Sexually Abused: No   Family Status  Relation Name Status   Mother  Alive   Father  Alive   Sister  Alive   Brother  Alive   Nutritional therapist  (Not Specified)   MGM  Deceased   MGF  Deceased   PGM  Alive   PGF  Deceased   Mat Aunt  (Not Specified)   Family History  Problem Relation Age of Onset   Hepatitis Mother    Diabetes Paternal Uncle    Diabetes Maternal Grandmother    Heart disease Paternal Grandfather    Breast cancer Maternal Aunt        ? age of onset   Allergies  Allergen Reactions   Buspirone Nausea Only   Paxil  [Paroxetine Hcl] Nausea Only    Patient Care Team: Corleone Biegler, Bonna Gains, NP as PCP - General (Internal Medicine)   Medications: Outpatient Medications Prior to Visit  Medication Sig   cycloSPORINE (RESTASIS) 0.05 % ophthalmic emulsion 1 drop 2 (two) times daily.   HYDROcodone-acetaminophen (NORCO/VICODIN) 5-325 MG tablet Take 1 tablet by mouth every 6 (six) hours as needed.   ibuprofen (ADVIL) 600 MG tablet TAKE 1 TABLET (600 MG TOTAL) BY MOUTH EVERY 12 (TWELVE) HOURS AS NEEDED (WITH FOOD).   Multiple Vitamins-Minerals (ONE-A-DAY VITACRAVES ADULT) CHEW Chew 1 each by mouth daily.   predniSONE (DELTASONE) 2.5 MG tablet Take 7 mg by mouth.   riTUXimab (RITUXAN) 500 MG/50ML injection Inject 1,000 mg into the vein every 6 (six) months.    [DISCONTINUED] alendronate (FOSAMAX) 70 MG tablet Take 1 tablet (70 mg total) by mouth once a week. Take with a full glass of water on an empty stomach.   [DISCONTINUED] propranolol (INDERAL) 10 MG tablet TAKE 1-2 TABLETS (10-20 MG TOTAL) BY MOUTH DAILY AS NEEDED (ANXIETY).   [DISCONTINUED] valACYclovir (VALTREX) 500 MG tablet Take 1 tablet (500 mg total) by mouth daily. for 3 days at onset of symptoms related with an outbreak   citalopram (CELEXA) 20 MG tablet Take 1 tablet (20 mg total) by mouth daily.   No facility-administered medications prior to visit.    Review of Systems  Constitutional:   Negative for fever.  HENT:  Negative for congestion and sore throat.   Eyes:        Negative for visual changes  Respiratory:  Negative for cough and shortness of breath.   Cardiovascular:  Negative for chest pain, palpitations and leg swelling.  Gastrointestinal:  Negative for blood in stool, constipation and diarrhea.  Genitourinary:  Negative for dysuria, frequency and  urgency.  Musculoskeletal:  Negative for myalgias.  Skin:  Negative for rash.  Neurological:  Negative for dizziness and headaches.  Hematological:  Does not bruise/bleed easily.  Psychiatric/Behavioral:  Negative for suicidal ideas. The patient is not nervous/anxious.         Objective:  BP 126/84 (BP Location: Left Arm, Patient Position: Sitting, Cuff Size: Normal)   Pulse 66   Temp 97.7 F (36.5 C) (Temporal)   Ht 5\' 9"  (1.753 m)   Wt 169 lb 6.4 oz (76.8 kg)   LMP 04/10/2022 (Within Days)   SpO2 95%   BMI 25.02 kg/m       Physical Exam Vitals and nursing note reviewed.  Constitutional:      General: She is not in acute distress.    Appearance: She is well-developed.  HENT:     Right Ear: Tympanic membrane, ear canal and external ear normal.     Left Ear: Tympanic membrane, ear canal and external ear normal.     Nose: Nose normal.  Eyes:     Extraocular Movements: Extraocular movements intact.     Conjunctiva/sclera: Conjunctivae normal.     Pupils: Pupils are equal, round, and reactive to light.  Cardiovascular:     Rate and Rhythm: Normal rate and regular rhythm.     Pulses: Normal pulses.     Heart sounds: Normal heart sounds.  Pulmonary:     Effort: Pulmonary effort is normal. No respiratory distress.     Breath sounds: Normal breath sounds.  Chest:     Chest wall: No tenderness.  Abdominal:     General: Bowel sounds are normal.     Palpations: Abdomen is soft.  Genitourinary:    Comments: Deferred breast and pelvic exam to GYN Musculoskeletal:        General: Normal range of motion.      Cervical back: Normal range of motion and neck supple.     Right lower leg: No edema.     Left lower leg: No edema.  Skin:    General: Skin is warm and dry.  Neurological:     Mental Status: She is alert and oriented to person, place, and time.     Motor: Weakness present.     Coordination: Coordination is intact.     Gait: Gait abnormal.     Deep Tendon Reflexes:     Reflex Scores:      Bicep reflexes are 2+ on the right side and 0 on the left side.      Patellar reflexes are 2+ on the right side and 0 on the left side.    Comments: Left upper arm and left leg weakness  Psychiatric:        Mood and Affect: Mood normal.        Behavior: Behavior normal.        Thought Content: Thought content normal.     No results found for any visits on 05/08/22.    Assessment & Plan:    Routine Health Maintenance and Physical Exam  Immunization History  Administered Date(s) Administered   Influenza Inj Mdck Quad Pf 05/03/2022   Influenza Split 08/29/2013   Influenza,inj,Quad PF,6+ Mos 11/04/2018, 08/26/2019   Influenza-Unspecified 08/11/2020, 04/28/2021, 05/03/2022   PFIZER Comirnaty(Gray Top)Covid-19 Tri-Sucrose Vaccine 11/20/2019, 12/11/2019, 04/28/2020   Tdap 07/20/2017   Health Maintenance  Topic Date Due   COVID-19 Vaccine (4 - Pfizer risk series) 05/24/2022 (Originally 06/23/2020)   PAP SMEAR-Modifier  11/11/2022   TETANUS/TDAP  07/21/2027   INFLUENZA VACCINE  Completed   Hepatitis C Screening  Completed   HIV Screening  Completed   HPV VACCINES  Aged Out   Discussed health benefits of physical activity, and encouraged her to engage in regular exercise appropriate for her age and condition.  Problem List Items Addressed This Visit       Musculoskeletal and Integument   Osteoporosis    Due to chronic systemic corticosteriod Daily calcium and vit. D OTC dose No adverse effect with fosamax Last dexa scan 2019: Femur Neck Right is 0.745g/cm2 with a Z-score of  -2.8. Refill sent Repeat dexa scan      Relevant Medications   alendronate (FOSAMAX) 70 MG tablet   Other Relevant Orders   DG Bone Density     Genitourinary   Genital herpes simplex type 2    Episodic outbreak, once a year. Valtrex refill sent      Relevant Medications   valACYclovir (VALTREX) 500 MG tablet     Other   Depression, major, single episode, complete remission (HCC)    Stable Continue citalopram at current dose      Relevant Orders   Basic metabolic panel   HYPERCHOLESTEROLEMIA    Repeat lipid panel      Relevant Orders   Lipid panel   Other Visit Diagnoses     Encounter for preventative adult health care exam with abnormal findings    -  Primary      Return in about 1 year (around 05/09/2023) for CPE (fasting).     Alysia Penna, NP

## 2022-05-08 NOTE — Assessment & Plan Note (Signed)
Repeat lipid panel ?

## 2022-05-08 NOTE — Assessment & Plan Note (Signed)
Stable Continue citalopram at current dose

## 2022-05-08 NOTE — Assessment & Plan Note (Signed)
Episodic outbreak, once a year. Valtrex refill sent

## 2022-05-08 NOTE — Patient Instructions (Signed)
Go to lab  Preventive Care 44-44 Years Old, Female Preventive care refers to lifestyle choices and visits with your health care provider that can promote health and wellness. Preventive care visits are also called wellness exams. What can I expect for my preventive care visit? Counseling Your health care provider may ask you questions about your: Medical history, including: Past medical problems. Family medical history. Pregnancy history. Current health, including: Menstrual cycle. Method of birth control. Emotional well-being. Home life and relationship well-being. Sexual activity and sexual health. Lifestyle, including: Alcohol, nicotine or tobacco, and drug use. Access to firearms. Diet, exercise, and sleep habits. Work and work Statistician. Sunscreen use. Safety issues such as seatbelt and bike helmet use. Physical exam Your health care provider will check your: Height and weight. These may be used to calculate your BMI (body mass index). BMI is a measurement that tells if you are at a healthy weight. Waist circumference. This measures the distance around your waistline. This measurement also tells if you are at a healthy weight and may help predict your risk of certain diseases, such as type 2 diabetes and high blood pressure. Heart rate and blood pressure. Body temperature. Skin for abnormal spots. What immunizations do I need?  Vaccines are usually given at various ages, according to a schedule. Your health care provider will recommend vaccines for you based on your age, medical history, and lifestyle or other factors, such as travel or where you work. What tests do I need? Screening Your health care provider may recommend screening tests for certain conditions. This may include: Lipid and cholesterol levels. Diabetes screening. This is done by checking your blood sugar (glucose) after you have not eaten for a while (fasting). Pelvic exam and Pap test. Hepatitis B  test. Hepatitis C test. HIV (human immunodeficiency virus) test. STI (sexually transmitted infection) testing, if you are at risk. Lung cancer screening. Colorectal cancer screening. Mammogram. Talk with your health care provider about when you should start having regular mammograms. This may depend on whether you have a family history of breast cancer. BRCA-related cancer screening. This may be done if you have a family history of breast, ovarian, tubal, or peritoneal cancers. Bone density scan. This is done to screen for osteoporosis. Talk with your health care provider about your test results, treatment options, and if necessary, the need for more tests. Follow these instructions at home: Eating and drinking  Eat a diet that includes fresh fruits and vegetables, whole grains, lean protein, and low-fat dairy products. Take vitamin and mineral supplements as recommended by your health care provider. Do not drink alcohol if: Your health care provider tells you not to drink. You are pregnant, may be pregnant, or are planning to become pregnant. If you drink alcohol: Limit how much you have to 0-1 drink a day. Know how much alcohol is in your drink. In the [place] one drink equals one 12 oz bottle of beer (355 mL), one 5 oz glass of wine (148 mL), or one 1 oz glass of hard liquor (44 mL). Lifestyle Brush your teeth every morning and night with fluoride toothpaste. Floss one time each day. Exercise for at least 30 minutes 5 or more days each week. Do not use any products that contain nicotine or tobacco. These products include cigarettes, chewing tobacco, and vaping devices, such as e-cigarettes. If you need help quitting, ask your health care provider. Do not use drugs. If you are sexually active, practice safe sex. Use a condom or other  form of protection to prevent STIs. If you do not wish to become pregnant, use a form of birth control. If you plan to become pregnant, see your health care  provider for a prepregnancy visit. Take aspirin only as told by your health care provider. Make sure that you understand how much to take and what form to take. Work with your health care provider to find out whether it is safe and beneficial for you to take aspirin daily. Find healthy ways to manage stress, such as: Meditation, yoga, or listening to music. Journaling. Talking to a trusted person. Spending time with friends and family. Minimize exposure to UV radiation to reduce your risk of skin cancer. Safety Always wear your seat belt while driving or riding in a vehicle. Do not drive: If you have been drinking alcohol. Do not ride with someone who has been drinking. When you are tired or distracted. While texting. If you have been using any mind-altering substances or drugs. Wear a helmet and other protective equipment during sports activities. If you have firearms in your house, make sure you follow all gun safety procedures. Seek help if you have been physically or sexually abused. What's next? Visit your health care provider once a year for an annual wellness visit. Ask your health care provider how often you should have your eyes and teeth checked. Stay up to date on all vaccines. This information is not intended to replace advice given to you by your health care provider. Make sure you discuss any questions you have with your health care provider. Document Revised: 02/09/2021 Document Reviewed: 02/09/2021 Elsevier Patient Education  [place].

## 2022-05-08 NOTE — Assessment & Plan Note (Addendum)
Due to chronic systemic corticosteriod Daily calcium and vit. D OTC dose No adverse effect with fosamax Last dexa scan 2019: Femur Neck Right is 0.745g/cm2 with a Z-score of -2.8. Refill sent Repeat dexa scan

## 2022-06-08 ENCOUNTER — Ambulatory Visit: Payer: Medicare Other

## 2022-06-21 ENCOUNTER — Ambulatory Visit
Admission: RE | Admit: 2022-06-21 | Discharge: 2022-06-21 | Disposition: A | Discharge status: Home / Self Care | Payer: Medicare Other | Source: Ambulatory Visit | Attending: Nurse Practitioner | Admitting: Nurse Practitioner

## 2022-06-21 DIAGNOSIS — Z1231 Encounter for screening mammogram for malignant neoplasm of breast: Secondary | ICD-10-CM | POA: Diagnosis not present

## 2022-08-03 ENCOUNTER — Other Ambulatory Visit: Payer: Self-pay | Admitting: Nurse Practitioner

## 2022-08-03 DIAGNOSIS — N946 Dysmenorrhea, unspecified: Secondary | ICD-10-CM

## 2022-08-03 NOTE — Telephone Encounter (Signed)
Chart supports Rx Last OV: 04/2022 Next OV: 04/2023

## 2022-09-07 DIAGNOSIS — Z7952 Long term (current) use of systemic steroids: Secondary | ICD-10-CM | POA: Diagnosis not present

## 2022-09-07 DIAGNOSIS — M3320 Polymyositis, organ involvement unspecified: Secondary | ICD-10-CM | POA: Diagnosis not present

## 2022-09-07 DIAGNOSIS — Z7983 Long term (current) use of bisphosphonates: Secondary | ICD-10-CM | POA: Diagnosis not present

## 2022-09-20 DIAGNOSIS — Z7952 Long term (current) use of systemic steroids: Secondary | ICD-10-CM | POA: Diagnosis not present

## 2022-09-20 DIAGNOSIS — Z0389 Encounter for observation for other suspected diseases and conditions ruled out: Secondary | ICD-10-CM | POA: Diagnosis not present

## 2022-09-20 DIAGNOSIS — Z7983 Long term (current) use of bisphosphonates: Secondary | ICD-10-CM | POA: Diagnosis not present

## 2022-09-20 DIAGNOSIS — M3320 Polymyositis, organ involvement unspecified: Secondary | ICD-10-CM | POA: Diagnosis not present

## 2022-09-22 ENCOUNTER — Ambulatory Visit (INDEPENDENT_AMBULATORY_CARE_PROVIDER_SITE_OTHER): Payer: 59

## 2022-09-22 VITALS — Ht 69.0 in | Wt 170.0 lb

## 2022-09-22 DIAGNOSIS — Z0000 Encounter for general adult medical examination without abnormal findings: Secondary | ICD-10-CM

## 2022-09-22 NOTE — Patient Instructions (Addendum)
[name] , Thank you for taking time to come for your Medicare Wellness Visit. I appreciate your ongoing commitment to your health goals. Please review the following plan we discussed and let me know if I can assist you in the future.   These are the goals we discussed:  Goals      Patient Stated     Keep blood pressure down     Patient Stated     09/22/2022, wants to get stronger        This is a list of the screening recommended for you and due dates:  Health Maintenance  Topic Date Due   COVID-19 Vaccine (4 - 2023-24 season) 04/28/2022   Pap Smear  11/11/2022   Medicare Annual Wellness Visit  09/23/2023   DTaP/Tdap/Td vaccine (2 - Td or Tdap) 07/21/2027   Flu Shot  Completed   Hepatitis C Screening: USPSTF Recommendation to screen - Ages 18-79 yo.  Completed   HIV Screening  Completed   HPV Vaccine  Aged Out    Advanced directives: Advance directive discussed with you today. .  Conditions/risks identified: none  Next appointment: Follow up in one year for your annual wellness visit.   Preventive Care 40-64 Years, Female Preventive care refers to lifestyle choices and visits with your health care provider that can promote health and wellness. What does preventive care include? A yearly physical exam. This is also called an annual well check. Dental exams once or twice a year. Routine eye exams. Ask your health care provider how often you should have your eyes checked. Personal lifestyle choices, including: Daily care of your teeth and gums. Regular physical activity. Eating a healthy diet. Avoiding tobacco and drug use. Limiting alcohol use. Practicing safe sex. Taking low-dose aspirin daily starting at age 24. Taking vitamin and mineral supplements as recommended by your health care provider. What happens during an annual well check? The services and screenings done by your health care provider during your annual well check will depend on your age, overall  health, lifestyle risk factors, and family history of disease. Counseling  Your health care provider may ask you questions about your: Alcohol use. Tobacco use. Drug use. Emotional well-being. Home and relationship well-being. Sexual activity. Eating habits. Work and work Astronomer. Method of birth control. Menstrual cycle. Pregnancy history. Screening  You may have the following tests or measurements: Height, weight, and BMI. Blood pressure. Lipid and cholesterol levels. These may be checked every 5 years, or more frequently if you are over 79 years old. Skin check. Lung cancer screening. You may have this screening every year starting at age 30 if you have a 30-pack-year history of smoking and currently smoke or have quit within the past 15 years. Fecal occult blood test (FOBT) of the stool. You may have this test every year starting at age 93. Flexible sigmoidoscopy or colonoscopy. You may have a sigmoidoscopy every 5 years or a colonoscopy every 10 years starting at age 52. Hepatitis C blood test. Hepatitis B blood test. Sexually transmitted disease (STD) testing. Diabetes screening. This is done by checking your blood sugar (glucose) after you have not eaten for a while (fasting). You may have this done every 1-3 years. Mammogram. This may be done every 1-2 years. Talk to your health care provider about when you should start having regular mammograms. This may depend on whether you have a family history of breast cancer. BRCA-related cancer screening. This may be done if you have a family  history of breast, ovarian, tubal, or peritoneal cancers. Pelvic exam and Pap test. This may be done every 3 years starting at age 65. Starting at age 58, this may be done every 5 years if you have a Pap test in combination with an HPV test. Bone density scan. This is done to screen for osteoporosis. You may have this scan if you are at high risk for osteoporosis. Discuss your test results,  treatment options, and if necessary, the need for more tests with your health care provider. Vaccines  Your health care provider may recommend certain vaccines, such as: Influenza vaccine. This is recommended every year. Tetanus, diphtheria, and acellular pertussis (Tdap, Td) vaccine. You may need a Td booster every 10 years. Zoster vaccine. You may need this after age 1. Pneumococcal 13-valent conjugate (PCV13) vaccine. You may need this if you have certain conditions and were not previously vaccinated. Pneumococcal polysaccharide (PPSV23) vaccine. You may need one or two doses if you smoke cigarettes or if you have certain conditions. Talk to your health care provider about which screenings and vaccines you need and how often you need them. This information is not intended to replace advice given to you by your health care provider. Make sure you discuss any questions you have with your health care provider. Document Released: 09/10/2015 Document Revised: 05/03/2016 Document Reviewed: 06/15/2015 Elsevier Interactive Patient Education  2017 [place] Prevention in the Home Falls can cause injuries. They can happen to people of all ages. There are many things you can do to make your home safe and to help prevent falls. What can I do on the outside of my home? Regularly fix the edges of walkways and driveways and fix any cracks. Remove anything that might make you trip as you walk through a door, such as a raised step or threshold. Trim any bushes or trees on the path to your home. Use bright outdoor lighting. Clear any walking paths of anything that might make someone trip, such as rocks or tools. Regularly check to see if handrails are loose or broken. Make sure that both sides of any steps have handrails. Any raised decks and porches should have guardrails on the edges. Have any leaves, snow, or ice cleared regularly. Use sand or salt on walking paths during winter. Clean  up any spills in your garage right away. This includes oil or grease spills. What can I do in the bathroom? Use night lights. Install grab bars by the toilet and in the tub and shower. Do not use towel bars as grab bars. Use non-skid mats or decals in the tub or shower. If you need to sit down in the shower, use a plastic, non-slip stool. Keep the floor dry. Clean up any water that spills on the floor as soon as it happens. Remove soap buildup in the tub or shower regularly. Attach bath mats securely with double-sided non-slip rug tape. Do not have throw rugs and other things on the floor that can make you trip. What can I do in the bedroom? Use night lights. Make sure that you have a light by your bed that is easy to reach. Do not use any sheets or blankets that are too big for your bed. They should not hang down onto the floor. Have a firm chair that has side arms. You can use this for support while you get dressed. Do not have throw rugs and other things on the floor that can make you  trip. What can I do in the kitchen? Clean up any spills right away. Avoid walking on wet floors. Keep items that you use a lot in easy-to-reach places. If you need to reach something above you, use a strong step stool that has a grab bar. Keep electrical cords out of the way. Do not use floor polish or wax that makes floors slippery. If you must use wax, use non-skid floor wax. Do not have throw rugs and other things on the floor that can make you trip. What can I do with my stairs? Do not leave any items on the stairs. Make sure that there are handrails on both sides of the stairs and use them. Fix handrails that are broken or loose. Make sure that handrails are as long as the stairways. Check any carpeting to make sure that it is firmly attached to the stairs. Fix any carpet that is loose or worn. Avoid having throw rugs at the top or bottom of the stairs. If you do have throw rugs, attach them to the  floor with carpet tape. Make sure that you have a light switch at the top of the stairs and the bottom of the stairs. If you do not have them, ask someone to add them for you. What else can I do to help prevent falls? Wear shoes that: Do not have high heels. Have rubber bottoms. Are comfortable and fit you well. Are closed at the toe. Do not wear sandals. If you use a stepladder: Make sure that it is fully opened. Do not climb a closed stepladder. Make sure that both sides of the stepladder are locked into place. Ask someone to hold it for you, if possible. Clearly mark and make sure that you can see: Any grab bars or handrails. First and last steps. Where the edge of each step is. Use tools that help you move around (mobility aids) if they are needed. These include: Canes. Walkers. Scooters. Crutches. Turn on the lights when you go into a dark area. Replace any light bulbs as soon as they burn out. Set up your furniture so you have a clear path. Avoid moving your furniture around. If any of your floors are uneven, fix them. If there are any pets around you, be aware of where they are. Review your medicines with your doctor. Some medicines can make you feel dizzy. This can increase your chance of falling. Ask your doctor what other things that you can do to help prevent falls. This information is not intended to replace advice given to you by your health care provider. Make sure you discuss any questions you have with your health care provider. Document Released: 06/10/2009 Document Revised: 01/20/2016 Document Reviewed: 09/18/2014 Elsevier Interactive Patient Education  2017 [employer].

## 2022-09-22 NOTE — Progress Notes (Signed)
I connected with [name] today by telephone and verified that I am speaking with the correct person using two identifiers. Location patient: home Location provider: work Persons participating in the virtual visit: [name], [name].   I discussed the limitations, risks, security and privacy concerns of performing an evaluation and management service by telephone and the availability of in person appointments. I also discussed with the patient that there may be a patient responsible charge related to this service. The patient expressed understanding and verbally consented to this telephonic visit.    Interactive audio and video telecommunications were attempted between this provider and patient, however failed, due to patient having technical difficulties OR patient did not have access to video capability.  We continued and completed visit with audio only.     Vital signs may be patient reported or missing.  Subjective:   [name] is a 47 y.o. female who presents for Medicare Annual (Subsequent) preventive examination.  Review of Systems     Cardiac Risk Factors include: none     Objective:    Today's Vitals   09/22/22 1440 09/22/22 1441  Weight: 170 lb (77.1 kg)   Height: 5\' 9"  (1.753 m)   PainSc:  3    Body mass index is 25.1 kg/m.     09/22/2022    2:45 PM 09/20/2021    3:50 PM 10/31/2019   10:29 AM 04/23/2019    2:06 PM 04/23/2017    5:00 AM 04/22/2017    4:31 PM 04/22/2017   10:45 AM  Advanced Directives  Does Patient Have a Medical Advance Directive? No No Yes No  No No  Does patient want to make changes to medical advance directive?   Yes (ED - Information included in AVS)      Would patient like information on creating a medical advance directive?  No - Patient declined  No - Patient declined        Information is confidential and restricted. Go to Review Flowsheets to unlock data.    Current Medications (verified) Outpatient Encounter  Medications as of 09/22/2022  Medication Sig   alendronate (FOSAMAX) 70 MG tablet Take 1 tablet (70 mg total) by mouth once a week. Take with a full glass of water on an empty stomach.   Cholecalciferol (VITAMIN D3) 25 MCG (1000 UT) CAPS Take by mouth.   cycloSPORINE (RESTASIS) 0.05 % ophthalmic emulsion 1 drop 2 (two) times daily.   HYDROcodone-acetaminophen (NORCO/VICODIN) 5-325 MG tablet Take 1 tablet by mouth every 6 (six) hours as needed.   ibuprofen (ADVIL) 600 MG tablet TAKE 1 TABLET (600 MG TOTAL) BY MOUTH EVERY 12 (TWELVE) HOURS AS NEEDED (WITH FOOD).   Multiple Vitamins-Minerals (ONE-A-DAY VITACRAVES ADULT) CHEW Chew 1 each by mouth daily.   predniSONE (DELTASONE) 2.5 MG tablet Take 7 mg by mouth.   riTUXimab (RITUXAN) 500 MG/50ML injection Inject 1,000 mg into the vein every 6 (six) months.    valACYclovir (VALTREX) 500 MG tablet Take 1 tablet (500 mg total) by mouth daily. for 3 days at onset of symptoms related with an outbreak   citalopram (CELEXA) 20 MG tablet Take 1 tablet (20 mg total) by mouth daily.   No facility-administered encounter medications on file as of 09/22/2022.    Allergies (verified) Buspirone and Paxil  [paroxetine hcl]   History: Past Medical History:  Diagnosis Date   Anxiety    Hyperlipidemia    HYPERTENSION, BENIGN ESSENTIAL 06/16/2008   Qualifier: Diagnosis of  By: Hassell Done FNP, Nykedtra     Polymyositis ([place]) 2009   RLS (restless legs syndrome)    Vitamin D deficiency    Past Surgical History:  Procedure Laterality Date   BRAIN SURGERY     Family History  Problem Relation Age of Onset   Hepatitis Mother    Diabetes Paternal Uncle    Diabetes Maternal Grandmother    Heart disease Paternal Grandfather    Breast cancer Maternal Aunt        ? age of onset   Social History   Socioeconomic History   Marital status: Married    Spouse name: Not on file   Number of children: Not on file   Years of education: Not on file   Highest education  level: Not on file  Occupational History   Not on file  Tobacco Use   Smoking status: Former    Types: Cigarettes   Smokeless tobacco: Never  Vaping Use   Vaping Use: Never used  Substance and Sexual Activity   Alcohol use: Yes    Alcohol/week: 2.0 standard drinks of alcohol    Types: 1 Glasses of wine, 1 Shots of liquor per week    Comment: Occasional   Drug use: Yes    Frequency: 2.0 times per week    Types: Marijuana    Comment: Episodic   Sexual activity: Yes    Birth control/protection: None  Other Topics Concern   Not on file  Social History Narrative   Not on file   Social Determinants of Health   Financial Resource Strain: Low Risk  (09/22/2022)   Overall Financial Resource Strain (CARDIA)    Difficulty of Paying Living Expenses: Not hard at all  Food Insecurity: No Food Insecurity (09/22/2022)   Hunger Vital Sign    Worried About Running Out of Food in the Last Year: Never true    Ran Out of Food in the Last Year: Never true  Transportation Needs: No Transportation Needs (09/22/2022)   PRAPARE - Hydrologist (Medical): No    Lack of Transportation (Non-Medical): No  Physical Activity: Insufficiently Active (09/22/2022)   Exercise Vital Sign    Days of Exercise per Week: 3 days    Minutes of Exercise per Session: 30 min  Stress: No Stress Concern Present (09/22/2022)   [place]    Feeling of Stress : Not at all  Social Connections: Socially Isolated (09/20/2021)   Social Connection and Isolation Panel [NHANES]    Frequency of Communication with Friends and Family: Twice a week    Frequency of Social Gatherings with Friends and Family: Twice a week    Attends Religious Services: Never    Marine scientist or Organizations: No    Attends Music therapist: Never    Marital Status: Never married    Tobacco Counseling Counseling given: Not  Answered   Clinical Intake:  Pre-visit preparation completed: Yes  Pain : 0-10 Pain Score: 3  Pain Type: Chronic pain Pain Location: Knee Pain Orientation: Left, Right Pain Descriptors / Indicators: Aching Pain Onset: More than a month ago Pain Frequency: Constant     Nutritional Status: BMI 25 -29 Overweight Nutritional Risks: None Diabetes: No  How often do you need to have someone help you when you read instructions, pamphlets, or other written materials from your doctor or pharmacy?: 1 - Never  Diabetic? no  Interpreter Needed?: No  Information entered by :: NAllen Potter   Activities of Daily Living    09/22/2022    2:47 PM 09/21/2022    3:57 PM  In your present state of health, do you have any difficulty performing the following activities:  Hearing? 0 0  Vision? 0 0  Difficulty concentrating or making decisions? 0 0  Walking or climbing stairs? 1 1  Dressing or bathing? 0 0  Doing errands, shopping? 0 0  Preparing Food and eating ? N N  Using the Toilet? N N  In the past six months, have you accidently leaked urine? N N  Do you have problems with loss of bowel control? N N  Managing your Medications? N N  Managing your Finances? N N  Housekeeping or managing your Housekeeping? N N    Patient Care Team: Nche, Bonna Gains, NP as PCP - General (Internal Medicine)  Indicate any recent Medical Services you may have received from other than [facility] providers in the past year (date may be approximate).     Assessment:   This is a routine wellness examination for Rateel.  Hearing/Vision screen Vision Screening - Comments:: Regular eye exams, [facility]  Dietary issues and exercise activities discussed: Current Exercise Habits: Home exercise routine, Type of exercise: treadmill, Time (Minutes): 30, Frequency (Times/Week): 3, Weekly Exercise (Minutes/Week): 90   Goals Addressed             This Visit's Progress    Patient Stated       09/22/2022, wants to  get stronger       Depression Screen    09/22/2022    2:47 PM 05/08/2022   10:20 AM 11/03/2021    9:51 AM 09/20/2021    3:53 PM 09/20/2021    3:49 PM 05/06/2021   10:35 AM 05/06/2021   10:32 AM  PHQ 2/9 Scores  PHQ - 2 Score 0 0 0 0 0 0 0  PHQ- 9 Score  0 0        Fall Risk    09/22/2022    2:46 PM 09/21/2022    3:57 PM 05/08/2022    9:52 AM 09/20/2021    3:50 PM 05/06/2021   10:31 AM  Fall Risk   Falls in the past year? 1 1 0 0 1  Comment leg gave out      Number falls in past yr: 0 0 0 0 0  Injury with Fall? 0 1 0 0 1  Risk for fall due to : Impaired balance/gait;Impaired mobility;Medication side effect   No Fall Risks History of fall(s)  Risk for fall due to: Comment    cane   Follow up Falls prevention discussed;Education provided;Falls evaluation completed   Falls evaluation completed Falls evaluation completed    FALL RISK PREVENTION PERTAINING TO THE HOME:  Any stairs in or around the home? No  If so, are there any without handrails? N/a Home free of loose throw rugs in walkways, pet beds, electrical cords, etc? Yes  Adequate lighting in your home to reduce risk of falls? Yes   ASSISTIVE DEVICES UTILIZED TO PREVENT FALLS:  Life alert? No  Use of a cane, walker or w/c? Yes  Grab bars in the bathroom? No  Shower chair or bench in shower? Yes  Elevated toilet seat or a handicapped toilet? Yes   TIMED UP AND GO:  Was the test performed? No .      Cognitive Function:        09/22/2022  2:48 PM 10/31/2019   10:25 AM 04/23/2019    2:09 PM 04/23/2019    2:07 PM  6CIT Screen  What Year? 0 points 0 points 0 points 0 points  What month? 0 points 0 points 0 points 0 points  What time? 0 points 0 points 0 points 0 points  Count back from 20 0 points 0 points 0 points 0 points  Months in reverse 0 points 0 points 0 points 0 points  Repeat phrase 0 points 0 points 0 points   Total Score 0 points 0 points 0 points     Immunizations Immunization History   Administered Date(s) Administered   Influenza Inj Mdck Quad Pf 05/03/2022   Influenza Split 08/29/2013   Influenza,inj,Quad PF,6+ Mos 11/04/2018, 08/26/2019   Influenza-Unspecified 08/11/2020, 04/28/2021, 05/03/2022   PFIZER Comirnaty(Gray Top)Covid-19 Tri-Sucrose Vaccine 11/20/2019, 12/11/2019, 04/28/2020   Tdap 07/20/2017    TDAP status: Up to date  Flu Vaccine status: Up to date  Pneumococcal vaccine status: Up to date  Covid-19 vaccine status: Completed vaccines  Qualifies for Shingles Vaccine? No   Zostavax completed  n/a   Shingrix Completed?: n/a  Screening Tests Health Maintenance  Topic Date Due   COVID-19 Vaccine (4 - 2023-24 season) 04/28/2022   Medicare Annual Wellness (AWV)  09/20/2022   PAP SMEAR-Modifier  11/11/2022   DTaP/Tdap/Td (2 - Td or Tdap) 07/21/2027   INFLUENZA VACCINE  Completed   Hepatitis C Screening  Completed   HIV Screening  Completed   HPV VACCINES  Aged Out    Health Maintenance  Health Maintenance Due  Topic Date Due   COVID-19 Vaccine (4 - 2023-24 season) 04/28/2022   Medicare Annual Wellness (AWV)  09/20/2022   PAP SMEAR-Modifier  11/11/2022    Colorectal cancer screening: n/a  Mammogram status: Completed 06/21/2022. Repeat every year  Bone Density status: n/a  Lung Cancer Screening: (Low Dose CT Chest recommended if Age 2-80 years, 30 pack-year currently smoking OR have quit w/in 15years.) does not qualify.   Lung Cancer Screening Referral: no  Additional Screening:  Hepatitis C Screening: does qualify; Completed 10/26/2020  Vision Screening: Recommended annual ophthalmology exams for early detection of glaucoma and other disorders of the eye. Is the patient up to date with their annual eye exam?  Yes  Who is the provider or what is the name of the office in which the patient attends annual eye exams? [facility] If pt is not established with a provider, would they like to be referred to a provider to establish care? No .    Dental Screening: Recommended annual dental exams for proper oral hygiene  Community Resource Referral / Chronic Care Management: CRR required this visit?  No   CCM required this visit?  No      Plan:     I have personally reviewed and noted the following in the patient's chart:   Medical and social history Use of alcohol, tobacco or illicit drugs  Current medications and supplements including opioid prescriptions. Patient is currently taking opioid prescriptions. Information provided to patient regarding non-opioid alternatives. Patient advised to discuss non-opioid treatment plan with their provider. Functional ability and status Nutritional status Physical activity Advanced directives List of other physicians Hospitalizations, surgeries, and ER visits in previous 12 months Vitals Screenings to include cognitive, depression, and falls Referrals and appointments  In addition, I have reviewed and discussed with patient certain preventive protocols, quality metrics, and best practice recommendations. A written personalized care plan for preventive services as  well as general preventive health recommendations were provided to patient.     Kellie Simmering, Potter   12/19/5359   Nurse Notes: none  Due to this being a virtual visit, the after visit summary with patients personalized plan was offered to patient via mail or my-chart.  Patient would like to access on my-chart

## 2022-10-02 DIAGNOSIS — H5213 Myopia, bilateral: Secondary | ICD-10-CM | POA: Diagnosis not present

## 2022-10-31 ENCOUNTER — Ambulatory Visit
Admission: RE | Admit: 2022-10-31 | Discharge: 2022-10-31 | Disposition: A | Discharge status: Home / Self Care | Payer: 59 | Source: Ambulatory Visit | Attending: Nurse Practitioner | Admitting: Nurse Practitioner

## 2022-10-31 DIAGNOSIS — M818 Other osteoporosis without current pathological fracture: Secondary | ICD-10-CM

## 2022-10-31 DIAGNOSIS — M85851 Other specified disorders of bone density and structure, right thigh: Secondary | ICD-10-CM | POA: Diagnosis not present

## 2022-11-01 ENCOUNTER — Other Ambulatory Visit: Payer: Self-pay | Admitting: Nurse Practitioner

## 2022-11-01 DIAGNOSIS — A6000 Herpesviral infection of urogenital system, unspecified: Secondary | ICD-10-CM

## 2022-11-02 DIAGNOSIS — H5213 Myopia, bilateral: Secondary | ICD-10-CM | POA: Diagnosis not present

## 2022-11-25 ENCOUNTER — Other Ambulatory Visit: Payer: Self-pay | Admitting: Nurse Practitioner

## 2022-11-25 DIAGNOSIS — F325 Major depressive disorder, single episode, in full remission: Secondary | ICD-10-CM

## 2023-01-15 ENCOUNTER — Ambulatory Visit (INDEPENDENT_AMBULATORY_CARE_PROVIDER_SITE_OTHER): Payer: 59 | Admitting: Podiatry

## 2023-01-15 DIAGNOSIS — B351 Tinea unguium: Secondary | ICD-10-CM | POA: Diagnosis not present

## 2023-01-15 DIAGNOSIS — M79675 Pain in left toe(s): Secondary | ICD-10-CM | POA: Diagnosis not present

## 2023-01-15 NOTE — Patient Instructions (Signed)

## 2023-01-15 NOTE — Progress Notes (Signed)
   Chief Complaint  Patient presents with   Nail Problem    Patient came in today for left foot hallux ingrown, started a week ago, no drainage, no swelling     HPI: 47 y.o. female presenting today for new complaint of pain and tenderness associated to the left hallux nail plate.  Patient states that back in November 2023 she stubbed her toe.  Slowly she has developed some pain and tenderness associated to the nail plate of the left hallux.  Presenting for further treatment and evaluation  Past Medical History:  Diagnosis Date   Anxiety    Hyperlipidemia    HYPERTENSION, BENIGN ESSENTIAL 06/16/2008   Qualifier: Diagnosis of  By: Daphine Deutscher FNP, Nykedtra     Polymyositis [facility]) 2009   RLS (restless legs syndrome)    Vitamin D deficiency     Past Surgical History:  Procedure Laterality Date   BRAIN SURGERY      Allergies  Allergen Reactions   Buspirone Nausea Only   Paxil  [Paroxetine Hcl] Nausea Only     Physical Exam: General: The patient is alert and oriented x3 in no acute distress.  Dermatology: Skin is warm, dry and supple bilateral lower extremities.  There is some hyperkeratotic tissue and subungual debris noted to the distal nail plate of the left hallux.  This correlates directly with the patient's symptoms  Vascular: Palpable pedal pulses bilaterally. Capillary refill within normal limits.  No appreciable edema.  No erythema.  Neurological: Grossly intact via light touch  Musculoskeletal Exam: No pedal deformities noted  Assessment/Plan of Care: 1.  Symptomatic toenail left hallux nail plate with subungual debris  -Light debridement of the distal portion of the nail plate as well as the subungual debris was performed today using a tissue nipper without incident or bleeding.  Patient felt a significant amount of relief -Continue wearing good supportive shoes and sneakers.  Advised against going barefoot -Return to clinic as needed       Felecia Shelling,  DPM [facility]  Dr. Felecia Shelling, DPM    [address] [place], [place] [address]                Office [phone]  Fax [phone]

## 2023-02-08 DIAGNOSIS — M3320 Polymyositis, organ involvement unspecified: Secondary | ICD-10-CM | POA: Diagnosis not present

## 2023-03-06 ENCOUNTER — Encounter: Payer: Self-pay | Admitting: Internal Medicine

## 2023-03-06 ENCOUNTER — Ambulatory Visit (INDEPENDENT_AMBULATORY_CARE_PROVIDER_SITE_OTHER): Payer: 59 | Admitting: Internal Medicine

## 2023-03-06 VITALS — BP 122/80 | HR 73 | Temp 97.8°F | Ht 69.0 in | Wt 165.0 lb

## 2023-03-06 DIAGNOSIS — H66003 Acute suppurative otitis media without spontaneous rupture of ear drum, bilateral: Secondary | ICD-10-CM | POA: Diagnosis not present

## 2023-03-06 MED ORDER — AMOXICILLIN-POT CLAVULANATE 875-125 MG PO TABS: 1.0000 | ORAL_TABLET | Freq: Two times a day (BID) | ORAL | 0 refills | Status: AC

## 2023-03-06 NOTE — Progress Notes (Signed)
[facility] PRIMARY CARE [facility] [address] [place] [address] Dept: [phone] Dept Fax: [phone]  Acute Care Office Visit  Subjective:   [name] [birth year] 03/06/2023  Chief Complaint  Patient presents with   Ear Pain    Started two days ago taking ibuprofen      HPI: Discussed the use of AI scribe software for clinical note transcription with the patient, who gave verbal consent to proceed.  History of Present Illness   The patient presents with a two-day history of left earache, described as similar to a toothache. The pain is severe enough to disrupt sleep and is only temporarily relieved by ibuprofen. She denies fever, chills, nasal congestion, cough, and sore throat, but reports a runny nose. The patient has a history of an immune disease, specifically polymyositis, for which she receives Rituxan treatments at a local hospital.      The following portions of the patient's history were reviewed and updated as appropriate: past medical history, past surgical history, family history, social history, allergies, medications, and problem list.   Patient Active Problem List   Diagnosis Date Noted   Gastroesophageal reflux disease 05/06/2021   GAD (generalized anxiety disorder) 01/06/2021   Depression, major, single episode, complete remission (HCC) 01/06/2021   RLS (restless legs syndrome) 11/07/2018   Long term systemic steroid user 11/07/2018   Vitamin D deficiency 02/21/2017   Genital herpes simplex type 2 02/21/2017   Osteoporosis 01/30/2014   HYPERCHOLESTEROLEMIA 01/28/2009   RENAL CALCULUS, RIGHT 06/18/2008   Polymyositis (HCC) 06/16/2008   CHIARI MALFORMATION 11/20/2007   Left-sided weakness 11/18/2007   Past Medical History:  Diagnosis Date   Anxiety    Hyperlipidemia    HYPERTENSION, BENIGN ESSENTIAL 06/16/2008   Qualifier: Diagnosis of  By: Daphine Deutscher FNP, Nykedtra     Polymyositis ([facility]) 2009   RLS (restless  legs syndrome)    Vitamin D deficiency    Past Surgical History:  Procedure Laterality Date   BRAIN SURGERY     Family History  Problem Relation Age of Onset   Hepatitis Mother    Diabetes Paternal Uncle    Diabetes Maternal Grandmother    Heart disease Paternal Grandfather    Breast cancer Maternal Aunt        ? age of onset   Outpatient Medications Prior to Visit  Medication Sig Dispense Refill   alendronate (FOSAMAX) 70 MG tablet Take 1 tablet (70 mg total) by mouth once a week. Take with a full glass of water on an empty stomach. 12 tablet 3   Cholecalciferol (VITAMIN D3) 25 MCG (1000 UT) CAPS Take by mouth.     citalopram (CELEXA) 20 MG tablet TAKE 1 TABLET BY MOUTH EVERY DAY 90 tablet 3   cycloSPORINE (RESTASIS) 0.05 % ophthalmic emulsion 1 drop 2 (two) times daily.     HYDROcodone-acetaminophen (NORCO/VICODIN) 5-325 MG tablet Take 1 tablet by mouth every 6 (six) hours as needed. 60 tablet 0   ibuprofen (ADVIL) 600 MG tablet TAKE 1 TABLET (600 MG TOTAL) BY MOUTH EVERY 12 (TWELVE) HOURS AS NEEDED (WITH FOOD). 60 tablet 2   Multiple Vitamins-Minerals (ONE-A-DAY VITACRAVES ADULT) CHEW Chew 1 each by mouth daily.     predniSONE (DELTASONE) 2.5 MG tablet Take 7 mg by mouth.     riTUXimab (RITUXAN) 500 MG/50ML injection Inject 1,000 mg into the vein every 6 (six) months.      valACYclovir (VALTREX) 500 MG tablet TAKE 1 TABLET BY MOUTH DAILY. FOR  3 DAYS AT ONSET OF SYMPTOMS RELATED WITH AN OUTBREAK 90 tablet 1   No facility-administered medications prior to visit.   Allergies  Allergen Reactions   Buspirone Nausea Only   Paxil  [Paroxetine Hcl] Nausea Only     ROS: A complete ROS was performed with pertinent positives/negatives noted in the HPI. The remainder of the ROS are negative.    Objective:   Today's Vitals   03/06/23 1522  BP: 122/80  Pulse: 73  Temp: 97.8 F (36.6 C)  TempSrc: Temporal  SpO2: 99%  Weight: 165 lb (74.8 kg)  Height: 5\' 9"  (1.753 m)        GENERAL: Well-appearing, in NAD. Well nourished.  SKIN: Pink, warm and dry. No rash, lesion, ulceration, or ecchymoses.  HEENT:    HEAD: Normocephalic, non-traumatic.  EYES: Conjunctive pink without exudate. PERRL, EOMI.  EARS: External ear w/o redness, swelling, masses, or lesions. EAC clear. TM's intact, cloudy with erythema bilaterally,  appropriate landmarks visualized.  RESPIRATORY: Chest wall symmetrical. Respirations even and non-labored.  CARDIAC: S1, S2 present, regular rate and rhythm. Peripheral pulses 2+ bilaterally.  PSYCH/MENTAL STATUS: Alert, oriented x 3. Cooperative, appropriate mood and affect.    No results found for any visits on 03/06/23.    Assessment & Plan:  Assessment and Plan    Otitis Media: Severe earache for the past two days, no fever, no chills, no drainage from the ear, but some nasal drainage. Examination revealed inflammation and cloudiness in the right and left ear. -Prescribe Augmentin, 1 tablet 2 times a day for 7 days. -Continue ibuprofen and Tylenol for pain management.     Meds ordered this encounter  Medications   amoxicillin-clavulanate (AUGMENTIN) 875-125 MG tablet    Sig: Take 1 tablet by mouth 2 (two) times daily for 7 days.    Dispense:  14 tablet    Refill:  0    Order Specific Question:   Supervising Provider    Answer:   Garnette Gunner [[id number]]   Lab Orders  No laboratory test(s) ordered today   No images are attached to the encounter or orders placed in the encounter.  Return if symptoms worsen or fail to improve.   Of note, portions of this note may have been created with voice recognition software Physicist, medical). While this note has been edited for accuracy, occasional wrong-word or 'sound-a-like' substitutions may have occurred due to the inherent limitations of voice recognition software.   Salvatore Decent, FNP

## 2023-03-21 DIAGNOSIS — M3320 Polymyositis, organ involvement unspecified: Secondary | ICD-10-CM | POA: Diagnosis not present

## 2023-04-12 ENCOUNTER — Encounter (INDEPENDENT_AMBULATORY_CARE_PROVIDER_SITE_OTHER): Payer: Self-pay

## 2023-05-09 ENCOUNTER — Other Ambulatory Visit: Payer: Self-pay | Admitting: Nurse Practitioner

## 2023-05-09 DIAGNOSIS — N946 Dysmenorrhea, unspecified: Secondary | ICD-10-CM

## 2023-05-11 ENCOUNTER — Telehealth: Payer: Self-pay

## 2023-05-11 ENCOUNTER — Encounter: Payer: Self-pay | Admitting: Nurse Practitioner

## 2023-05-11 ENCOUNTER — Ambulatory Visit (INDEPENDENT_AMBULATORY_CARE_PROVIDER_SITE_OTHER): Payer: 59 | Admitting: Nurse Practitioner

## 2023-05-11 VITALS — BP 120/70 | HR 66 | Temp 98.0°F | Ht 68.0 in | Wt 162.4 lb

## 2023-05-11 DIAGNOSIS — Z23 Encounter for immunization: Secondary | ICD-10-CM

## 2023-05-11 DIAGNOSIS — G950 Syringomyelia and syringobulbia: Secondary | ICD-10-CM | POA: Insufficient documentation

## 2023-05-11 DIAGNOSIS — M816 Localized osteoporosis [Lequesne]: Secondary | ICD-10-CM

## 2023-05-11 DIAGNOSIS — E7800 Pure hypercholesterolemia, unspecified: Secondary | ICD-10-CM | POA: Diagnosis not present

## 2023-05-11 DIAGNOSIS — Z0001 Encounter for general adult medical examination with abnormal findings: Secondary | ICD-10-CM | POA: Diagnosis not present

## 2023-05-11 DIAGNOSIS — F339 Major depressive disorder, recurrent, unspecified: Secondary | ICD-10-CM | POA: Diagnosis not present

## 2023-05-11 DIAGNOSIS — E559 Vitamin D deficiency, unspecified: Secondary | ICD-10-CM

## 2023-05-11 DIAGNOSIS — Z1211 Encounter for screening for malignant neoplasm of colon: Secondary | ICD-10-CM

## 2023-05-11 DIAGNOSIS — M3320 Polymyositis, organ involvement unspecified: Secondary | ICD-10-CM | POA: Diagnosis not present

## 2023-05-11 DIAGNOSIS — H6993 Unspecified Eustachian tube disorder, bilateral: Secondary | ICD-10-CM | POA: Diagnosis not present

## 2023-05-11 DIAGNOSIS — Z1231 Encounter for screening mammogram for malignant neoplasm of breast: Secondary | ICD-10-CM

## 2023-05-11 LAB — LIPID PANEL
Cholesterol: 227 mg/dL — ABNORMAL HIGH (ref 0–200)
HDL: 57.7 mg/dL (ref >39.00)
LDL Cholesterol: 156 mg/dL — ABNORMAL HIGH (ref 0–99)
NonHDL: 168.86
Total CHOL/HDL Ratio: 4
Triglycerides: 64 mg/dL (ref 0.0–149.0)
VLDL: 12.8 mg/dL (ref 0.0–40.0)

## 2023-05-11 LAB — BASIC METABOLIC PANEL
BUN: 10 mg/dL (ref 6–23)
CO2: 22 meq/L (ref 19–32)
Calcium: 9.3 mg/dL (ref 8.4–10.5)
Chloride: 110 meq/L (ref 96–112)
Creatinine, Ser: 0.58 mg/dL (ref 0.40–1.20)
GFR: 109.34 mL/min (ref >60.00)
Glucose, Bld: 76 mg/dL (ref 70–99)
Potassium: 3.8 meq/L (ref 3.5–5.1)
Sodium: 140 meq/L (ref 135–145)

## 2023-05-11 LAB — VITAMIN D 25 HYDROXY (VIT D DEFICIENCY, FRACTURES): VITD: 26.17 ng/mL — ABNORMAL LOW (ref 30.00–100.00)

## 2023-05-11 LAB — TSH: TSH: 0.94 u[IU]/mL (ref 0.35–5.50)

## 2023-05-11 MED ORDER — EVENITY 105 MG/1.17ML ~~LOC~~ SOSY
210.0000 mg | PREFILLED_SYRINGE | SUBCUTANEOUS | 11 refills | Status: DC
Start: 2023-05-11 — End: 2023-05-23

## 2023-05-11 MED ORDER — SERTRALINE HCL 50 MG PO TABS: 50.0000 mg | ORAL_TABLET | Freq: Every day | ORAL | 5 refills | Status: DC

## 2023-05-11 MED ORDER — FLUTICASONE PROPIONATE 50 MCG/ACT NA SUSP
2.0000 | Freq: Every day | NASAL | 5 refills | Status: DC
Start: 2023-05-11 — End: 2023-10-16

## 2023-05-11 MED ORDER — LORATADINE 10 MG PO TABS
10.0000 mg | ORAL_TABLET | Freq: Every evening | ORAL | 0 refills | Status: DC
Start: 2023-05-11 — End: 2023-10-16

## 2023-05-11 NOTE — Telephone Encounter (Signed)
-----   Message from [place] sent at 05/11/2023 10:15 AM EDT ----- Regarding: [name] Rx Start PA for [employer].  Thank you CN

## 2023-05-11 NOTE — Assessment & Plan Note (Signed)
Onset 2009 Managed by Dr. Erroll Luna with [facility]. Current use of oral prednisone, rituxan, and hydrocodone.(prescribed by Dr. Alphonzo Dublin per patient)

## 2023-05-11 NOTE — Assessment & Plan Note (Signed)
Repeat vit. D Maintain OVER THE COUNTER dose 1000IU daily

## 2023-05-11 NOTE — Assessment & Plan Note (Signed)
Acute on chronic due to recent stressors at home (daughter's health and separation from wife).she plans to schedule appointment with therapist Denies any SI/HI Current use of celexa with no improvement, reports increased anxiety with current dose.  Switch to zoloft 50mg  in PM Use calm OVER THE COUNTER supplement for sleep prn F/up in 3month

## 2023-05-11 NOTE — Telephone Encounter (Signed)
Evenity VOB initiated via [email or web address]

## 2023-05-11 NOTE — Assessment & Plan Note (Signed)
Repeat lipid panel ?

## 2023-05-11 NOTE — Assessment & Plan Note (Addendum)
Minimal improvement in T-score with fosamax, calcium, and vit. D x 53yrs Last dexa scam 2024: -2.4 in right femur. From -2.8 63yrs ago She is a high fall risk due to unsteady gait and left side weakness.  Switch fosamax to Evenity monthly x88months Pending Evenity PA Advised to continue calcium and vit. D OVER THE COUNTER dose. Repeat BMP

## 2023-05-11 NOTE — Progress Notes (Signed)
Complete physical exam  Patient: [name]   DOB: 1976/01/21   47 y.o. Female  MRN: [id number] Visit Date: 05/11/2023  Subjective:    Chief Complaint  Patient presents with   Annual Exam    Fasting CPE. Due for a pap today.   [name] is a 47 y.o. female who presents today for a complete physical exam. She reports consuming a general diet.  Limited due to muscle weakness  She generally feels poorly. She reports sleeping poorly. She does have additional problems to discuss today.  Vision:Yes Dental:Yes STD Screen:No  BP Readings from Last 3 Encounters:  05/11/23 120/70  03/06/23 122/80  05/08/22 126/84   Wt Readings from Last 3 Encounters:  05/11/23 162 lb 6.4 oz (73.7 kg)  03/06/23 165 lb (74.8 kg)  09/22/22 170 lb (77.1 kg)   Most recent fall risk assessment:    05/11/2023    9:09 AM  Fall Risk   Falls in the past year? 1  Number falls in past yr: 0  Injury with Fall? 0  Risk for fall due to : Impaired mobility  Follow up Falls evaluation completed    Depression screen:Yes - Depression Most recent depression screenings:    05/11/2023    9:09 AM 03/06/2023    3:22 PM  PHQ 2/9 Scores  PHQ - 2 Score 0 0  PHQ- 9 Score 3     HPI  Osteoporosis Minimal improvement in T-score with fosamax, calcium, and vit. D x 76yrs Last dexa scam 2024: -2.4 in right femur. From -2.8 18yrs ago She is a high fall risk due to unsteady gait and left side weakness.  Switch fosamax to Evenity monthly x70months Pending Evenity PA Advised to continue calcium and vit. D OVER THE COUNTER dose. Repeat BMP  Polymyositis (HCC) Onset 2009 Managed by Dr. Erroll Luna [facility]. Current use of oral prednisone, rituxan, and hydrocodone.(prescribed by Dr. Alphonzo Dublin per patient)  HYPERCHOLESTEROLEMIA Repeat lipid panel  Vitamin D deficiency Repeat vit. D Maintain OVER THE COUNTER dose 1000IU daily  Depression, recurrent (HCC) Acute on chronic due to  recent stressors at home (daughter's health and separation from wife).she plans to schedule appointment with therapist Denies any SI/HI Current use of celexa with no improvement, reports increased anxiety with current dose.  Switch to zoloft 50mg  in PM Use calm OVER THE COUNTER supplement for sleep prn F/up in 25month   Past Medical History:  Diagnosis Date   Anxiety    Hyperlipidemia    HYPERTENSION, BENIGN ESSENTIAL 06/16/2008   Qualifier: Diagnosis of  By: Daphine Deutscher FNP, [place]     Polymyositis [facility]) 2009   RLS (restless legs syndrome)    Vitamin D deficiency    Past Surgical History:  Procedure Laterality Date   BRAIN SURGERY     Social History   Socioeconomic History   Marital status: Unknown    Spouse name: Not on file   Number of children: Not on file   Years of education: Not on file   Highest education level: Not on file  Occupational History   Not on file  Tobacco Use   Smoking status: Former    Types: Cigarettes   Smokeless tobacco: Never  Vaping Use   Vaping status: Never Used  Substance and Sexual Activity   Alcohol use: Yes    Alcohol/week: 2.0 standard drinks of alcohol    Types: 1 Glasses of wine, 1 Shots of liquor per week    Comment:  Occasional   Drug use: Yes    Frequency: 2.0 times per week    Types: Marijuana    Comment: Episodic   Sexual activity: Yes    Birth control/protection: None  Other Topics Concern   Not on file  Social History Narrative   Not on file   Social Determinants of Health   Financial Resource Strain: Low Risk  (09/22/2022)   Overall Financial Resource Strain (CARDIA)    Difficulty of Paying Living Expenses: Not hard at all  Food Insecurity: No Food Insecurity (09/22/2022)   Hunger Vital Sign    Worried About Running Out of Food in the Last Year: Never true    Ran Out of Food in the Last Year: Never true  Transportation Needs: No Transportation Needs (09/22/2022)   PRAPARE - Administrator, Civil Service  (Medical): No    Lack of Transportation (Non-Medical): No  Physical Activity: Insufficiently Active (09/22/2022)   Exercise Vital Sign    Days of Exercise per Week: 3 days    Minutes of Exercise per Session: 30 min  Stress: No Stress Concern Present (09/22/2022)   [employer] of Occupational Health - Occupational Stress Questionnaire    Feeling of Stress : Not at all  Social Connections: Socially Isolated (09/20/2021)   Social Connection and Isolation Panel [NHANES]    Frequency of Communication with Friends and Family: Twice a week    Frequency of Social Gatherings with Friends and Family: Twice a week    Attends Religious Services: Never    Database administrator or Organizations: No    Attends Banker Meetings: Never    Marital Status: Never married  Intimate Partner Violence: Not At Risk (09/20/2021)   Humiliation, Afraid, Rape, and Kick questionnaire    Fear of Current or Ex-Partner: No    Emotionally Abused: No    Physically Abused: No    Sexually Abused: No   Family Status  Relation Name Status   Mother  Alive   Father  Alive   Sister  Alive   Brother  Alive   Nutritional therapist  (Not Specified)   MGM  Deceased   MGF  Deceased   PGM  Alive   PGF  Deceased   Mat Aunt  (Not Specified)  No partnership data on file   Family History  Problem Relation Age of Onset   Hepatitis Mother    Diabetes Paternal Uncle    Diabetes Maternal Grandmother    Heart disease Paternal Grandfather    Breast cancer Maternal Aunt        ? age of onset   Allergies  Allergen Reactions   Buspirone Nausea Only   Paxil  [Paroxetine Hcl] Nausea Only    Patient Care Team: Lidie Glade, Bonna Gains, NP as PCP - General (Internal Medicine)   Medications: Outpatient Medications Prior to Visit  Medication Sig   alendronate (FOSAMAX) 70 MG tablet Take 1 tablet (70 mg total) by mouth once a week. Take with a full glass of water on an empty stomach.   Cholecalciferol (VITAMIN D3) 25 MCG  (1000 UT) CAPS Take by mouth.   cycloSPORINE (RESTASIS) 0.05 % ophthalmic emulsion 1 drop 2 (two) times daily.   HYDROcodone-acetaminophen (NORCO/VICODIN) 5-325 MG tablet Take 1 tablet by mouth every 6 (six) hours as needed.   ibuprofen (ADVIL) 600 MG tablet TAKE 1 TABLET (600 MG TOTAL) BY MOUTH EVERY 12 (TWELVE) HOURS AS NEEDED (WITH FOOD).   Multiple Vitamins-Minerals (ONE-A-DAY  VITACRAVES ADULT) CHEW Chew 1 each by mouth daily.   predniSONE (DELTASONE) 2.5 MG tablet Take 7 mg by mouth.   riTUXimab (RITUXAN) 500 MG/50ML injection Inject 1,000 mg into the vein every 6 (six) months.    valACYclovir (VALTREX) 500 MG tablet TAKE 1 TABLET BY MOUTH DAILY. FOR 3 DAYS AT ONSET OF SYMPTOMS RELATED WITH AN OUTBREAK   [DISCONTINUED] citalopram (CELEXA) 20 MG tablet TAKE 1 TABLET BY MOUTH EVERY DAY (Patient not taking: Reported on 05/11/2023)   No facility-administered medications prior to visit.    Review of Systems  Constitutional:  Negative for activity change, appetite change and unexpected weight change.  Respiratory: Negative.    Cardiovascular: Negative.   Gastrointestinal: Negative.   Endocrine: Negative for cold intolerance and heat intolerance.  Genitourinary: Negative.   Musculoskeletal: Negative.   Skin: Negative.   Neurological: Negative.   Hematological: Negative.   Psychiatric/Behavioral:  Positive for agitation, dysphoric mood and sleep disturbance. Negative for behavioral problems, decreased concentration, hallucinations, self-injury and suicidal ideas. The patient is nervous/anxious.         Objective:  BP 120/70   Pulse 66   Temp 98 F (36.7 C) (Oral)   Ht 5\' 8"  (1.727 m)   Wt 162 lb 6.4 oz (73.7 kg)   SpO2 96%   BMI 24.69 kg/m     Physical Exam Vitals and nursing note reviewed.  Constitutional:      General: She is not in acute distress. HENT:     Right Ear: Tympanic membrane, ear canal and external ear normal.     Left Ear: Tympanic membrane, ear canal and  external ear normal.     Nose: Nose normal.  Eyes:     Extraocular Movements: Extraocular movements intact.     Conjunctiva/sclera: Conjunctivae normal.     Pupils: Pupils are equal, round, and reactive to light.  Neck:     Thyroid: No thyroid mass, thyromegaly or thyroid tenderness.  Cardiovascular:     Rate and Rhythm: Normal rate and regular rhythm.     Pulses: Normal pulses.     Heart sounds: Normal heart sounds.  Pulmonary:     Effort: Pulmonary effort is normal.     Breath sounds: Normal breath sounds.  Abdominal:     General: Bowel sounds are normal.     Palpations: Abdomen is soft.  Musculoskeletal:        General: Normal range of motion.     Cervical back: Normal range of motion and neck supple.     Right lower leg: No edema.     Left lower leg: No edema.  Lymphadenopathy:     Cervical: No cervical adenopathy.  Skin:    General: Skin is warm and dry.  Neurological:     Mental Status: She is alert and oriented to person, place, and time.     Cranial Nerves: No cranial nerve deficit.  Psychiatric:        Mood and Affect: Mood normal.        Behavior: Behavior normal.        Thought Content: Thought content normal.     No results found for any visits on 05/11/23.    Assessment & Plan:    Routine Health Maintenance and Physical Exam  Immunization History  Administered Date(s) Administered   Fluad Trivalent(High Dose 65+) 05/11/2023   Influenza Inj Mdck Quad Pf 05/03/2022   Influenza Split 08/29/2013   Influenza,inj,Quad PF,6+ Mos 11/04/2018, 08/26/2019   Influenza-Unspecified 08/11/2020, 04/28/2021,  05/03/2022   PFIZER Comirnaty(Gray Top)Covid-19 Tri-Sucrose Vaccine 11/20/2019, 12/11/2019, 04/28/2020   Tdap 07/20/2017   Health Maintenance  Topic Date Due   Colonoscopy  Never done   Medicare Annual Wellness (AWV)  09/23/2023   PAP SMEAR-Modifier  11/10/2024   DTaP/Tdap/Td (2 - Td or Tdap) 07/21/2027   INFLUENZA VACCINE  Completed   Hepatitis C Screening   Completed   HIV Screening  Completed   HPV VACCINES  Aged Out   COVID-19 Vaccine  Discontinued   Discussed health benefits of physical activity, and encouraged her to engage in regular exercise appropriate for her age and condition.  Problem List Items Addressed This Visit     Depression, recurrent (HCC)    Acute on chronic due to recent stressors at home (daughter's health and separation from wife).she plans to schedule appointment with therapist Denies any SI/HI Current use of celexa with no improvement, reports increased anxiety with current dose.  Switch to zoloft 50mg  in PM Use calm OVER THE COUNTER supplement for sleep prn F/up in 81month       Relevant Medications   sertraline (ZOLOFT) 50 MG tablet   Other Relevant Orders   TSH   HYPERCHOLESTEROLEMIA    Repeat lipid panel      Relevant Orders   Lipid panel   Osteoporosis    Minimal improvement in T-score with fosamax, calcium, and vit. D x 46yrs Last dexa scam 2024: -2.4 in right femur. From -2.8 75yrs ago She is a high fall risk due to unsteady gait and left side weakness.  Switch fosamax to Evenity monthly x381months Pending Evenity PA Advised to continue calcium and vit. D OVER THE COUNTER dose. Repeat BMP      Relevant Medications   Romosozumab-aqqg (EVENITY) 105 MG/1. SOSY injection   Other Relevant Orders   TSH   VITAMIN D 25 Hydroxy (Vit-D Deficiency, Fractures)   Polymyositis (HCC)    Onset 2009 Managed by Dr. Erroll Luna with [facility]. Current use of oral prednisone, rituxan, and hydrocodone.(prescribed by Dr. Alphonzo Dublin per patient)      Vitamin D deficiency    Repeat vit. D Maintain OVER THE COUNTER dose 1000IU daily      Relevant Orders   VITAMIN D 25 Hydroxy (Vit-D Deficiency, Fractures)   Other Visit Diagnoses     Encounter for preventative adult health care exam with abnormal findings    -  Primary   Relevant Orders   Basic metabolic panel   Breast cancer  screening by mammogram       Relevant Orders   MM 3D SCREENING MAMMOGRAM BILATERAL BREAST   Colon cancer screening       Relevant Orders   Cologuard   Immunization due       Relevant Orders   Flu Vaccine Trivalent High Dose (Fluad) (Completed)   Eustachian tube dysfunction, bilateral       Relevant Medications   fluticasone (FLONASE) 50 MCG/ACT nasal spray   loratadine (CLARITIN) 10 MG tablet      Return in about 4 weeks (around 06/08/2023) for depression and anxiety.     Alysia Penna, NP

## 2023-05-11 NOTE — Patient Instructions (Signed)
Stop celexa Start zoloft 50mg  daily Use calm supplement (magnesium and melatonin) OVER THE COUNTER for sleep. Schedule appointment with therapist You will be contacted to schedule appointment for mobile mammogram. Call office if you do not get cologuard kit within the next 2weeks Go to lab  Preventive Care 45-45 Years Old, Female Preventive care refers to lifestyle choices and visits with your health care provider that can promote health and wellness. Preventive care visits are also called wellness exams. What can I expect for my preventive care visit? Counseling Your health care provider may ask you questions about your: Medical history, including: Past medical problems. Family medical history. Pregnancy history. Current health, including: Menstrual cycle. Method of birth control. Emotional well-being. Home life and relationship well-being. Sexual activity and sexual health. Lifestyle, including: Alcohol, nicotine or tobacco, and drug use. Access to firearms. Diet, exercise, and sleep habits. Work and work Astronomer. Sunscreen use. Safety issues such as seatbelt and bike helmet use. Physical exam Your health care provider will check your: Height and weight. These may be used to calculate your BMI (body mass index). BMI is a measurement that tells if you are at a healthy weight. Waist circumference. This measures the distance around your waistline. This measurement also tells if you are at a healthy weight and may help predict your risk of certain diseases, such as type 2 diabetes and high blood pressure. Heart rate and blood pressure. Body temperature. Skin for abnormal spots. What immunizations do I need?  Vaccines are usually given at various ages, according to a schedule. Your health care provider will recommend vaccines for you based on your age, medical history, and lifestyle or other factors, such as travel or where you work. What tests do I need? Screening Your  health care provider may recommend screening tests for certain conditions. This may include: Lipid and cholesterol levels. Diabetes screening. This is done by checking your blood sugar (glucose) after you have not eaten for a while (fasting). Pelvic exam and Pap test. Hepatitis B test. Hepatitis C test. HIV (human immunodeficiency virus) test. STI (sexually transmitted infection) testing, if you are at risk. Lung cancer screening. Colorectal cancer screening. Mammogram. Talk with your health care provider about when you should start having regular mammograms. This may depend on whether you have a family history of breast cancer. BRCA-related cancer screening. This may be done if you have a family history of breast, ovarian, tubal, or peritoneal cancers. Bone density scan. This is done to screen for osteoporosis. Talk with your health care provider about your test results, treatment options, and if necessary, the need for more tests. Follow these instructions at home: Eating and drinking  Eat a diet that includes fresh fruits and vegetables, whole grains, lean protein, and low-fat dairy products. Take vitamin and mineral supplements as recommended by your health care provider. Do not drink alcohol if: Your health care provider tells you not to drink. You are pregnant, may be pregnant, or are planning to become pregnant. If you drink alcohol: Limit how much you have to 0-1 drink a day. Know how much alcohol is in your drink. In the [place] one drink equals one 12 oz bottle of beer (355 mL), one 5 oz glass of wine (148 mL), or one 1 oz glass of hard liquor (44 mL). Lifestyle Brush your teeth every morning and night with fluoride toothpaste. Floss one time each day. Exercise for at least 30 minutes 5 or more days each week. Do not use any products  that contain nicotine or tobacco. These products include cigarettes, chewing tobacco, and vaping devices, such as e-cigarettes. If you need help  quitting, ask your health care provider. Do not use drugs. If you are sexually active, practice safe sex. Use a condom or other form of protection to prevent STIs. If you do not wish to become pregnant, use a form of birth control. If you plan to become pregnant, see your health care provider for a prepregnancy visit. Take aspirin only as told by your health care provider. Make sure that you understand how much to take and what form to take. Work with your health care provider to find out whether it is safe and beneficial for you to take aspirin daily. Find healthy ways to manage stress, such as: Meditation, yoga, or listening to music. Journaling. Talking to a trusted person. Spending time with friends and family. Minimize exposure to UV radiation to reduce your risk of skin cancer. Safety Always wear your seat belt while driving or riding in a vehicle. Do not drive: If you have been drinking alcohol. Do not ride with someone who has been drinking. When you are tired or distracted. While texting. If you have been using any mind-altering substances or drugs. Wear a helmet and other protective equipment during sports activities. If you have firearms in your house, make sure you follow all gun safety procedures. Seek help if you have been physically or sexually abused. What's next? Visit your health care provider once a year for an annual wellness visit. Ask your health care provider how often you should have your eyes and teeth checked. Stay up to date on all vaccines. This information is not intended to replace advice given to you by your health care provider. Make sure you discuss any questions you have with your health care provider. Document Revised: 02/09/2021 Document Reviewed: 02/09/2021 Elsevier Patient Education  2024 [employer].

## 2023-05-17 DIAGNOSIS — Z1211 Encounter for screening for malignant neoplasm of colon: Secondary | ICD-10-CM | POA: Diagnosis not present

## 2023-05-17 NOTE — Telephone Encounter (Signed)
PA pending. Request#: [id number]

## 2023-05-22 NOTE — Telephone Encounter (Signed)
Pt ready for scheduling for EVENITY on or after : 05/22/23  Out-of-pocket cost due at time of visit: $0  Primary: UHC MEDICARE Prolia co-insurance: 0% Admin fee co-insurance: 0%  Secondary: NCMED Prolia co-insurance: Medicaid will consider the Part B deductible and coinsurance at 100% Admin fee co-insurance:   Medical Benefit Details: Date Benefits were checked: 05/16/23 Deductible: NO/ Coinsurance: 0%/ Admin Fee: 0%  Prior Auth: APPROVED PA# [id number]  Expiration Date: 05/17/23-05/16/24 # of doses approved: 12  Pharmacy benefit: 20% COPAY, 20% ADMIN FEE If patient wants fill through the pharmacy benefit please send prescription to: [facility], and include estimated need by date in rx notes. Pharmacy will ship medication directly to the office.  Patient NOT eligible for Evenity Copay Card. Copay Card can make patient's cost as little as $25. Link to apply: https://www.amgensupportplus.com/copay  ** This summary of benefits is an estimation of the patient's out-of-pocket cost. Exact cost may very based on individual plan coverage.

## 2023-05-22 NOTE — Telephone Encounter (Signed)
Authorization Number: [id number] 05/17/23-05/16/24

## 2023-05-23 MED ORDER — EVENITY 105 MG/1.17ML ~~LOC~~ SOSY: 210.0000 mg | PREFILLED_SYRINGE | SUBCUTANEOUS | 3 refills | Status: DC

## 2023-05-23 NOTE — Addendum Note (Signed)
Addended by: Michaela Corner on: 05/23/2023 02:35 PM   Modules accepted: Orders

## 2023-05-24 LAB — COLOGUARD: COLOGUARD: NEGATIVE

## 2023-06-03 ENCOUNTER — Other Ambulatory Visit: Payer: Self-pay | Admitting: Nurse Practitioner

## 2023-06-03 DIAGNOSIS — F339 Major depressive disorder, recurrent, unspecified: Secondary | ICD-10-CM

## 2023-06-08 MED ORDER — DENOSUMAB 60 MG/ML ~~LOC~~ SOSY: 60.0000 mg | PREFILLED_SYRINGE | SUBCUTANEOUS | 1 refills | Status: DC

## 2023-06-08 NOTE — Assessment & Plan Note (Signed)
Evenity not covered by insurance per [facility] Changed prescription to prolia injection

## 2023-06-08 NOTE — Addendum Note (Signed)
Addended by: Alysia Penna L on: 06/08/2023 03:27 PM   Modules accepted: Orders

## 2023-06-11 NOTE — Telephone Encounter (Signed)
I called and spoke with patient and notified her that Evenity not covered by insurance per [facility] and Claris Gower changed prescription to prolia injection and it was sent to [facility]. I told patient once she receives the Rx to call and schedule a nurse visit so injection can be given or that we can show her how to do it herself at home.

## 2023-06-28 ENCOUNTER — Telehealth: Payer: Self-pay | Admitting: Nurse Practitioner

## 2023-06-28 NOTE — Telephone Encounter (Signed)
Pharmacy called to say that pts prolia injections will be delivered on 11/1.  If question s call [phone]  [facility] - [place], [place] - [address] [place] [address] Phone: [phone]  Fax: [phone]

## 2023-06-29 NOTE — Telephone Encounter (Signed)
Prolia delivered to the office. Placed in fridge. Will notify FO to sched pt for NV to have prolia administered.

## 2023-06-29 NOTE — Telephone Encounter (Signed)
Done

## 2023-07-02 NOTE — Telephone Encounter (Signed)
Medication delivered 06/29/23. Pt schedule for NV.

## 2023-07-03 ENCOUNTER — Ambulatory Visit (INDEPENDENT_AMBULATORY_CARE_PROVIDER_SITE_OTHER): Payer: 59

## 2023-07-03 ENCOUNTER — Other Ambulatory Visit: Payer: Self-pay

## 2023-07-03 DIAGNOSIS — M816 Localized osteoporosis [Lequesne]: Secondary | ICD-10-CM | POA: Diagnosis not present

## 2023-07-03 MED ORDER — DENOSUMAB 60 MG/ML ~~LOC~~ SOSY
60.0000 mg | PREFILLED_SYRINGE | Freq: Once | SUBCUTANEOUS | Status: AC
Start: 2023-07-03 — End: 2023-07-03
  Administered 2023-07-03: 60 mg via SUBCUTANEOUS

## 2023-07-03 NOTE — Progress Notes (Signed)
Patient presents for a Prolia injection. Injection placed in left arm subcutaneously. Patient tolerated procedure well with no concerns.  I asked patient to schedule a nurse visit to come back in 6 months for next injection around 12/31/2023.  Prolia was supplied through [facility]  Prolia 60mg /ml Lot: [id number] Exp: [id number] Ndc: 28413-244-01

## 2023-08-23 ENCOUNTER — Other Ambulatory Visit (HOSPITAL_COMMUNITY): Payer: Self-pay

## 2023-08-28 ENCOUNTER — Ambulatory Visit
Admission: RE | Admit: 2023-08-28 | Discharge: 2023-08-28 | Disposition: A | Discharge status: Home / Self Care | Payer: 59 | Source: Ambulatory Visit | Attending: Nurse Practitioner | Admitting: Nurse Practitioner

## 2023-08-28 DIAGNOSIS — Z1231 Encounter for screening mammogram for malignant neoplasm of breast: Secondary | ICD-10-CM | POA: Diagnosis not present

## 2023-09-24 ENCOUNTER — Ambulatory Visit (INDEPENDENT_AMBULATORY_CARE_PROVIDER_SITE_OTHER): Payer: 59

## 2023-09-24 DIAGNOSIS — Z0000 Encounter for general adult medical examination without abnormal findings: Secondary | ICD-10-CM | POA: Diagnosis not present

## 2023-09-24 NOTE — Patient Instructions (Signed)
[name] , Thank you for taking time to come for your Medicare Wellness Visit. I appreciate your ongoing commitment to your health goals. Please review the following plan we discussed and let me know if I can assist you in the future.   Referrals/Orders/Follow-Ups/Clinician Recommendations: complaints of stomach pain and weight loss  Managing Pain Without Opioids Opioids are strong medicines used to treat moderate to severe pain. For some people, especially those who have long-term (chronic) pain, opioids may not be the best choice for pain management due to: Side effects like nausea, constipation, and sleepiness. The risk of addiction (opioid use disorder). The longer you take opioids, the greater your risk of addiction. Pain that lasts for more than 3 months is called chronic pain. Managing chronic pain usually requires more than one approach and is often provided by a team of health care providers working together (multidisciplinary approach). Pain management may be done at a pain management center or pain clinic. How to manage pain without the use of opioids Use non-opioid medicines Non-opioid medicines for pain may include: Over-the-counter or prescription non-steroidal anti-inflammatory drugs (NSAIDs). These may be the first medicines used for pain. They work well for muscle and bone pain, and they reduce swelling. Acetaminophen. This over-the-counter medicine may work well for milder pain but not swelling. Antidepressants. These may be used to treat chronic pain. A certain type of antidepressant (tricyclics) is often used. These medicines are given in lower doses for pain than when used for depression. Anticonvulsants. These are usually used to treat seizures but may also reduce nerve (neuropathic) pain. Muscle relaxants. These relieve pain caused by sudden muscle tightening (spasms). You may also use a pain medicine that is applied to the skin as a patch, cream, or gel (topical  analgesic), such as a numbing medicine. These may cause fewer side effects than medicines taken by mouth. Do certain therapies as directed Some therapies can help with pain management. They include: Physical therapy. You will do exercises to gain strength and flexibility. A physical therapist may teach you exercises to move and stretch parts of your body that are weak, stiff, or painful. You can learn these exercises at physical therapy visits and practice them at home. Physical therapy may also involve: Massage. Heat wraps or applying heat or cold to affected areas. Electrical signals that interrupt pain signals (transcutaneous electrical nerve stimulation, TENS). Weak lasers that reduce pain and swelling (low-level laser therapy). Signals from your body that help you learn to regulate pain (biofeedback). Occupational therapy. This helps you to learn ways to function at home and work with less pain. Recreational therapy. This involves trying new activities or hobbies, such as a physical activity or drawing. Mental health therapy, including: Cognitive behavioral therapy (CBT). This helps you learn coping skills for dealing with pain. Acceptance and commitment therapy (ACT) to change the way you think and react to pain. Relaxation therapies, including muscle relaxation exercises and mindfulness-based stress reduction. Pain management counseling. This may be individual, family, or group counseling.  Receive medical treatments Medical treatments for pain management include: Nerve block injections. These may include a pain blocker and anti-inflammatory medicines. You may have injections: Near the spine to relieve chronic back or neck pain. Into joints to relieve back or joint pain. Into nerve areas that supply a painful area to relieve body pain. Into muscles (trigger point injections) to relieve some painful muscle conditions. A medical device placed near your spine to help block pain signals  and relieve nerve  pain or chronic back pain (spinal cord stimulation device). Acupuncture. Follow these instructions at home Medicines Take over-the-counter and prescription medicines only as told by your health care provider. If you are taking pain medicine, ask your health care providers about possible side effects to watch out for. Do not drive or use heavy machinery while taking prescription opioid pain medicine. Lifestyle  Do not use drugs or alcohol to reduce pain. If you drink alcohol, limit how much you have to: 0-1 drink a day for women who are not pregnant. 0-2 drinks a day for men. Know how much alcohol is in a drink. In the [place] one drink equals one 12 oz bottle of beer (355 mL), one 5 oz glass of wine (148 mL), or one 1 oz glass of hard liquor (44 mL). Do not use any products that contain nicotine or tobacco. These products include cigarettes, chewing tobacco, and vaping devices, such as e-cigarettes. If you need help quitting, ask your health care provider. Eat a healthy diet and maintain a healthy weight. Poor diet and excess weight may make pain worse. Eat foods that are high in fiber. These include fresh fruits and vegetables, whole grains, and beans. Limit foods that are high in fat and processed sugars, such as fried and sweet foods. Exercise regularly. Exercise lowers stress and may help relieve pain. Ask your health care provider what activities and exercises are safe for you. If your health care provider approves, join an exercise class that combines movement and stress reduction. Examples include yoga and tai chi. Get enough sleep. Lack of sleep may make pain worse. Lower stress as much as possible. Practice stress reduction techniques as told by your therapist. General instructions Work with all your pain management providers to find the treatments that work best for you. You are an important member of your pain management team. There are many things you can do to  reduce pain on your own. Consider joining an online or in-person support group for people who have chronic pain. Keep all follow-up visits. This is important. Where to find more information You can find more information about managing pain without opioids from: American Academy of Pain Medicine: painmed.org Institute for Chronic Pain: instituteforchronicpain.org American Chronic Pain Association: theacpa.org Contact a health care provider if: You have side effects from pain medicine. Your pain gets worse or does not get better with treatments or home therapy. You are struggling with anxiety or depression. Summary Many types of pain can be managed without opioids. Chronic pain may respond better to pain management without opioids. Pain is best managed when you and a team of health care providers work together. Pain management without opioids may include non-opioid medicines, medical treatments, physical therapy, mental health therapy, and lifestyle changes. Tell your health care providers if your pain gets worse or is not being managed well enough. This information is not intended to replace advice given to you by your health care provider. Make sure you discuss any questions you have with your health care provider. Document Revised: 11/24/2020 Document Reviewed: 11/24/2020 Elsevier Patient Education  2024 Elsevier Inc.  This is a list of the screening recommended for you and due dates:  Health Maintenance  Topic Date Due   Colon Cancer Screening  Never done   Medicare Annual Wellness Visit  09/23/2024   Pap with HPV screening  11/10/2024   DTaP/Tdap/Td vaccine (2 - Td or Tdap) 07/21/2027   Flu Shot  Completed   Hepatitis C Screening  Completed  HIV Screening  Completed   HPV Vaccine  Aged Out   COVID-19 Vaccine  Discontinued    Advanced directives: (ACP Link)Information on Advanced Care Planning can be found at [facility] Advance Health Care Directives  Advance Health Care Directives ([email or web address]   Next Medicare Annual Wellness Visit scheduled for next year: Yes  insert Preventive Care attachment Insert FALL PREVENTION attachment if needed

## 2023-09-24 NOTE — Progress Notes (Signed)
Subjective:   [name] is a 48 y.o. female who presents for Medicare Annual (Subsequent) preventive examination.  Visit Complete: Virtual I connected with  Circe R Bourque on 09/24/23 by a audio enabled telemedicine application and verified that I am speaking with the correct person using two identifiers.  Interactive audio and video telecommunications were attempted between this provider and patient, however failed, due to patient having technical difficulties OR patient did not have access to video capability.  We continued and completed visit with audio only.  Patient Location: Home  Provider Location: Office/Clinic  I discussed the limitations of evaluation and management by telemedicine. The patient expressed understanding and agreed to proceed.  Vital Signs: Because this visit was a virtual/telehealth visit, some criteria may be missing or patient reported. Any vitals not documented were not able to be obtained and vitals that have been documented are patient reported.  Patient Medicare AWV questionnaire was completed by the patient on 09/24/2023; I have confirmed that all information answered by patient is correct and no changes since this date.  Cardiac Risk Factors include: none     Objective:    Today's Vitals   There is no height or weight on file to calculate BMI.     09/24/2023    2:23 PM 09/22/2022    2:45 PM 09/20/2021    3:50 PM 10/31/2019   10:29 AM 04/23/2019    2:06 PM 04/23/2017    5:00 AM 04/22/2017    4:31 PM  Advanced Directives  Does Patient Have a Medical Advance Directive? No No No Yes No  No  Does patient want to make changes to medical advance directive?    Yes (ED - Information included in AVS)     Would patient like information on creating a medical advance directive?   No - Patient declined  No - Patient declined       Information is confidential and restricted. Go to Review Flowsheets to unlock data.    Current Medications  (verified) Outpatient Encounter Medications as of 09/24/2023  Medication Sig   Cholecalciferol (VITAMIN D3) 25 MCG (1000 UT) CAPS Take by mouth.   cycloSPORINE (RESTASIS) 0.05 % ophthalmic emulsion 1 drop 2 (two) times daily.   denosumab (PROLIA) 60 MG/ML SOSY injection Inject 60 mg into the skin every 6 (six) months.   fluticasone (FLONASE) 50 MCG/ACT nasal spray Place 2 sprays into both nostrils daily.   HYDROcodone-acetaminophen (NORCO/VICODIN) 5-325 MG tablet Take 1 tablet by mouth every 6 (six) hours as needed.   ibuprofen (ADVIL) 600 MG tablet TAKE 1 TABLET (600 MG TOTAL) BY MOUTH EVERY 12 (TWELVE) HOURS AS NEEDED (WITH FOOD).   Multiple Vitamins-Minerals (ONE-A-DAY VITACRAVES ADULT) CHEW Chew 1 each by mouth daily.   predniSONE (DELTASONE) 2.5 MG tablet Take 7 mg by mouth.   riTUXimab (RITUXAN) 500 MG/50ML injection Inject 1,000 mg into the vein every 6 (six) months.    valACYclovir (VALTREX) 500 MG tablet TAKE 1 TABLET BY MOUTH DAILY. FOR 3 DAYS AT ONSET OF SYMPTOMS RELATED WITH AN OUTBREAK   loratadine (CLARITIN) 10 MG tablet Take 1 tablet (10 mg total) by mouth every evening. (Patient not taking: Reported on 09/24/2023)   sertraline (ZOLOFT) 50 MG tablet TAKE 1 TABLET BY MOUTH EVERY DAY (Patient not taking: Reported on 09/24/2023)   No facility-administered encounter medications on file as of 09/24/2023.    Allergies (verified) Buspirone and Paxil  [paroxetine hcl]   History: Past Medical History:  Diagnosis Date  Anxiety    Hyperlipidemia    HYPERTENSION, BENIGN ESSENTIAL 06/16/2008   Qualifier: Diagnosis of  By: Daphine Deutscher FNP, [place]     Polymyositis [facility]) 2009   RLS (restless legs syndrome)    Vitamin D deficiency    Past Surgical History:  Procedure Laterality Date   BRAIN SURGERY     Family History  Problem Relation Age of Onset   Hepatitis Mother    Diabetes Paternal Uncle    Diabetes Maternal Grandmother    Heart disease Paternal Grandfather    Breast cancer  Maternal Aunt        ? age of onset   Social History   Socioeconomic History   Marital status: Single    Spouse name: Not on file   Number of children: Not on file   Years of education: Not on file   Highest education level: Not on file  Occupational History   Not on file  Tobacco Use   Smoking status: Former    Types: Cigarettes   Smokeless tobacco: Never  Vaping Use   Vaping status: Never Used  Substance and Sexual Activity   Alcohol use: Not Currently    Alcohol/week: 2.0 standard drinks of alcohol    Types: 1 Glasses of wine, 1 Shots of liquor per week    Comment: Occasional   Drug use: Yes    Frequency: 2.0 times per week    Types: Marijuana    Comment: Episodic   Sexual activity: Yes    Birth control/protection: None  Other Topics Concern   Not on file  Social History Narrative   Not on file   Social Drivers of Health   Financial Resource Strain: Low Risk  (09/24/2023)   Overall Financial Resource Strain (CARDIA)    Difficulty of Paying Living Expenses: Not hard at all  Food Insecurity: No Food Insecurity (09/24/2023)   Hunger Vital Sign    Worried About Running Out of Food in the Last Year: Never true    Ran Out of Food in the Last Year: Never true  Transportation Needs: No Transportation Needs (09/24/2023)   PRAPARE - Administrator, Civil Service (Medical): No    Lack of Transportation (Non-Medical): No  Physical Activity: Insufficiently Active (09/24/2023)   Exercise Vital Sign    Days of Exercise per Week: 2 days    Minutes of Exercise per Session: 40 min  Stress: No Stress Concern Present (09/24/2023)   [employer] of Occupational Health - Occupational Stress Questionnaire    Feeling of Stress : Only a little  Social Connections: Moderately Isolated (09/24/2023)   Social Connection and Isolation Panel [NHANES]    Frequency of Communication with Friends and Family: More than three times a week    Frequency of Social Gatherings with  Friends and Family: Once a week    Attends Religious Services: More than 4 times per year    Active Member of [employer] or Organizations: No    Attends Engineer, structural: Never    Marital Status: Separated    Tobacco Counseling Counseling given: Not Answered   Clinical Intake:  Pre-visit preparation completed: Yes  Pain : No/denies pain     Nutritional Risks: None Diabetes: No  How often do you need to have someone help you when you read instructions, pamphlets, or other written materials from your doctor or pharmacy?: 1 - Never  Interpreter Needed?: No  Information entered by :: NAllen LPN   Activities of Daily Living  09/24/2023    1:19 PM  In your present state of health, do you have any difficulty performing the following activities:  Hearing? 0  Vision? 0  Difficulty concentrating or making decisions? 0  Walking or climbing stairs? 1  Dressing or bathing? 0  Doing errands, shopping? 0  Preparing Food and eating ? N  Using the Toilet? N  In the past six months, have you accidently leaked urine? N  Do you have problems with loss of bowel control? N  Managing your Medications? N  Managing your Finances? N  Housekeeping or managing your Housekeeping? N    Patient Care Team: Nche, Bonna Gains, NP as PCP - General (Internal Medicine)  Indicate any recent Medical Services you may have received from other than [facility] providers in the past year (date may be approximate).     Assessment:   This is a routine wellness examination for [name].  Hearing/Vision screen Hearing Screening - Comments:: Denies hearing issues Vision Screening - Comments:: Regular eye exams, [facility]   Goals Addressed             This Visit's Progress    Patient Stated       09/24/2023, get anxiety under control and find out about stomach pains       Depression Screen    09/24/2023    2:24 PM 05/11/2023    9:09 AM 03/06/2023    3:22 PM 09/22/2022    2:47 PM  05/08/2022   10:20 AM 11/03/2021    9:51 AM 09/20/2021    3:53 PM  PHQ 2/9 Scores  PHQ - 2 Score 1 0 0 0 0 0 0  PHQ- 9 Score  3   0 0     Fall Risk    09/24/2023    1:19 PM 05/11/2023    9:09 AM 03/06/2023    3:22 PM 09/22/2022    2:46 PM 09/21/2022    3:57 PM  Fall Risk   Falls in the past year? 0 1 0 1 1  Comment    leg gave out   Number falls in past yr: 0 0 0 0 0  Injury with Fall? 0 0 0 0 1  Risk for fall due to : Impaired balance/gait;Impaired mobility;Medication side effect Impaired mobility No Fall Risks Impaired balance/gait;Impaired mobility;Medication side effect   Follow up Falls prevention discussed;Falls evaluation completed Falls evaluation completed Falls prevention discussed Falls prevention discussed;Education provided;Falls evaluation completed     MEDICARE RISK AT HOME: Medicare Risk at Home Any stairs in or around the home?: (Patient-Rptd) No If so, are there any without handrails?: (Patient-Rptd) No Home free of loose throw rugs in walkways, pet beds, electrical cords, etc?: (Patient-Rptd) Yes Adequate lighting in your home to reduce risk of falls?: (Patient-Rptd) Yes Life alert?: (Patient-Rptd) No Use of a cane, walker or w/c?: (Patient-Rptd) Yes Grab bars in the bathroom?: (Patient-Rptd) No Shower chair or bench in shower?: (Patient-Rptd) Yes Elevated toilet seat or a handicapped toilet?: (Patient-Rptd) No  TIMED UP AND GO:  Was the test performed?  No    Cognitive Function:        09/24/2023    2:25 PM 09/22/2022    2:48 PM 10/31/2019   10:25 AM 04/23/2019    2:09 PM 04/23/2019    2:07 PM  6CIT Screen  What Year? 0 points 0 points 0 points 0 points 0 points  What month? 0 points 0 points 0 points 0 points 0 points  What time? 0 points 0 points 0 points 0 points 0 points  Count back from 20 0 points 0 points 0 points 0 points 0 points  Months in reverse 2 points 0 points 0 points 0 points 0 points  Repeat phrase 0 points 0 points 0 points 0 points    Total Score 2 points 0 points 0 points 0 points     Immunizations Immunization History  Administered Date(s) Administered   Fluad Trivalent(High Dose 65+) 05/11/2023   Influenza Inj Mdck Quad Pf 05/03/2022   Influenza Split 08/29/2013   Influenza,inj,Quad PF,6+ Mos 11/04/2018, 08/26/2019   Influenza-Unspecified 08/11/2020, 04/28/2021, 05/03/2022   PFIZER Comirnaty(Gray Top)Covid-19 Tri-Sucrose Vaccine 11/20/2019, 12/11/2019, 04/28/2020   Tdap 07/20/2017    TDAP status: Up to date  Flu Vaccine status: Up to date  Pneumococcal vaccine status: Up to date  Covid-19 vaccine status: Information provided on how to obtain vaccines.   Qualifies for Shingles Vaccine? No   Zostavax completed  n/a   Shingrix Completed?: n/a  Screening Tests Health Maintenance  Topic Date Due   Colonoscopy  Never done   Medicare Annual Wellness (AWV)  09/23/2024   Cervical Cancer Screening (HPV/Pap Cotest)  11/10/2024   DTaP/Tdap/Td (2 - Td or Tdap) 07/21/2027   INFLUENZA VACCINE  Completed   Hepatitis C Screening  Completed   HIV Screening  Completed   HPV VACCINES  Aged Out   COVID-19 Vaccine  Discontinued    Health Maintenance  Health Maintenance Due  Topic Date Due   Colonoscopy  Never done    Colorectal cancer screening: Type of screening: Cologuard. Completed 05/17/2023. Repeat every 3 years  Mammogram status: Completed 08/28/2023. Repeat every year  Bone Density status: Completed 10/31/2022.   Lung Cancer Screening: (Low Dose CT Chest recommended if Age 21-80 years, 20 pack-year currently smoking OR have quit w/in 15years.) does not qualify.   Lung Cancer Screening Referral: no  Additional Screening:  Hepatitis C Screening: does qualify; Completed 10/26/2020  Vision Screening: Recommended annual ophthalmology exams for early detection of glaucoma and other disorders of the eye. Is the patient up to date with their annual eye exam?  Yes  Who is the provider or what is the name of  the office in which the patient attends annual eye exams? [facility] If pt is not established with a provider, would they like to be referred to a provider to establish care? No .   Dental Screening: Recommended annual dental exams for proper oral hygiene  Diabetic Foot Exam: n/a  Community Resource Referral / Chronic Care Management: CRR required this visit?  No   CCM required this visit?  No     Plan:     I have personally reviewed and noted the following in the patient's chart:   Medical and social history Use of alcohol, tobacco or illicit drugs  Current medications and supplements including opioid prescriptions. Patient is currently taking opioid prescriptions. Information provided to patient regarding non-opioid alternatives. Patient advised to discuss non-opioid treatment plan with their provider. Functional ability and status Nutritional status Physical activity Advanced directives List of other physicians Hospitalizations, surgeries, and ER visits in previous 12 months Vitals Screenings to include cognitive, depression, and falls Referrals and appointments  In addition, I have reviewed and discussed with patient certain preventive protocols, quality metrics, and best practice recommendations. A written personalized care plan for preventive services as well as general preventive health recommendations were provided to patient.     Reino Kent  Freida Busman, LPN   1/61/0960   After Visit Summary: (MyChart) Due to this being a telephonic visit, the after visit summary with patients personalized plan was offered to patient via MyChart   Nurse Notes: none

## 2023-10-12 ENCOUNTER — Telehealth: Payer: Self-pay | Admitting: Nurse Practitioner

## 2023-10-12 NOTE — Telephone Encounter (Signed)
Copied from CRM [id number]. Topic: Referral - Request for Referral >> Oct 12, 2023  4:26 PM Fuller Mandril wrote: Did the patient discuss referral with their provider in the last year? Yes (If No - schedule appointment) (If Yes - send message)  Appointment offered? No  Type of order/referral and detailed reason for visit: Psychiatry   Preference of office, provider, location: N/A  If referral order, have you been seen by this specialty before? Yes (If Yes, this issue or another issue? When? Where? Behavioral Health a few years ago   Can we respond through MyChart? Yes

## 2023-10-15 NOTE — Telephone Encounter (Signed)
I called and spoke to patient and asked her if I could get her scheduled for an office visit to see Claris Gower to discuss referral. Appointment made for 10/16/23 at 140pm.

## 2023-10-16 ENCOUNTER — Ambulatory Visit (INDEPENDENT_AMBULATORY_CARE_PROVIDER_SITE_OTHER): Payer: 59 | Admitting: Nurse Practitioner

## 2023-10-16 ENCOUNTER — Encounter: Payer: Self-pay | Admitting: Nurse Practitioner

## 2023-10-16 VITALS — BP 140/80 | HR 84 | Temp 98.0°F | Ht 68.5 in | Wt 151.0 lb

## 2023-10-16 DIAGNOSIS — R030 Elevated blood-pressure reading, without diagnosis of hypertension: Secondary | ICD-10-CM | POA: Diagnosis not present

## 2023-10-16 DIAGNOSIS — I10 Essential (primary) hypertension: Secondary | ICD-10-CM | POA: Insufficient documentation

## 2023-10-16 DIAGNOSIS — L608 Other nail disorders: Secondary | ICD-10-CM | POA: Insufficient documentation

## 2023-10-16 DIAGNOSIS — F331 Major depressive disorder, recurrent, moderate: Secondary | ICD-10-CM | POA: Diagnosis not present

## 2023-10-16 DIAGNOSIS — R634 Abnormal weight loss: Secondary | ICD-10-CM | POA: Diagnosis not present

## 2023-10-16 DIAGNOSIS — M3320 Polymyositis, organ involvement unspecified: Secondary | ICD-10-CM

## 2023-10-16 LAB — CBC
HCT: 46.3 % — ABNORMAL HIGH (ref 36.0–46.0)
Hemoglobin: 15.3 g/dL — ABNORMAL HIGH (ref 12.0–15.0)
MCHC: 33 g/dL (ref 30.0–36.0)
MCV: 96.4 fL (ref 78.0–100.0)
Platelets: 343 10*3/uL (ref 150.0–400.0)
RBC: 4.8 Mil/uL (ref 3.87–5.11)
RDW: 14.9 % (ref 11.5–15.5)
WBC: 7.7 10*3/uL (ref 4.0–10.5)

## 2023-10-16 LAB — COMPREHENSIVE METABOLIC PANEL
ALT: 13 U/L (ref 0–35)
AST: 18 U/L (ref 0–37)
Albumin: 4.5 g/dL (ref 3.5–5.2)
Alkaline Phosphatase: 64 U/L (ref 39–117)
BUN: 11 mg/dL (ref 6–23)
CO2: 27 meq/L (ref 19–32)
Calcium: 9.4 mg/dL (ref 8.4–10.5)
Chloride: 108 meq/L (ref 96–112)
Creatinine, Ser: 0.59 mg/dL (ref 0.40–1.20)
GFR: 108.56 mL/min (ref >60.00)
Glucose, Bld: 67 mg/dL — ABNORMAL LOW (ref 70–99)
Potassium: 4.1 meq/L (ref 3.5–5.1)
Sodium: 143 meq/L (ref 135–145)
Total Bilirubin: 0.4 mg/dL (ref 0.2–1.2)
Total Protein: 7.8 g/dL (ref 6.0–8.3)

## 2023-10-16 LAB — TSH: TSH: 0.76 u[IU]/mL (ref 0.35–5.50)

## 2023-10-16 MED ORDER — MIRTAZAPINE 7.5 MG PO TABS
7.5000 mg | ORAL_TABLET | Freq: Every day | ORAL | 5 refills | Status: DC
Start: 2023-10-16 — End: 2023-11-12

## 2023-10-16 NOTE — Assessment & Plan Note (Signed)
Reports nausea and ABDOMEN with zoloft. She stopped med 4months ago. She also stopped ALCOHOL use 12/2022 and marijuana use 2months ago, no tobacco use, no illicit drug use Reports poor sleep, poor appetite, lack of motivation, and fatigue for last 4months. No SI/HI/hallucination 11lbs weight loss noted in last 4months due to poor appetite, denies any ABDOMEN pain or nausea or change in BM since discontinuation of zoloft. Wt Readings from Last 3 Encounters:  10/16/23 151 lb (68.5 kg)  05/11/23 162 lb 6.4 oz (73.7 kg)  03/06/23 165 lb (74.8 kg)    Check THYROID, CBC, CMP Start remeron 7.5mg  at hs Entered referral to psychology F/up in 54month

## 2023-10-16 NOTE — Assessment & Plan Note (Signed)
Advised to maintain DASH diet and monitor BP at home BP Readings from Last 3 Encounters:  10/16/23 (!) 140/80  05/11/23 120/70  03/06/23 122/80    F/up in 34month

## 2023-10-16 NOTE — Assessment & Plan Note (Signed)
Onset 1year ago, worsening, bilateral finger nails Normal radial pulse.  Entered referral to dermatology

## 2023-10-16 NOTE — Assessment & Plan Note (Signed)
Onset 2009 Managed by Dr. Erroll Luna with [facility]. Current use of oral prednisone, rituxan, and hydrocodone.(prescribed by Dr. Alphonzo Dublin per patient)

## 2023-10-16 NOTE — Progress Notes (Signed)
Established Patient Visit  Patient: [name]   DOB: 1975/11/10   48 y.o. Female  MRN: [id number] Visit Date: 10/16/2023  Subjective:    Chief Complaint  Patient presents with   Medication Management    Request a referral to psych, discuss coldness and discoloration in extremities, skin breakouts on hands   HPI Depression, recurrent (HCC) Reports nausea and ABDOMEN with zoloft. She stopped med 4months ago. She also stopped ALCOHOL use 12/2022 and marijuana use 2months ago, no tobacco use, no illicit drug use Reports poor sleep, poor appetite, lack of motivation, and fatigue for last 4months. No SI/HI/hallucination 11lbs weight loss noted in last 4months due to poor appetite, denies any ABDOMEN pain or nausea or change in BM since discontinuation of zoloft. Wt Readings from Last 3 Encounters:  10/16/23 151 lb (68.5 kg)  05/11/23 162 lb 6.4 oz (73.7 kg)  03/06/23 165 lb (74.8 kg)    Check THYROID, CBC, CMP Start remeron 7.5mg  at hs Entered referral to psychology F/up in 170month   Polymyositis [facility]) Onset 2009 Managed by Dr. Erroll Luna with [facility]. Current use of oral prednisone, rituxan, and hydrocodone.(prescribed by Dr. Alphonzo Dublin per patient)  Longitudinal melanonychia Onset 1year ago, worsening, bilateral finger nails Normal radial pulse.  Entered referral to dermatology  Elevated BP without diagnosis of hypertension Advised to maintain DASH diet and monitor BP at home BP Readings from Last 3 Encounters:  10/16/23 (!) 140/80  05/11/23 120/70  03/06/23 122/80    F/up in 170month     10/16/2023    2:31 PM 09/24/2023    2:24 PM 05/11/2023    9:09 AM  Depression screen PHQ 2/9  Decreased Interest 1 0 0  Down, Depressed, Hopeless 1 1 0  PHQ - 2 Score 2 1 0  Altered sleeping 1  0  Tired, decreased energy 1  1  Change in appetite 1  1  Feeling bad or failure about yourself  0  1  Trouble concentrating 1  0  Moving slowly or  fidgety/restless 1  0  Suicidal thoughts 0  0  PHQ-9 Score 7  3  Difficult doing work/chores   Somewhat difficult       10/16/2023    2:32 PM 05/11/2023    9:10 AM 05/08/2022   10:20 AM 11/03/2021    9:52 AM  GAD 7 : Generalized Anxiety Score  Nervous, Anxious, on Edge 1 1 0 0  Control/stop worrying 1 1 0 0  Worry too much - different things 1 1 0 0  Trouble relaxing 1 1 0 0  Restless 1 1 0 0  Easily annoyed or irritable 1 1 0 0  Afraid - awful might happen 1 1 0 0  Total GAD 7 Score 7 7 0 0  Anxiety Difficulty  Somewhat difficult Not difficult at all      Reviewed medical, surgical, and social history today  Medications: Outpatient Medications Prior to Visit  Medication Sig   Cholecalciferol (VITAMIN D3) 25 MCG (1000 UT) CAPS Take by mouth.   cromolyn (OPTICROM) 4 % ophthalmic solution Place 1 drop into both eyes 4 (four) times daily.   cycloSPORINE (RESTASIS) 0.05 % ophthalmic emulsion 1 drop 2 (two) times daily.   denosumab (PROLIA) 60 MG/ML SOSY injection Inject 60 mg into the skin every 6 (six) months.   HYDROcodone-acetaminophen (NORCO/VICODIN) 5-325 MG tablet Take 1 tablet by mouth  every 6 (six) hours as needed.   ibuprofen (ADVIL) 600 MG tablet TAKE 1 TABLET (600 MG TOTAL) BY MOUTH EVERY 12 (TWELVE) HOURS AS NEEDED (WITH FOOD).   Multiple Vitamins-Minerals (ONE-A-DAY VITACRAVES ADULT) CHEW Chew 1 each by mouth daily.   predniSONE (DELTASONE) 2.5 MG tablet Take 2.5 mg by mouth daily.   riTUXimab (RITUXAN) 500 MG/50ML injection Inject 1,000 mg into the vein every 6 (six) months.    [DISCONTINUED] fluticasone (FLONASE) 50 MCG/ACT nasal spray Place 2 sprays into both nostrils daily. (Patient not taking: Reported on 10/16/2023)   [DISCONTINUED] loratadine (CLARITIN) 10 MG tablet Take 1 tablet (10 mg total) by mouth every evening. (Patient not taking: Reported on 10/16/2023)   [DISCONTINUED] sertraline (ZOLOFT) 50 MG tablet TAKE 1 TABLET BY MOUTH EVERY DAY (Patient not taking:  Reported on 10/16/2023)   [DISCONTINUED] valACYclovir (VALTREX) 500 MG tablet TAKE 1 TABLET BY MOUTH DAILY. FOR 3 DAYS AT ONSET OF SYMPTOMS RELATED WITH AN OUTBREAK (Patient not taking: Reported on 10/16/2023)   No facility-administered medications prior to visit.   Reviewed past medical and social history.   ROS per HPI above      Objective:  BP (!) 140/80 (BP Location: Right Arm, Patient Position: Sitting, Cuff Size: Normal)   Pulse 84   Temp 98 F (36.7 C)   Ht 5' 8.5" (1.74 m)   Wt 151 lb (68.5 kg)   LMP 10/10/2023 (Exact Date)   SpO2 98%   BMI 22.63 kg/m      Physical Exam Vitals and nursing note reviewed.  Cardiovascular:     Rate and Rhythm: Normal rate.     Pulses: Normal pulses.          Radial pulses are 2+ on the right side and 2+ on the left side.       Dorsalis pedis pulses are 2+ on the right side and 2+ on the left side.       Posterior tibial pulses are 2+ on the right side and 2+ on the left side.  Pulmonary:     Effort: Pulmonary effort is normal.  Musculoskeletal:     Cervical back: Normal range of motion and neck supple.  Feet:     Right foot:     Skin integrity: Skin integrity normal.     Toenail Condition: Right toenails are normal.     Left foot:     Skin integrity: Skin integrity normal.     Toenail Condition: Left toenails are normal.  Neurological:     Mental Status: She is alert and oriented to person, place, and time.  Psychiatric:        Attention and Perception: Attention normal.        Mood and Affect: Mood is depressed. Affect is tearful.        Speech: Speech normal.        Behavior: Behavior is cooperative.        Thought Content: Thought content normal.        Cognition and Memory: Cognition and memory normal.        Judgment: Judgment normal.     No results found for any visits on 10/16/23.    Assessment & Plan:    Problem List Items Addressed This Visit     Depression, recurrent (HCC) - Primary   Reports nausea and  ABDOMEN with zoloft. She stopped med 4months ago. She also stopped ALCOHOL use 12/2022 and marijuana use 2months ago, no tobacco use, no illicit drug  use Reports poor sleep, poor appetite, lack of motivation, and fatigue for last 4months. No SI/HI/hallucination 11lbs weight loss noted in last 4months due to poor appetite, denies any ABDOMEN pain or nausea or change in BM since discontinuation of zoloft. Wt Readings from Last 3 Encounters:  10/16/23 151 lb (68.5 kg)  05/11/23 162 lb 6.4 oz (73.7 kg)  03/06/23 165 lb (74.8 kg)    Check THYROID, CBC, CMP Start remeron 7.5mg  at hs Entered referral to psychology F/up in 9month       Relevant Medications   mirtazapine (REMERON) 7.5 MG tablet   Elevated BP without diagnosis of hypertension   Advised to maintain DASH diet and monitor BP at home BP Readings from Last 3 Encounters:  10/16/23 (!) 140/80  05/11/23 120/70  03/06/23 122/80    F/up in 9month      Longitudinal melanonychia   Onset 1year ago, worsening, bilateral finger nails Normal radial pulse.  Entered referral to dermatology      Relevant Orders   Ambulatory referral to Dermatology   Polymyositis [facility])   Onset 2009 Managed by Dr. Erroll Luna with [facility]. Current use of oral prednisone, rituxan, and hydrocodone.(prescribed by Dr. Alphonzo Dublin per patient)      Other Visit Diagnoses       Weight loss, non-intentional       Relevant Orders   CBC   Comprehensive metabolic panel   TSH      Return in about 4 weeks (around 11/13/2023) for depression and anxiety, elevated BP and weight loss.     Alysia Penna, NP

## 2023-10-16 NOTE — Patient Instructions (Signed)
Maintain 3 balance meals-low salt daily, ok to add 1-2bottles of ensure protein max drink daily. Monitor BP at home daily Bring BP readings to next appointment. Go to lab  DASH Eating Plan DASH stands for Dietary Approaches to Stop Hypertension. The DASH eating plan is a healthy eating plan that has been shown to: Lower high blood pressure (hypertension). Reduce your risk for type 2 diabetes, heart disease, and stroke. Help with weight loss. What are tips for following this plan? Reading food labels Check food labels for the amount of salt (sodium) per serving. Choose foods with less than 5 percent of the Daily Value (DV) of sodium. In general, foods with less than 300 milligrams (mg) of sodium per serving fit into this eating plan. To find whole grains, look for the word "whole" as the first word in the ingredient list. Shopping Buy products labeled as "low-sodium" or "no salt added." Buy fresh foods. Avoid canned foods and pre-made or frozen meals. Cooking Try not to add salt when you cook. Use salt-free seasonings or herbs instead of table salt or sea salt. Check with your health care provider or pharmacist before using salt substitutes. Do not fry foods. Cook foods in healthy ways, such as baking, boiling, grilling, roasting, or broiling. Cook using oils that are good for your heart. These include olive, canola, avocado, soybean, and sunflower oil. Meal planning  Eat a balanced diet. This should include: 4 or more servings of fruits and 4 or more servings of vegetables each day. Try to fill half of your plate with fruits and vegetables. 6-8 servings of whole grains each day. 6 or less servings of lean meat, poultry, or fish each day. 1 oz is 1 serving. A 3 oz (85 g) serving of meat is about the same size as the palm of your hand. One egg is 1 oz (28 g). 2-3 servings of low-fat dairy each day. One serving is 1 cup (237 mL). 1 serving of nuts, seeds, or beans 5 times each week. 2-3  servings of heart-healthy fats. Healthy fats called omega-3 fatty acids are found in foods such as walnuts, flaxseeds, fortified milks, and eggs. These fats are also found in cold-water fish, such as sardines, salmon, and mackerel. Limit how much you eat of: Canned or prepackaged foods. Food that is high in trans fat, such as fried foods. Food that is high in saturated fat, such as fatty meat. Desserts and other sweets, sugary drinks, and other foods with added sugar. Full-fat dairy products. Do not salt foods before eating. Do not eat more than 4 egg yolks a week. Try to eat at least 2 vegetarian meals a week. Eat more home-cooked food and less restaurant, buffet, and fast food. Lifestyle When eating at a restaurant, ask if your food can be made with less salt or no salt. If you drink alcohol: Limit how much you have to: 0-1 drink a day if you are female. 0-2 drinks a day if you are female. Know how much alcohol is in your drink. In the [place] one drink is one 12 oz bottle of beer (355 mL), one 5 oz glass of wine (148 mL), or one 1 oz glass of hard liquor (44 mL). General information Avoid eating more than 2,300 mg of salt a day. If you have hypertension, you may need to reduce your sodium intake to 1,500 mg a day. Work with your provider to stay at a healthy body weight or lose weight. Ask what the best  weight range is for you. On most days of the week, get at least 30 minutes of exercise that causes your heart to beat faster. This may include walking, swimming, or biking. Work with your provider or dietitian to adjust your eating plan to meet your specific calorie needs. What foods should I eat? Fruits All fresh, dried, or frozen fruit. Canned fruits that are in their natural juice and do not have sugar added to them. Vegetables Fresh or frozen vegetables that are raw, steamed, roasted, or grilled. Low-sodium or reduced-sodium tomato and vegetable juice. Low-sodium or reduced-sodium  tomato sauce and tomato paste. Low-sodium or reduced-sodium canned vegetables. Grains Whole-grain or whole-wheat bread. Whole-grain or whole-wheat pasta. Brown rice. Orpah Cobb. Bulgur. Whole-grain and low-sodium cereals. Pita bread. Low-fat, low-sodium crackers. Whole-wheat flour tortillas. Meats and other proteins Skinless chicken or [place]. Ground chicken or [place]. Pork with fat trimmed off. Fish and seafood. Egg whites. Dried beans, peas, or lentils. Unsalted nuts, nut butters, and seeds. Unsalted canned beans. Lean cuts of beef with fat trimmed off. Low-sodium, lean precooked or cured meat, such as sausages or meat loaves. Dairy Low-fat (1%) or fat-free (skim) milk. Reduced-fat, low-fat, or fat-free cheeses. Nonfat, low-sodium ricotta or cottage cheese. Low-fat or nonfat yogurt. Low-fat, low-sodium cheese. Fats and oils Soft margarine without trans fats. Vegetable oil. Reduced-fat, low-fat, or light mayonnaise and salad dressings (reduced-sodium). Canola, safflower, olive, avocado, soybean, and sunflower oils. Avocado. Seasonings and condiments Herbs. Spices. Seasoning mixes without salt. Other foods Unsalted popcorn and pretzels. Fat-free sweets. The items listed above may not be all the foods and drinks you can have. Talk to a dietitian to learn more. What foods should I avoid? Fruits Canned fruit in a light or heavy syrup. Fried fruit. Fruit in cream or butter sauce. Vegetables Creamed or fried vegetables. Vegetables in a cheese sauce. Regular canned vegetables that are not marked as low-sodium or reduced-sodium. Regular canned tomato sauce and paste that are not marked as low-sodium or reduced-sodium. Regular tomato and vegetable juices that are not marked as low-sodium or reduced-sodium. Rosita Fire. Olives. Grains Baked goods made with fat, such as croissants, muffins, or some breads. Dry pasta or rice meal packs. Meats and other proteins Fatty cuts of meat. Ribs. Fried meat.  Tomasa Blase. Bologna, salami, and other precooked or cured meats, such as sausages or meat loaves, that are not lean and low in sodium. Fat from the back of a pig (fatback). Bratwurst. Salted nuts and seeds. Canned beans with added salt. Canned or smoked fish. Whole eggs or egg yolks. Chicken or [place] with skin. Dairy Whole or 2% milk, cream, and half-and-half. Whole or full-fat cream cheese. Whole-fat or sweetened yogurt. Full-fat cheese. Nondairy creamers. Whipped toppings. Processed cheese and cheese spreads. Fats and oils Butter. Stick margarine. Lard. Shortening. Ghee. Bacon fat. Tropical oils, such as coconut, palm kernel, or palm oil. Seasonings and condiments Onion salt, garlic salt, seasoned salt, table salt, and sea salt. Worcestershire sauce. Tartar sauce. Barbecue sauce. Teriyaki sauce. Soy sauce, including reduced-sodium soy sauce. Steak sauce. Canned and packaged gravies. Fish sauce. Oyster sauce. Cocktail sauce. Store-bought horseradish. Ketchup. Mustard. Meat flavorings and tenderizers. Bouillon cubes. Hot sauces. Pre-made or packaged marinades. Pre-made or packaged taco seasonings. Relishes. Regular salad dressings. Other foods Salted popcorn and pretzels. The items listed above may not be all the foods and drinks you should avoid. Talk to a dietitian to learn more. Where to find more information National Heart, Lung, and Blood Institute (NHLBI): BuffaloDryCleaner.gl American Heart Association (  AHA): heart.org Academy of Nutrition and Dietetics: eatright.org National Kidney Foundation (NKF): kidney.org This information is not intended to replace advice given to you by your health care provider. Make sure you discuss any questions you have with your health care provider. Document Revised: 08/31/2022 Document Reviewed: 08/31/2022 Elsevier Patient Education  2024 [employer].

## 2023-10-17 ENCOUNTER — Encounter: Payer: Self-pay | Admitting: Nurse Practitioner

## 2023-10-17 NOTE — Progress Notes (Signed)
Stable Follow instructions as discussed during office visit.

## 2023-10-29 ENCOUNTER — Telehealth (HOSPITAL_COMMUNITY): Payer: Self-pay | Admitting: Psychiatry

## 2023-11-03 ENCOUNTER — Other Ambulatory Visit: Payer: Self-pay | Admitting: Nurse Practitioner

## 2023-11-03 DIAGNOSIS — H6993 Unspecified Eustachian tube disorder, bilateral: Secondary | ICD-10-CM

## 2023-11-05 ENCOUNTER — Telehealth: Payer: Self-pay

## 2023-11-05 NOTE — Telephone Encounter (Signed)
 Preference of office, provider, location: [facility], [address], [address] [place], [place] [address]

## 2023-11-05 NOTE — Telephone Encounter (Signed)
 Copied from CRM [id number]. Topic: Referral - Request for Referral >> Nov 05, 2023  2:30 PM Alcus Dad wrote: Did the patient discuss referral with their provider in the last year? Yes (If No - schedule appointment) (If Yes - send message)  Appointment offered? Yes  Type of order/referral and detailed reason for visit: Dermatology, for patient nails and hands (rash and discoloration)  Preference of office, provider, location: [facility], [place], [address], [place] [address]  If referral order, have you been seen by this specialty before? No (If Yes, this issue or another issue? When? Where?  Can we respond through MyChart? Yes

## 2023-11-06 NOTE — Telephone Encounter (Signed)
 Done .. Letter sent to pt's mychart

## 2023-11-08 DIAGNOSIS — M3320 Polymyositis, organ involvement unspecified: Secondary | ICD-10-CM | POA: Diagnosis not present

## 2023-11-09 ENCOUNTER — Telehealth: Payer: Self-pay

## 2023-11-09 NOTE — Telephone Encounter (Signed)
 Copied from CRM [id number]. Topic: Referral - Status >> Nov 09, 2023  9:46 AM Kathryne Eriksson wrote: Reason for CRM: Dermatologist Referral >> Nov 09, 2023  9:50 AM Kathryne Eriksson wrote: Patient called in stating that [facility] doesn't accept her insurance. She was advised after speaking with [facility] that Nche, Bonna Gains, NP would have to go into the system internally and send the referral back. The referral is showing in the system but in order for them to pick it up, it has to be sent back and reassigned to them. Any further questions regarding this, please give patient a call at [phone]

## 2023-11-09 NOTE — Telephone Encounter (Signed)
 Copied from CRM [id number]. Topic: General - Other >> Nov 09, 2023 10:53 AM Fonda Kinder J wrote: Reason for CRM: [employer] rx called in stating they will ship the pts Prolia around May upon the pts request.   Noted. Dm/cma

## 2023-11-10 ENCOUNTER — Other Ambulatory Visit: Payer: Self-pay | Admitting: Nurse Practitioner

## 2023-11-10 DIAGNOSIS — F331 Major depressive disorder, recurrent, moderate: Secondary | ICD-10-CM

## 2023-11-19 ENCOUNTER — Ambulatory Visit (HOSPITAL_BASED_OUTPATIENT_CLINIC_OR_DEPARTMENT_OTHER): Admitting: Family

## 2023-11-19 ENCOUNTER — Ambulatory Visit: Payer: 59 | Admitting: Nurse Practitioner

## 2023-11-19 VITALS — BP 177/106 | HR 64 | Ht 68.0 in | Wt 152.0 lb

## 2023-11-19 DIAGNOSIS — F339 Major depressive disorder, recurrent, unspecified: Secondary | ICD-10-CM

## 2023-11-19 MED ORDER — DOXEPIN HCL 50 MG PO CAPS: 50.0000 mg | ORAL_CAPSULE | Freq: Every day | ORAL | 2 refills | Status: DC

## 2023-11-19 NOTE — Progress Notes (Addendum)
 Psychiatric Initial Adult Assessment   Patient Identification: SALEEN PEDEN MRN:  [id number] Date of Evaluation:  11/19/2023 Referral Source: Alysia Penna- NP  Chief Complaint:  Nicol stated " I ve been dealing with a lot, my whole life."   Visit Diagnosis:    ICD-10-CM   1. Depression, recurrent (HCC)  F33.9       History of Present Illness: Loreta Hettich 48 year old female presents to establish care.  She reports she was referred by her primary care provider.  She was seen and evaluated face-to-face.  Reports multiple stressors.  She is very tearful throughout this assessment.  Reports she had brain surgery and was diagnosed with a polymyositis. (Muscle disorder causes weakness pain and fatigue) reports this is because rapid weight loss.  Coupled with her wife of 5 years recently requested separation possibly divorce.  Reports some infidelity that she is aware of.  She reports passive suicidal ideations.  Denies plan or intent.  States " my grandson is keeping me here 53 years old."    Leota states she has a 28 year old daughter, states her daughter  had to move back home due to some financial stressors. Reproted that her daughter lost her fianc 6 months ago.  States in the midst of daughter moving back home, her daughter went into cardiac arrest and was hospitalized for few months.   Birdell reports she has tried Zoloft (nausea abdominal pain), Ambien and trazodone and does not feel Remeron is helping with her symptoms.  States struggling with racing thoughts, increased anxiety, depression, decreased energy and poor concentration.  Denied previous inpatient admissions.  Reported suicide attempt when she was younger.  Reported family history related to mental illness.  States her mother: is diagnosed with schizophrenia and substance abuse, brother: diagnosed with bipolar  Aalijah reports she is currently disabled however still works part-time [employer], reports she is currently working  on her GED.  reports marijuana use, PHQ-9 16 GAD-7 14  Major depressive disorder: Generalized anxiety:  Discontinue mirtazapine Remeron 7.5 mg nightly Initiated doxepin 50 mg nightly, patient to follow-up 1 week virtual for medication adherence/tolerability  During evaluation Dannelle R Olivero is sitting noted to be using a cane for ambulation assistance; she is alert/oriented x 4; calm/cooperative; and mood congruent with affect.  Patient is speaking in a clear tone at moderate volume, and normal pace; with good eye contact. Her thought process is coherent and relevant;   There is no indication that she is currently responding to internal/external stimuli or experiencing delusional thought content.  Patient denies suicidal/self-harm/homicidal ideation, psychosis, and paranoia.  Patient has remained calm throughout assessment and has answered questions appropriately.    Associated Signs/Symptoms: Depression Symptoms:  depressed mood, difficulty concentrating, anxiety, (Hypo) Manic Symptoms:  Distractibility, Irritable Mood, Anxiety Symptoms:  Excessive Worry, Psychotic Symptoms:  Hallucinations: None PTSD Symptoms: NA  Past Psychiatric History:   Previous Psychotropic Medications: Yes   Substance Abuse History in the last 12 months:  Yes.    Consequences of Substance Abuse: NA  Past Medical History:  Past Medical History:  Diagnosis Date   Anxiety    Hyperlipidemia    HYPERTENSION, BENIGN ESSENTIAL 06/16/2008   Qualifier: Diagnosis of  By: Daphine Deutscher FNP, [place]     Polymyositis [facility]) 2009   RLS (restless legs syndrome)    Vitamin D deficiency     Past Surgical History:  Procedure Laterality Date   BRAIN SURGERY      Family Psychiatric History:   Family History:  Family History  Problem Relation Age of Onset   Hepatitis Mother    Diabetes Paternal Uncle    Diabetes Maternal Grandmother    Heart disease Paternal Grandfather    Breast cancer Maternal Aunt        ?  age of onset    Social History:   Social History   Socioeconomic History   Marital status: Single    Spouse name: Not on file   Number of children: Not on file   Years of education: Not on file   Highest education level: 11th grade  Occupational History   Not on file  Tobacco Use   Smoking status: Former    Types: Cigarettes   Smokeless tobacco: Never  Vaping Use   Vaping status: Never Used  Substance and Sexual Activity   Alcohol use: Not Currently    Alcohol/week: 2.0 standard drinks of alcohol    Types: 1 Glasses of wine, 1 Shots of liquor per week    Comment: Occasional   Drug use: Yes    Frequency: 2.0 times per week    Types: Marijuana    Comment: Episodic   Sexual activity: Yes    Birth control/protection: None  Other Topics Concern   Not on file  Social History Narrative   Not on file   Social Drivers of Health   Financial Resource Strain: Low Risk  (10/15/2023)   Overall Financial Resource Strain (CARDIA)    Difficulty of Paying Living Expenses: Not hard at all  Food Insecurity: Low Risk  (11/08/2023)   Received from [facility]   Hunger Vital Sign    Within the past 12 months, you worried that your food would run out before you got money to buy more: Not on file    Ran Out of Food in the Last Year: Never true  Transportation Needs: No Transportation Needs (11/08/2023)   Received from [facility]    In the past 12 months, has lack of reliable transportation kept you from medical appointments, meetings, work or from getting things needed for daily living? : No  Physical Activity: Insufficiently Active (10/15/2023)   Exercise Vital Sign    Days of Exercise per Week: 3 days    Minutes of Exercise per Session: 30 min  Stress: Stress Concern Present (10/15/2023)   [employer] of Occupational Health - Occupational Stress Questionnaire    Feeling of Stress : To some extent  Social Connections: Moderately Isolated (10/15/2023)   Social  Connection and Isolation Panel [NHANES]    Frequency of Communication with Friends and Family: More than three times a week    Frequency of Social Gatherings with Friends and Family: Once a week    Attends Religious Services: More than 4 times per year    Active Member of [employer] or Organizations: No    Attends Banker Meetings: Never    Marital Status: Separated    Additional Social History:   Allergies:   Allergies  Allergen Reactions   Buspirone Nausea Only   Paxil  [Paroxetine Hcl] Nausea Only    Metabolic Disorder Labs: Lab Results  Component Value Date   HGBA1C 5.2 11/03/2021   No results found for: "PROLACTIN" Lab Results  Component Value Date   CHOL 227 (H) 05/11/2023   TRIG 64.0 05/11/2023   HDL 57.70 05/11/2023   CHOLHDL 4 05/11/2023   VLDL 12.8 05/11/2023   LDLCALC 156 (H) 05/11/2023   LDLCALC 172 (H) 05/08/2022  Lab Results  Component Value Date   TSH 0.76 10/16/2023    Therapeutic Level Labs: No results found for: "LITHIUM" No results found for: "CBMZ" No results found for: "VALPROATE"  Current Medications: Current Outpatient Medications  Medication Sig Dispense Refill   Cholecalciferol (VITAMIN D3) 25 MCG (1000 UT) CAPS Take by mouth.     cromolyn (OPTICROM) 4 % ophthalmic solution Place 1 drop into both eyes 4 (four) times daily.     cycloSPORINE (RESTASIS) 0.05 % ophthalmic emulsion 1 drop 2 (two) times daily.     denosumab (PROLIA) 60 MG/ML SOSY injection Inject 60 mg into the skin every 6 (six) months. 1 mL 1   HYDROcodone-acetaminophen (NORCO/VICODIN) 5-325 MG tablet Take 1 tablet by mouth every 6 (six) hours as needed. 60 tablet 0   ibuprofen (ADVIL) 600 MG tablet TAKE 1 TABLET (600 MG TOTAL) BY MOUTH EVERY 12 (TWELVE) HOURS AS NEEDED (WITH FOOD). 60 tablet 2   Multiple Vitamins-Minerals (ONE-A-DAY VITACRAVES ADULT) CHEW Chew 1 each by mouth daily.     predniSONE (DELTASONE) 2.5 MG tablet Take 2.5 mg by mouth daily.     riTUXimab  (RITUXAN) 500 MG/50ML injection Inject 1,000 mg into the vein every 6 (six) months.      No current facility-administered medications for this visit.    Musculoskeletal: Strength & Muscle Tone: within normal limits Gait & Station: normal Patient leans: N/A  Psychiatric Specialty Exam: Review of Systems  Psychiatric/Behavioral:  Positive for decreased concentration and sleep disturbance. Negative for self-injury. The patient is nervous/anxious.   All other systems reviewed and are negative.   There were no vitals taken for this visit.There is no height or weight on file to calculate BMI.  General Appearance: Casual  Eye Contact:  Good  Speech:  Clear and Coherent  Volume:  Normal  Mood:  Anxious and Depressed  Affect:  Congruent  Thought Process:  Coherent  Orientation:  Full (Time, Place, and Person)  Thought Content:  Logical  Suicidal Thoughts:  No  Homicidal Thoughts:  No  Memory:  Immediate;   Good Recent;   Good  Judgement:  Good  Insight:  Good  Psychomotor Activity:  Normal  Concentration:  Concentration: Good  Recall:  Good  Fund of Knowledge:Good  Language: Good  Akathisia:  No  Handed:  Right  AIMS (if indicated):  done  Assets:  Communication Skills Desire for Improvement Resilience Social Support  ADL's:  Intact  Cognition: WNL  Sleep:  NA   Screenings: AIMS    Flowsheet Row [facility]  AIMS Total Score 0      AUDIT    Flowsheet Row Counselor from 10/04/2020 in [facility]Discharged[facility]  Alcohol Use Disorder Identification Test Final Score (AUDIT) 4 0      GAD-7    Flowsheet Row Office Visit from 10/16/2023 in [facility] [place] [facility] [employer] Visit from 05/11/2023 in [facility] [employer] Visit from 05/08/2022  in [facility] [place] [facility] [employer] Visit from 11/03/2021 in [facility] [employer] from 02/17/2021 in [facility]  Total GAD-7 Score 7 7 0 0 2      PHQ2-9    Flowsheet Row Office Visit from 10/16/2023 in [facility] Clinical Support from 09/24/2023 in  [facility] Visit from 05[facility]11/2023 in [facility] Visit from 03/06/2023 in [facility] [place] [facility] [employer] Clinical Support from 09/22/2022 in [facility] [place] [facility] [employer]  PHQ-2 Total Score 2 1 0 0 0  PHQ-9 Total Score 7 -- 3 -- --      Advertising copywriter from 02/17/2021 in [facility] Counselor from 01/06/2021 in [facility] Office Visit from 10/05/2020 in [facility] at [place]  C-SSRS RISK CATEGORY Error: Question 1 not populated No Risk Error: Question 6 not populated       Assessment and Plan: Annamary Butsch 48 year old female presents to establish care.  She reports struggling with depression and anxiety symptoms.  Reports multiple medical issues that has attributed to her depression.  She reports a recent separation/possible divorce from her wife.  Reports she was initiated on Remeron 7.5 mg daily however reports she does not feel the medications is helping with her symptoms.  Reports her most pressing issue is racing thoughts and sleep disturbance.  Will address depression and anxiety at subsequent appointment.  Discussed initiating doxepin 50 mg nightly for sleep disturbance  Collaboration of Care: Medication Management AEB patient to start doxepin 50 mg  -Keep follow-up appointment with therapist Cyril Loosen   Patient/Guardian was advised Release of Information must be obtained prior to any record release in order to  collaborate their care with an outside provider. Patient/Guardian was advised if they have not already done so to contact the registration department to sign all necessary forms in order for [place] to release information regarding their care.   Consent: Patient/Guardian gives verbal consent for treatment and assignment of benefits for services provided during this visit. Patient/Guardian expressed understanding and agreed to proceed.   Oneta Rack, NP 3/24/20252:19 PM

## 2023-11-21 ENCOUNTER — Ambulatory Visit (INDEPENDENT_AMBULATORY_CARE_PROVIDER_SITE_OTHER): Admitting: Nurse Practitioner

## 2023-11-21 ENCOUNTER — Encounter: Payer: Self-pay | Admitting: Nurse Practitioner

## 2023-11-21 VITALS — BP 140/80 | HR 64 | Temp 98.0°F | Ht 68.5 in | Wt 148.2 lb

## 2023-11-21 DIAGNOSIS — I10 Essential (primary) hypertension: Secondary | ICD-10-CM

## 2023-11-21 DIAGNOSIS — R634 Abnormal weight loss: Secondary | ICD-10-CM

## 2023-11-21 MED ORDER — AMLODIPINE BESYLATE 5 MG PO TABS: 5.0000 mg | ORAL_TABLET | Freq: Every day | ORAL | 1 refills | Status: DC

## 2023-11-21 NOTE — Progress Notes (Signed)
 Established Patient Visit  Patient: [name]   DOB: Nov 24, 1975   48 y.o. Female  MRN: [id number] Visit Date: 11/21/2023  Subjective:    Chief Complaint  Patient presents with   Follow-up    Follow for HTN, Depression and anxiety,  Requesting refills on prednisone    HPI Weight loss, non-intentional Lost 14lbs in last 6months. Reports early satiety, and bloating postprandial No constipation, no diarrhea, no melena or hematochezia. Normal CMP, CBC and THYROID Cologuard completed 04/2023: negative  Get CXR and CT ABDOMEN/pelvis. Advised to drink ensure protein max 1-2bottles a day, and maintain small frequent meals F/up in 82month  HTN (hypertension) Elevated BP for last 1week. Normal CBC, cmp, and Tsh No tobacco or illicit drug use ALCOHOL use 1x/month-1-2glasses of wine BP Readings from Last 3 Encounters:  11/21/23 (!) 140/80  10/16/23 (!) 140/80  05/11/23 120/70    Start amlodipine 5mg  in PM Advised to maintain DASH diet F/up in 82month    Reviewed medical, surgical, and social history today  Medications: Outpatient Medications Prior to Visit  Medication Sig   Cholecalciferol (VITAMIN D3) 25 MCG (1000 UT) CAPS Take by mouth.   cromolyn (OPTICROM) 4 % ophthalmic solution Place 1 drop into both eyes as needed.   cycloSPORINE (RESTASIS) 0.05 % ophthalmic emulsion 1 drop 2 (two) times daily.   denosumab (PROLIA) 60 MG/ML SOSY injection Inject 60 mg into the skin every 6 (six) months.   doxepin (SINEQUAN) 50 MG capsule Take 1 capsule (50 mg total) by mouth at bedtime.   HYDROcodone-acetaminophen (NORCO/VICODIN) 5-325 MG tablet Take 1 tablet by mouth every 6 (six) hours as needed.   ibuprofen (ADVIL) 600 MG tablet TAKE 1 TABLET (600 MG TOTAL) BY MOUTH EVERY 12 (TWELVE) HOURS AS NEEDED (WITH FOOD).   Multiple Vitamins-Minerals (ONE-A-DAY VITACRAVES ADULT) CHEW Chew 1 each by mouth daily.   predniSONE (DELTASONE) 2.5 MG tablet Take 2.5 mg by mouth daily.    riTUXimab (RITUXAN) 500 MG/50ML injection Inject 1,000 mg into the vein every 6 (six) months.    valACYclovir (VALTREX) 500 MG tablet Take 500 mg by mouth as needed (outbreak). Take 1 tablet as needed for onset out breaks   No facility-administered medications prior to visit.   Reviewed past medical and social history.   ROS per HPI above      Objective:  BP (!) 140/80 (BP Location: Left Arm, Patient Position: Sitting)   Pulse 64   Temp 98 F (36.7 C) (Temporal)   Ht 5' 8.5" (1.74 m)   Wt 148 lb 3.2 oz (67.2 kg)   LMP 11/02/2023   SpO2 100%   BMI 22.21 kg/m      Physical Exam Cardiovascular:     Rate and Rhythm: Normal rate and regular rhythm.     Pulses: Normal pulses.     Heart sounds: Normal heart sounds.  Pulmonary:     Effort: Pulmonary effort is normal.     Breath sounds: Normal breath sounds.  Musculoskeletal:     Right lower leg: No edema.     Left lower leg: No edema.  Neurological:     Mental Status: She is alert and oriented to person, place, and time.     No results found for any visits on 11/21/23.    Assessment & Plan:    Problem List Items Addressed This Visit     HTN (hypertension)   Elevated BP  for last 1week. Normal CBC, cmp, and Tsh No tobacco or illicit drug use ALCOHOL use 1x/month-1-2glasses of wine BP Readings from Last 3 Encounters:  11/21/23 (!) 140/80  10/16/23 (!) 140/80  05/11/23 120/70    Start amlodipine 5mg  in PM Advised to maintain DASH diet F/up in 19month      Relevant Medications   amLODipine (NORVASC) 5 MG tablet   Weight loss, non-intentional - Primary   Lost 14lbs in last 6months. Reports early satiety, and bloating postprandial No constipation, no diarrhea, no melena or hematochezia. Normal CMP, CBC and THYROID Cologuard completed 04/2023: negative  Get CXR and CT ABDOMEN/pelvis. Advised to drink ensure protein max 1-2bottles a day, and maintain small frequent meals F/up in 19month      Relevant Orders    CT ABDOMEN PELVIS WO CONTRAST   DG Chest 2 View   Return in about 4 weeks (around 12/19/2023) for HTN and weight loss (get ECG).     Alysia Penna, NP

## 2023-11-21 NOTE — Patient Instructions (Signed)
 Go to [address] for CXR  You will be contacted to schedule appointment for CT Start amlodipine 5mg  in PM Drink 1-2bottles of ensure daily.  Mediterranean Diet A Mediterranean diet is based on the traditions of countries on the [employer]. It focuses on eating more: Fruits and vegetables. Whole grains, beans, nuts, and seeds. Heart-healthy fats. These are fats that are good for your heart. It involves eating less: Dairy. Meat and eggs. Processed foods with added sugar, salt, and fat. This type of diet can help prevent certain conditions. It can also improve outcomes if you have a long-term (chronic) disease, such as kidney or heart disease. What are tips for following this plan? Reading food labels Check packaged foods for: The serving size. For foods such as rice and pasta, the serving size is the amount of cooked product, not dry. The total fat. Avoid foods with saturated fat or trans fat. Added sugars, such as corn syrup. Shopping  Try to have a balanced diet. Buy a variety of foods, such as: Fresh fruits and vegetables. You may be able to get these from local farmers markets. You can also buy them frozen. Grains, beans, nuts, and seeds. Some of these can be bought in bulk. Fresh seafood. Poultry and eggs. Low-fat dairy products. Buy whole ingredients instead of foods that have already been packaged. If you can't get fresh seafood, buy precooked frozen shrimp or canned fish, such as tuna, salmon, or sardines. Stock your pantry so you always have certain foods on hand, such as olive oil, canned tuna, canned tomatoes, rice, pasta, and beans. Cooking Cook foods with extra-virgin olive oil instead of using butter or other vegetable oils. Have meat as a side dish. Have vegetables or grains as your main dish. This means having meat in small portions or adding small amounts of meat to foods like pasta or stew. Use beans or vegetables instead of meat in common dishes like  chili or lasagna. Try out different cooking methods. Try roasting, broiling, steaming, and sauting vegetables. Add frozen vegetables to soups, stews, pasta, or rice. Add nuts or seeds for added healthy fats and plant protein at each meal. You can add these to yogurt, salads, or vegetable dishes. Marinate fish or vegetables using olive oil, lemon juice, garlic, and fresh herbs. Meal planning Plan to eat a vegetarian meal one day each week. Try to work up to two vegetarian meals, if possible. Eat seafood two or more times a week. Have healthy snacks on hand. These may include: Vegetable sticks with hummus. Greek yogurt. Fruit and nut trail mix. Eat balanced meals. These should include: Fruit: 2-3 servings a day. Vegetables: 4-5 servings a day. Low-fat dairy: 2 servings a day. Fish, poultry, or lean meat: 1 serving a day. Beans and legumes: 2 or more servings a week. Nuts and seeds: 1-2 servings a day. Whole grains: 6-8 servings a day. Extra-virgin olive oil: 3-4 servings a day. Limit red meat and sweets to just a few servings a month. Lifestyle  Try to cook and eat meals with your family. Drink enough fluid to keep your pee (urine) pale yellow. Be active every day. This includes: Aerobic exercise, which is exercise that causes your heart to beat faster. Examples include running and swimming. Leisure activities like gardening, walking, or housework. Get 7-8 hours of sleep each night. Drink red wine if your provider says you can. A glass of wine is 5 oz (150 mL). You may be allowed to have: Up  to 1 glass a day if you're female and not pregnant. Up to 2 glasses a day if you're female. What foods should I eat? Fruits Apples. Apricots. Avocado. Berries. Bananas. Cherries. Dates. Figs. Grapes. Lemons. Melon. Oranges. Peaches. Plums. Pomegranate. Vegetables Artichokes. Beets. Broccoli. Cabbage. Carrots. Eggplant. Green beans. Chard. Kale. Spinach. Onions. Leeks. Peas. Squash. Tomatoes.  Peppers. Radishes. Grains Whole-grain pasta. Brown rice. Bulgur wheat. Polenta. Couscous. Whole-wheat bread. Orpah Cobb. Meats and other proteins Beans. Almonds. Sunflower seeds. Pine nuts. Peanuts. Cod. Salmon. Scallops. Shrimp. Tuna. Tilapia. Clams. Oysters. Eggs. Chicken or [place] without skin. Dairy Low-fat milk. Cheese. Greek yogurt. Fats and oils Extra-virgin olive oil. Avocado oil. Grapeseed oil. Beverages Water. Red wine. Herbal tea. Sweets and desserts Greek yogurt with honey. Baked apples. Poached pears. Trail mix. Seasonings and condiments Basil. Cilantro. Coriander. Cumin. Mint. Parsley. Sage. Rosemary. Tarragon. Garlic. Oregano. Thyme. Pepper. Balsamic vinegar. Tahini. Hummus. Tomato sauce. Olives. Mushrooms. The items listed above may not be all the foods and drinks you can have. Talk to a dietitian to learn more. What foods should I limit? This is a list of foods that should be eaten rarely. Fruits Fruit canned in syrup. Vegetables Deep-fried potatoes, like [place] fries. Grains Packaged pasta or rice dishes. Cereal with added sugar. Snacks with added sugar. Meats and other proteins Beef. Pork. Lamb. Chicken or [place] with skin. Hot dogs. Tomasa Blase. Dairy Ice cream. Sour cream. Whole milk. Fats and oils Butter. Canola oil. Vegetable oil. Beef fat (tallow). Lard. Beverages Juice. Sugar-sweetened soft drinks. Beer. Liquor and spirits. Sweets and desserts Cookies. Cakes. Pies. Candy. Seasonings and condiments Mayonnaise. Pre-made sauces and marinades. The items listed above may not be all the foods and drinks you should limit. Talk to a dietitian to learn more. Where to find more information American Heart Association (AHA): heart.org This information is not intended to replace advice given to you by your health care provider. Make sure you discuss any questions you have with your health care provider. Document Revised: 11/26/2022 Document Reviewed:  11/26/2022 Elsevier Patient Education  2024 [employer].

## 2023-11-21 NOTE — Assessment & Plan Note (Addendum)
 Elevated BP for last 1week. Normal CBC, cmp, and Tsh No tobacco or illicit drug use ALCOHOL use 1x/month-1-2glasses of wine BP Readings from Last 3 Encounters:  11/21/23 (!) 140/80  10/16/23 (!) 140/80  05/11/23 120/70    Start amlodipine 5mg  in PM Advised to maintain DASH diet F/up in 24month

## 2023-11-21 NOTE — Assessment & Plan Note (Addendum)
 Lost 14lbs in last 6months. Reports early satiety, and bloating postprandial No constipation, no diarrhea, no melena or hematochezia. Normal CMP, CBC and THYROID Cologuard completed 04/2023: negative  Get CXR and CT ABDOMEN/pelvis. Advised to drink ensure protein max 1-2bottles a day, and maintain small frequent meals F/up in 17month

## 2023-11-27 ENCOUNTER — Telehealth (HOSPITAL_COMMUNITY): Admitting: Family

## 2023-11-27 DIAGNOSIS — F339 Major depressive disorder, recurrent, unspecified: Secondary | ICD-10-CM

## 2023-11-27 DIAGNOSIS — G479 Sleep disorder, unspecified: Secondary | ICD-10-CM

## 2023-11-27 NOTE — Progress Notes (Signed)
 Virtual Visit via Video Note  I connected with [name] on 11/27/23 at  7:00 AM EDT by a video enabled telemedicine application and verified that I am speaking with the correct person using two identifiers.  Location: Patient: Home Provider: Office    I discussed the limitations of evaluation and management by telemedicine and the availability of in person appointments. The patient expressed understanding and agreed to proceed.   I discussed the assessment and treatment plan with the patient. The patient was provided an opportunity to ask questions and all were answered. The patient agreed with the plan and demonstrated an understanding of the instructions.   The patient was advised to call back or seek an in-person evaluation if the symptoms worsen or if the condition fails to improve as anticipated.  I provided 10 minutes of non-face-to-face time during this encounter.   Lori Rack, NP   [facility] MD/PA/NP OP Progress Note  11/27/2023 7:57 AM [name]  MRN:  [id number]  Chief Complaint:  Medication management  follow-up  HPI: [name] 48 year old African-American female presents for medication management follow-up appointment.  She was seen and evaluated via care agility.  She presents with a bright and pleasant affect.  She was initiated on doxepin 50 mg nightly which she reports she has been taking and tolerating well.  Recently discontinued from mirtazapine.  She is denying any medication side effects during this appointment.  States overall her mood has improved.  States she has followed up with her primary care provider for medication for her hypertension.  She denied any other concerns at this visit.  Reports a fair appetite.  Discussed following 2 months for medication management consideration for following up with therapy services.  She was receptive to plan.  Support, encouragement and reassurance was provided.  Anuja observed working.  Stated " I almost  canceled this appointment because I got called into work at the last minute."  She is alert oriented x 3.  She is pleasant, calm and cooperative affect.  Mood congruent speech clear tone normal volume.  No concerns related to suicidal or homicidal ideations.  Patient does not appear to be responding to internal or external stimuli.  Discussed continuing to take medications as indicated.  She answered all questions appropriately during this assessment.   Visit Diagnosis:    ICD-10-CM   1. Depression, recurrent (HCC)  F33.9     2. Sleep disturbance  G47.9       Past Psychiatric History:   Past Medical History:  Past Medical History:  Diagnosis Date   Anxiety    Hyperlipidemia    HYPERTENSION, BENIGN ESSENTIAL 06/16/2008   Qualifier: Diagnosis of  By: Daphine Deutscher FNP, [place]     Polymyositis [facility] 2009   RLS (restless legs syndrome)    Vitamin D deficiency     Past Surgical History:  Procedure Laterality Date   BRAIN SURGERY      Family Psychiatric History:   Family History:  Family History  Problem Relation Age of Onset   Hepatitis Mother    Diabetes Paternal Uncle    Diabetes Maternal Grandmother    Heart disease Paternal Grandfather    Breast cancer Maternal Aunt        ? age of onset    Social History:  Social History   Socioeconomic History   Marital status: Single    Spouse name: Not on file   Number of children: Not on file   Years of education:  Not on file   Highest education level: 11th grade  Occupational History   Not on file  Tobacco Use   Smoking status: Former    Types: Cigarettes   Smokeless tobacco: Never  Vaping Use   Vaping status: Never Used  Substance and Sexual Activity   Alcohol use: Not Currently    Alcohol/week: 2.0 standard drinks of alcohol    Types: 1 Glasses of wine, 1 Shots of liquor per week    Comment: Occasional   Drug use: Yes    Frequency: 2.0 times per week    Types: Marijuana    Comment: Episodic   Sexual activity: Yes     Birth control/protection: None  Other Topics Concern   Not on file  Social History Narrative   Not on file   Social Drivers of Health   Financial Resource Strain: Low Risk  (10/15/2023)   Overall Financial Resource Strain (CARDIA)    Difficulty of Paying Living Expenses: Not hard at all  Food Insecurity: Low Risk  (11/08/2023)   Received from [facility]   Hunger Vital Sign    Within the past 12 months, you worried that your food would run out before you got money to buy more: Not on file    Ran Out of Food in the Last Year: Never true  Transportation Needs: No Transportation Needs (11/08/2023)   Received from [facility]    In the past 12 months, has lack of reliable transportation kept you from medical appointments, meetings, work or from getting things needed for daily living? : No  Physical Activity: Insufficiently Active (10/15/2023)   Exercise Vital Sign    Days of Exercise per Week: 3 days    Minutes of Exercise per Session: 30 min  Stress: Stress Concern Present (10/15/2023)   [employer] of Occupational Health - Occupational Stress Questionnaire    Feeling of Stress : To some extent  Social Connections: Moderately Isolated (10/15/2023)   Social Connection and Isolation Panel [NHANES]    Frequency of Communication with Friends and Family: More than three times a week    Frequency of Social Gatherings with Friends and Family: Once a week    Attends Religious Services: More than 4 times per year    Active Member of [employer] or Organizations: No    Attends Banker Meetings: Never    Marital Status: Separated    Allergies:  Allergies  Allergen Reactions   Buspirone Nausea Only   Lisinopril Anaphylaxis    angioedema   Paxil  [Paroxetine Hcl] Nausea Only    Metabolic Disorder Labs: Lab Results  Component Value Date   HGBA1C 5.2 11/03/2021   No results found for: "PROLACTIN" Lab Results  Component Value Date   CHOL 227 (H)  05/11/2023   TRIG 64.0 05/11/2023   HDL 57.70 05/11/2023   CHOLHDL 4 05/11/2023   VLDL 12.8 05/11/2023   LDLCALC 156 (H) 05/11/2023   LDLCALC 172 (H) 05/08/2022   Lab Results  Component Value Date   TSH 0.76 10/16/2023   TSH 0.94 05/11/2023    Therapeutic Level Labs: No results found for: "LITHIUM" No results found for: "VALPROATE" No results found for: "CBMZ"  Current Medications: Current Outpatient Medications  Medication Sig Dispense Refill   amLODipine (NORVASC) 5 MG tablet Take 1 tablet (5 mg total) by mouth at bedtime. 90 tablet 1   Cholecalciferol (VITAMIN D3) 25 MCG (1000 UT) CAPS Take by mouth.     cromolyn (  OPTICROM) 4 % ophthalmic solution Place 1 drop into both eyes as needed.     cycloSPORINE (RESTASIS) 0.05 % ophthalmic emulsion 1 drop 2 (two) times daily.     denosumab (PROLIA) 60 MG/ML SOSY injection Inject 60 mg into the skin every 6 (six) months. 1 mL 1   doxepin (SINEQUAN) 50 MG capsule Take 1 capsule (50 mg total) by mouth at bedtime. 30 capsule 2   HYDROcodone-acetaminophen (NORCO/VICODIN) 5-325 MG tablet Take 1 tablet by mouth every 6 (six) hours as needed. 60 tablet 0   ibuprofen (ADVIL) 600 MG tablet TAKE 1 TABLET (600 MG TOTAL) BY MOUTH EVERY 12 (TWELVE) HOURS AS NEEDED (WITH FOOD). 60 tablet 2   Multiple Vitamins-Minerals (ONE-A-DAY VITACRAVES ADULT) CHEW Chew 1 each by mouth daily.     predniSONE (DELTASONE) 2.5 MG tablet Take 2.5 mg by mouth daily.     riTUXimab (RITUXAN) 500 MG/50ML injection Inject 1,000 mg into the vein every 6 (six) months.      valACYclovir (VALTREX) 500 MG tablet Take 500 mg by mouth as needed (outbreak). Take 1 tablet as needed for onset out breaks     No current facility-administered medications for this visit.     Musculoskeletal: Strength & Muscle Tone: within normal limits Gait & Station: normal Patient leans: N/A  Psychiatric Specialty Exam: Review of Systems  Psychiatric/Behavioral:  Sleep disturbance: improving.    All other systems reviewed and are negative.   Last menstrual period 11/02/2023.There is no height or weight on file to calculate BMI.  General Appearance: Casual  Eye Contact:  Good  Speech:  Clear and Coherent  Volume:  Normal  Mood:  Anxious and Depressed  Affect:  Congruent  Thought Process:  Coherent  Orientation:  Full (Time, Place, and Person)  Thought Content: Logical   Suicidal Thoughts:  No  Homicidal Thoughts:  No  Memory:  Immediate;   Good  Judgement:  Good  Insight:  Good  Psychomotor Activity:  Normal  Concentration:  Concentration: Good  Recall:  Good  Fund of Knowledge: Good  Language: Good  Akathisia:  No  Handed:  Right  AIMS (if indicated): not done  Assets:  Communication Skills Desire for Improvement Resilience Social Support  ADL's:  Intact  Cognition: WNL  Sleep:  Fair reported 6 to 7 hours, when she doesn't have to be at work 4 am.    Screenings: AIMS    Flowsheet Row [facility]  AIMS Total Score 0      AUDIT    Flowsheet Row Counselor from 10/04/2020 in [facility]  Alcohol Use Disorder Identification Test Final Score (AUDIT) 4 0      GAD-7    Flowsheet Row Office Visit from 10/16/2023 in [facility] [employer] Visit from 05/11/2023 in [facility] [place] [facility] [employer] [facility] Visit from 05/08/2022 in [facility] [employer] Visit from 11/03/2021 in [facility] [employer] from 02/17/2021 in [facility]  Total GAD-7 Score 7 7 0 0 2      PHQ2-9    Flowsheet Row Office Visit from 10/16/2023 in [facility] Clinical Support from 09/24/2023 in [facility] [employer] Visit from 05/11/2023 in [facility] [place] [facility] [place] [facility] Visit from 03/06/2023 in [facility] [employer] [facility] [employer] Clinical Support from 09/22/2022 in [facility] [place] [facility] [employer]  PHQ-2 Total Score 2 1 0 0 0  PHQ-9 Total Score 7 -- 3 -- --      Advertising copywriter from 02/17/2021 in [facility] Counselor from 01/06/2021 in [facility] Office Visit from 10/05/2020 in [facility] [place]  C-SSRS RISK CATEGORY Error: Question 1 not populated No Risk Error: Question 6 not populated        Assessment and Plan: [name] 48 year old presents for medication management follow-up appointment.  This provider discontinued Remeron 7.5 nightly and initiated doxepin which she reports she has been taking and tolerating well for the past week.  No documented concerns related to medication side effects.  Patient encouraged to follow-up with therapy services to continue to address depression symptoms.  Patient to follow-up 2 months for medication management.  Support encouragement reassurance was provided. -Reports recently starting medications related to her hypertension.  Reports her mood and her health has stabilized since previous visit.  Collaboration of Care: Collaboration of Care: Medication Management AEB continue doxepin 50 mg nightly  Patient/Guardian was advised Release of Information must be obtained prior to any record release in order to collaborate their care with an outside provider. Patient/Guardian was advised if they have not already done so to contact the registration department to sign all necessary forms in order for [place] to release information regarding their care.   Consent: Patient/Guardian gives verbal consent for treatment and assignment of benefits for services provided during this visit. Patient/Guardian expressed  understanding and agreed to proceed.    Lori Rack, NP 11/27/2023, 7:57 AM

## 2023-11-29 ENCOUNTER — Ambulatory Visit
Admission: RE | Admit: 2023-11-29 | Discharge: 2023-11-29 | Disposition: A | Discharge status: Home / Self Care | Source: Ambulatory Visit | Attending: Nurse Practitioner | Admitting: Nurse Practitioner

## 2023-11-29 DIAGNOSIS — R634 Abnormal weight loss: Secondary | ICD-10-CM

## 2023-11-30 ENCOUNTER — Encounter: Payer: Self-pay | Admitting: Nurse Practitioner

## 2023-12-12 ENCOUNTER — Other Ambulatory Visit (HOSPITAL_COMMUNITY): Payer: Self-pay | Admitting: Family

## 2023-12-13 ENCOUNTER — Ambulatory Visit: Admitting: Nurse Practitioner

## 2023-12-14 ENCOUNTER — Ambulatory Visit (INDEPENDENT_AMBULATORY_CARE_PROVIDER_SITE_OTHER): Admitting: Licensed Clinical Social Worker

## 2023-12-14 DIAGNOSIS — F411 Generalized anxiety disorder: Secondary | ICD-10-CM

## 2023-12-14 DIAGNOSIS — F339 Major depressive disorder, recurrent, unspecified: Secondary | ICD-10-CM
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
## 2023-12-20 DIAGNOSIS — M3320 Polymyositis, organ involvement unspecified: Secondary | ICD-10-CM | POA: Diagnosis not present

## 2023-12-21 ENCOUNTER — Telehealth: Payer: Self-pay | Admitting: Nurse Practitioner

## 2023-12-21 DIAGNOSIS — G2581 Restless legs syndrome: Secondary | ICD-10-CM

## 2023-12-21 DIAGNOSIS — G950 Syringomyelia and syringobulbia: Secondary | ICD-10-CM

## 2023-12-21 DIAGNOSIS — M3320 Polymyositis, organ involvement unspecified: Secondary | ICD-10-CM

## 2023-12-21 DIAGNOSIS — Z7952 Long term (current) use of systemic steroids: Secondary | ICD-10-CM

## 2023-12-21 DIAGNOSIS — G935 Compression of brain: Secondary | ICD-10-CM

## 2023-12-21 NOTE — Telephone Encounter (Signed)
 Called patient and explained the reason for my call which was to ask "and would the doctor to what?" She stated that there was anything else just was wanting a referral to a [facility] neurologist or a neurologist in the [place] area since she is wanting all of her providers close to home and not at [facility] system. I asked what she is seeing neurology for and she stated that she has a muscle disorder that she has. I thanked her for taking my call and informed her that I will give her a call back to let her know where the referral was sent to. She thanked me for calling.

## 2023-12-21 NOTE — Telephone Encounter (Signed)
 Patient called and made aware of where the referral was sent to [facility] and that someone from their office will be contacting her to get scheduled for an appointment. She thanked me for calling and all (if any) questions were answered. She thanked me for calling her back so quickly.

## 2023-12-21 NOTE — Telephone Encounter (Signed)
 Copied from CRM [id number]. Topic: Referral - Question >> Dec 21, 2023 12:33 PM Jethro Morrison wrote: Reason for CRM: PT IS CALLING REQUESTING A NEUROLOGY DOCTOR IN [place] AND WOULD THE DOCTOR

## 2023-12-24 ENCOUNTER — Telehealth: Payer: Self-pay

## 2023-12-24 NOTE — Telephone Encounter (Signed)
 DPR on file there is not a box marked showing if a detailed message can be left. Left a general voice message asking to give me a call back at the office at [phone]. I will try calling patient again.

## 2023-12-25 ENCOUNTER — Telehealth: Payer: Self-pay | Admitting: Nurse Practitioner

## 2023-12-25 ENCOUNTER — Ambulatory Visit (HOSPITAL_COMMUNITY): Admitting: Licensed Clinical Social Worker

## 2023-12-25 ENCOUNTER — Encounter (HOSPITAL_COMMUNITY): Payer: Self-pay

## 2023-12-25 DIAGNOSIS — F411 Generalized anxiety disorder: Secondary | ICD-10-CM

## 2023-12-25 DIAGNOSIS — F339 Major depressive disorder, recurrent, unspecified: Secondary | ICD-10-CM | POA: Diagnosis not present

## 2023-12-25 NOTE — Telephone Encounter (Signed)
 Called patient back and informed her of the following from [facility] sent to me in a referral message:  Please inform patient about Dr. Basilio Both message below.  Ask if she is ok with referral to rheumatology or maintaining care with [facility] health?   CN   Patient informed and she stated that she would like to do her research on local providers and give our office a call back if she can not find anyone to send a rheumatology referral. She thanked me for calling her back.

## 2023-12-25 NOTE — Telephone Encounter (Signed)
 Pt returned your call.

## 2023-12-25 NOTE — Progress Notes (Unsigned)
 THERAPIST PROGRESS NOTE   Session Date: 12/25/2023  Session Time: 1309- 1353 Virtual Visit via Video Note  I connected with [name] on 12/25/23 at  1:00 PM EDT by a video enabled telemedicine application and verified that I am speaking with the correct person using two identifiers.  Location: Patient: Home Provider: Home Office   I discussed the limitations of evaluation and management by telemedicine and the availability of in person appointments. The patient expressed understanding and agreed to proceed.  The patient was advised to call back or seek an in-person evaluation if the symptoms worsen or if the condition fails to improve as anticipated.  I provided 45 minutes of non-face-to-face time during this encounter.  Participation Level: Active  Behavioral Response: CasualAlertDysphoric  Type of Therapy: Individual Therapy  Treatment Goals addressed:  LTG: Reduce frequency, intensity, and duration of depression symptoms so that daily functioning is improved (OP Depression) LTG: Increase coping skills to manage depression and improve ability to perform daily activities (OP Depression) STG: [name] will identify cognitive patterns and beliefs that support depression (OP Depression) LTG: "Not feel hopeless or unworthy of anyone, and unable to do anything for anybody" (OP Depression) STG: "Improve my sleep habits" (OP Depression) LTG: "Reduce frequency of over thinking and increased worries" (Anxiety)  Progress Towards Goals: Initial  Interventions: CBT, Motivational Interviewing, Solution Focused, and Supportive  Summary: [name] is a 48 y.o. female with past psych history of Depression, recurrent and GAD, presenting for follow-up therapy session in efforts to improve management of depressive and anxious symptoms.   Patient actively engaged in session, presenting in pleasant moods overall with flat affect.  Patient actively engaged in check-in, detailing recent events and  factors contributing to presenting moods.  Actively engaged in reassessing presenting depressive and anxious symptoms experienced over the past 2 weeks via PHQ-9 and GAD-7, noting of reduction in depressive symptoms and minimal reductions in anxious symptoms, identifying improvements in sleep over recent weeks.  Actively engaged in exploring areas of concern related to symptoms and challenges to aid in the development of individualized goals to be reflected within tx plan.patient shared of having being working on managing anx a depressive sxs, by keeping busy with work, finding this has been helping prevent abilities to feel down and over think, as well as going to park with grandson 3x/week after picking him up from daycare.  Patient shared feeling positive towards going to dinner with wife for upcoming birthday, have been spending time together and communicating more.  Patient responded well to interventions. Patient continues to meet criteria for Depression, recurrent, and GAD. Patient will continue to benefit from engagement in outpatient therapy due to being the least restrictive service to meet presenting needs.      12/25/2023    1:18 PM 12/14/2023    8:17 AM 10/16/2023    2:32 PM 05/11/2023    9:10 AM  GAD 7 : Generalized Anxiety Score  Nervous, Anxious, on Edge 0 1 1 1   Control/stop worrying 1 1 1 1   Worry too much - different things 1 1 1 1   Trouble relaxing 1 1 1 1   Restless 1 1 1 1   Easily annoyed or irritable 1 1 1 1   Afraid - awful might happen 1 1 1 1   Total GAD 7 Score 6 7 7 7   Anxiety Difficulty Somewhat difficult Somewhat difficult  Somewhat difficult      12/25/2023    1:21 PM 12/14/2023    8:19 AM 10/16/2023  2:31 PM 09/24/2023    2:24 PM 05/11/2023    9:09 AM  Depression screen PHQ 2/9  Decreased Interest 1 1 1  0 0  Down, Depressed, Hopeless 1 1 1 1  0  PHQ - 2 Score 2 2 2 1  0  Altered sleeping 1 2 1   0  Tired, decreased energy 1 2 1  1   Change in appetite 1 1 1  1    Feeling bad or failure about yourself  1 1 0  1  Trouble concentrating 1 1 1   0  Moving slowly or fidgety/restless 0 1 1  0  Suicidal thoughts 0 0 0  0  PHQ-9 Score 7 10 7  3   Difficult doing work/chores Somewhat difficult Somewhat difficult   Somewhat difficult   Flowsheet Row Counselor from 12/14/2023 in [facility] from 02/17/2021 in [facility] Counselor from 01/06/2021 in [facility]  C-SSRS RISK CATEGORY High Risk Error: Question 1 not populated No Risk        Suicidal/Homicidal: Nowithout intent/plan  Therapist Response: Clinician utilized CBT, MI, solution focused, and supportive reflection interventions to address presenting stressors and sxs. Clinician actively engaged pt in check-in, assessing mood and affect.  Clinician actively engage patient upon connection for virtual visits, engaging in introductory check-in, assessing presenting moods and affect, and further eliciting patient's recounts and reflections of daily events and recent events contributing to presenting moods. Actively engaged pt in re-administering of PHQ-9 and GAD-7, and exploration of variances in reported sxs.  Engage patient in processing thoughts and feelings surrounding presenting concerns, challenges, and symptoms in order to aid patient in identifying appropriate goals to be outlined within individualized treatment plan.  Utilized Socratic questioning to support patient further critical thinking of identified concerns and areas for work.  Clinician reassessed severity of presenting sxs, and presence of any safety concerns. Clinician provided support and empathy to patient during session.  Plan: Return again in 1 weeks.  Diagnosis:  Encounter Diagnoses  Name Primary?   Depression, recurrent (HCC) Yes   GAD (generalized anxiety disorder)     Collaboration of Care: Psychiatrist AEB provider  documentation available in EHR.  Patient/Guardian was advised Release of Information must be obtained prior to any record release in order to collaborate their care with an outside provider. Patient/Guardian was advised if they have not already done so to contact the registration department to sign all necessary forms in order for us  to release information regarding their care.   Consent: Patient/Guardian gives verbal consent for treatment and assignment of benefits for services provided during this visit. Patient/Guardian expressed understanding and agreed to proceed.   [name], MSW, LCSW 12/25/2023,  1:30 PM

## 2023-12-28 ENCOUNTER — Ambulatory Visit (HOSPITAL_COMMUNITY): Admitting: Licensed Clinical Social Worker

## 2023-12-31 ENCOUNTER — Ambulatory Visit (HOSPITAL_COMMUNITY): Admitting: Licensed Clinical Social Worker

## 2024-01-08 ENCOUNTER — Ambulatory Visit (INDEPENDENT_AMBULATORY_CARE_PROVIDER_SITE_OTHER): Admitting: Licensed Clinical Social Worker

## 2024-01-08 DIAGNOSIS — F411 Generalized anxiety disorder: Secondary | ICD-10-CM | POA: Diagnosis not present

## 2024-01-08 DIAGNOSIS — F339 Major depressive disorder, recurrent, unspecified: Secondary | ICD-10-CM

## 2024-01-08 NOTE — Progress Notes (Unsigned)
 THERAPIST PROGRESS NOTE   Session Date: 01/08/2024  Session Time: 1510- 1559 Virtual Visit via Video Note  I connected with [name] on 01/08/24 at  3:10 PM EDT by a video enabled telemedicine application and verified that I am speaking with the correct person using two identifiers.  Location: Patient: Home Provider: Home Office   I discussed the limitations of evaluation and management by telemedicine and the availability of in person appointments. The patient expressed understanding and agreed to proceed.  The patient was advised to call back or seek an in-person evaluation if the symptoms worsen or if the condition fails to improve as anticipated.  I provided 48 minutes of non-face-to-face time during this encounter.  Participation Level: Active  Behavioral Response: CasualAlertDysphoric  Type of Therapy: Individual Therapy  Treatment Goals addressed:  LTG: Reduce frequency, intensity, and duration of depression symptoms so that daily functioning is improved (OP Depression) LTG: Increase coping skills to manage depression and improve ability to perform daily activities (OP Depression) STG: Kevina will identify cognitive patterns and beliefs that support depression (OP Depression) LTG: "Not feel hopeless or unworthy of anyone, and unable to do anything for anybody" (OP Depression) STG: "Improve my sleep habits" (OP Depression) LTG: "Reduce frequency of over thinking and increased worries" (Anxiety)  Progress Towards Goals: Progressing  Interventions: CBT, Motivational Interviewing, Solution Focused, and Supportive  Summary: [name] is a 48 y.o. female with past psych history of Depression, recurrent and GAD, presenting for follow-up therapy session in efforts to improve management of depressive and anxious symptoms.   Patient actively engaged in session, presenting in pleasant moods overall with flat affect.  Patient actively engaged in check-in, sharing of having been  doing well, with recent birthday passing last week, having gone to dinner with wife, mostly relaxed week, currently searching for new housing and planning on going to view two apartments tomorrow to determine which she prefers. ***           Patient responded well to interventions. Patient continues to meet criteria for Depression, recurrent, and GAD. Patient will continue to benefit from engagement in outpatient therapy due to being the least restrictive service to meet presenting needs.      12/25/2023    1:18 PM 12/14/2023    8:17 AM 10/16/2023    2:32 PM 05/11/2023    9:10 AM  GAD 7 : Generalized Anxiety Score  Nervous, Anxious, on Edge 0 1 1 1   Control/stop worrying 1 1 1 1   Worry too much - different things 1 1 1 1   Trouble relaxing 1 1 1 1   Restless 1 1 1 1   Easily annoyed or irritable 1 1 1 1   Afraid - awful might happen 1 1 1 1   Total GAD 7 Score 6 7 7 7   Anxiety Difficulty Somewhat difficult Somewhat difficult  Somewhat difficult      12/25/2023    1:21 PM 12/14/2023    8:19 AM 10/16/2023    2:31 PM 09/24/2023    2:24 PM 05/11/2023    9:09 AM  Depression screen PHQ 2/9  Decreased Interest 1 1 1  0 0  Down, Depressed, Hopeless 1 1 1 1  0  PHQ - 2 Score 2 2 2 1  0  Altered sleeping 1 2 1   0  Tired, decreased energy 1 2 1  1   Change in appetite 1 1 1  1   Feeling bad or failure about yourself  1 1 0  1  Trouble concentrating 1  1 1  0  Moving slowly or fidgety/restless 0 1 1  0  Suicidal thoughts 0 0 0  0  PHQ-9 Score 7 10 7  3   Difficult doing work/chores Somewhat difficult Somewhat difficult   Somewhat difficult   Flowsheet Row Counselor from 12/14/2023 in [facility] at [facility] from 02/17/2021 in [facility] Counselor from 01/06/2021 in [facility]  C-SSRS RISK CATEGORY High Risk Error: Question 1 not populated No Risk        Suicidal/Homicidal: Nowithout  intent/plan  Therapist Response: Clinician utilized CBT, MI, solution focused, and supportive reflection interventions to address presenting stressors and sxs. Clinician actively engaged pt in check-in, assessing mood and affect.  Clinician ***   actively engage patient upon connection for virtual visits, engaging in introductory check-in, assessing presenting moods and affect, and further eliciting patient's recounts and reflections of daily events and recent events contributing to presenting moods. Actively engaged pt in re-administering of PHQ-9 and GAD-7, and exploration of variances in reported sxs.  Engage patient in processing thoughts and feelings surrounding presenting concerns, challenges, and symptoms in order to aid patient in identifying appropriate goals to be outlined within individualized treatment plan.  Utilized Socratic questioning to support patient further critical thinking of identified concerns and areas for work.  Clinician reassessed severity of presenting sxs, and presence of any safety concerns. Clinician provided support and empathy to patient during session.  Plan: Return again in 1 weeks.  Diagnosis:  Encounter Diagnoses  Name Primary?   Depression, recurrent (HCC) Yes   GAD (generalized anxiety disorder)    Collaboration of Care: Psychiatrist AEB provider documentation available in EHR.  Patient/Guardian was advised Release of Information must be obtained prior to any record release in order to collaborate their care with an outside provider. Patient/Guardian was advised if they have not already done so to contact the registration department to sign all necessary forms in order for us  to release information regarding their care.   Consent: Patient/Guardian gives verbal consent for treatment and assignment of benefits for services provided during this visit. Patient/Guardian expressed understanding and agreed to proceed.   [name], MSW, LCSW 01/08/2024,   3:11 PM

## 2024-01-10 ENCOUNTER — Ambulatory Visit (HOSPITAL_COMMUNITY): Admitting: Licensed Clinical Social Worker

## 2024-01-11 ENCOUNTER — Ambulatory Visit (HOSPITAL_COMMUNITY): Admitting: Licensed Clinical Social Worker

## 2024-01-15 ENCOUNTER — Ambulatory Visit (INDEPENDENT_AMBULATORY_CARE_PROVIDER_SITE_OTHER): Admitting: Licensed Clinical Social Worker

## 2024-01-15 DIAGNOSIS — F339 Major depressive disorder, recurrent, unspecified: Secondary | ICD-10-CM | POA: Diagnosis not present

## 2024-01-15 DIAGNOSIS — F411 Generalized anxiety disorder: Secondary | ICD-10-CM | POA: Diagnosis not present

## 2024-01-15 NOTE — Progress Notes (Signed)
 THERAPIST PROGRESS NOTE   Session Date: 01/15/2024  Session Time: 1305 - 1352 Virtual Visit via Video Note  I connected with [name] on 01/15/24 at  1:00 PM EDT by a video enabled telemedicine application and verified that I am speaking with the correct person using two identifiers.  Location: Patient: Home Provider: Home Office   I discussed the limitations of evaluation and management by telemedicine and the availability of in person appointments. The patient expressed understanding and agreed to proceed.  The patient was advised to call back or seek an in-person evaluation if the symptoms worsen or if the condition fails to improve as anticipated.  I provided 46 minutes of non-face-to-face time during this encounter.  Participation Level: Active  Behavioral Response: CasualAlertDepressed and tearfulness  Type of Therapy: Individual Therapy  Treatment Goals addressed:  LTG: Reduce frequency, intensity, and duration of depression symptoms so that daily functioning is improved (OP Depression) LTG: Increase coping skills to manage depression and improve ability to perform daily activities (OP Depression) STG: Jace will identify cognitive patterns and beliefs that support depression (OP Depression) LTG: "Not feel hopeless or unworthy of anyone, and unable to do anything for anybody" (OP Depression) STG: "Improve my sleep habits" (OP Depression) LTG: "Reduce frequency of over thinking and increased worries" (Anxiety)  Progress Towards Goals: Progressing  Interventions: CBT, Motivational Interviewing, Solution Focused, and Supportive  Summary: [name] is a 48 y.o. female with past psych history of Depression, recurrent and GAD, presenting for follow-up therapy session in efforts to improve management of depressive and anxious symptoms.   Patient actively engaged in session, presenting in pleasant moods and congruent affect throughout duration of visit. Patient actively  engaged in check-in, sharing of day going well, having a busy morning at work, expressing excitement for upcoming trip at the end of the week for late birthday celebration, further sharing of prior trips and excitement to be visiting new area. Reassessed presenting depressive and anxious sxs via PHQ-9 and GAD-7, processing continued downward trend in observed/experienced sxs over the past two weeks. Revisited prior session discussion surrounding desires to move residence, having found apartments to be too small and depressing, finding self deciding to remain at current home. Processed feelings surrounding any attachment to current residence, denying attachment due to challenging experiences having occurred in the home with ex-wife, but not wanting to compromise on presenting needs and desires for a new residence. Further processed stress surrounding separation and pursuance of divorce, processing pt's perspectives of outlook of relationship.   Patient responded well to interventions. Patient continues to meet criteria for Depression, recurrent, and GAD. Patient will continue to benefit from engagement in outpatient therapy due to being the least restrictive service to meet presenting needs.      01/15/2024    1:11 PM 12/25/2023    1:18 PM 12/14/2023    8:17 AM 10/16/2023    2:32 PM  GAD 7 : Generalized Anxiety Score  Nervous, Anxious, on Edge 1 0 1 1  Control/stop worrying 1 1 1 1   Worry too much - different things 1 1 1 1   Trouble relaxing 1 1 1 1   Restless 1 1 1 1   Easily annoyed or irritable 1 1 1 1   Afraid - awful might happen 0 1 1 1   Total GAD 7 Score 6 6 7 7   Anxiety Difficulty Somewhat difficult Somewhat difficult Somewhat difficult       01/15/2024    1:14 PM 12/25/2023    1:21 PM  12/14/2023    8:19 AM 10/16/2023    2:31 PM 09/24/2023    2:24 PM  Depression screen PHQ 2/9  Decreased Interest 0 1 1 1  0  Down, Depressed, Hopeless 1 1 1 1 1   PHQ - 2 Score 1 2 2 2 1   Altered sleeping 0 1  2 1    Tired, decreased energy 0 1 2 1    Change in appetite 1 1 1 1    Feeling bad or failure about yourself  0 1 1 0   Trouble concentrating 1 1 1 1    Moving slowly or fidgety/restless 1 0 1 1   Suicidal thoughts 0 0 0 0   PHQ-9 Score 4 7 10 7    Difficult doing work/chores Somewhat difficult Somewhat difficult Somewhat difficult     Flowsheet Row Counselor from 12/14/2023 in [facility] at [facility] from 02/17/2021 in [facility] Counselor from 01/06/2021 in [facility]  C-SSRS RISK CATEGORY High Risk Error: Question 1 not populated No Risk        Suicidal/Homicidal: Nowithout intent/plan  Therapist Response: Clinician utilized CBT, MI, solution focused, and supportive reflection interventions to address presenting stressors and sxs. Clinician actively engaged pt in check-in, assessing mood and affect.  Clinician actively greeted patient upon joining virtual visit, assessing presenting moods and affect, actively engaging pt in introductory check-in, and further prompting patient's reflections of recent events and factors contributing to presenting moods. Actively listened to pt's recounts of morning at work, and past weeks events, as well as excitement surrounding upcoming vacation. Utilized open-ended questions to explore pt's thoughts and feelings surrounding recent events to include apartment search and interactions with ex-wife. Engaged pt in reassessing presenting depressive and anxious sxs, further processing downward trends in noted sxs, and observed impact on overall moods. Utilized socratic questioning to support pt in greater critical processing of presenting stressors and how these prove to be impacting pt's moods.  Clinician reassessed severity of presenting sxs, and presence of any safety concerns. Clinician provided support and empathy to patient during session.  Plan: Return again in  1 weeks.  Diagnosis:  Encounter Diagnoses  Name Primary?   Depression, recurrent (HCC) Yes   GAD (generalized anxiety disorder)     Collaboration of Care: Psychiatrist AEB provider documentation available in EHR.  Patient/Guardian was advised Release of Information must be obtained prior to any record release in order to collaborate their care with an outside provider. Patient/Guardian was advised if they have not already done so to contact the registration department to sign all necessary forms in order for us  to release information regarding their care.   Consent: Patient/Guardian gives verbal consent for treatment and assignment of benefits for services provided during this visit. Patient/Guardian expressed understanding and agreed to proceed.   [name], MSW, LCSW 01/15/2024,  1:17 PM

## 2024-01-24 ENCOUNTER — Ambulatory Visit (HOSPITAL_COMMUNITY): Admitting: Licensed Clinical Social Worker

## 2024-01-24 DIAGNOSIS — F411 Generalized anxiety disorder: Secondary | ICD-10-CM

## 2024-01-24 DIAGNOSIS — F339 Major depressive disorder, recurrent, unspecified: Secondary | ICD-10-CM

## 2024-01-24 NOTE — Progress Notes (Signed)
 THERAPIST PROGRESS NOTE   Session Date: 01/24/2024  Session Time: 1401 - 1448 Virtual Visit via Video Note  I connected with [name] on 01/24/24 at  2:00 PM EDT by a video enabled telemedicine application and verified that I am speaking with the correct person using two identifiers.  Location: Patient: Home Provider: Home Office   I discussed the limitations of evaluation and management by telemedicine and the availability of in person appointments. The patient expressed understanding and agreed to proceed.  The patient was advised to call back or seek an in-person evaluation if the symptoms worsen or if the condition fails to improve as anticipated.  I provided 46 minutes of non-face-to-face time during this encounter.  Participation Level: Active  Behavioral Response: CasualAlertDepressed and tearfulness  Type of Therapy: Individual Therapy  Treatment Goals addressed:  LTG: Reduce frequency, intensity, and duration of depression symptoms so that daily functioning is improved (OP Depression) LTG: Increase coping skills to manage depression and improve ability to perform daily activities (OP Depression) STG: [name] will identify cognitive patterns and beliefs that support depression (OP Depression) LTG: "Not feel hopeless or unworthy of anyone, and unable to do anything for anybody" (OP Depression) STG: "Improve my sleep habits" (OP Depression) LTG: "Reduce frequency of over thinking and increased worries" (Anxiety)  Progress Towards Goals: Progressing  Interventions: CBT, Motivational Interviewing, Solution Focused, and Supportive  Summary: [name] is a 48 y.o. female with past psych history of Depression, recurrent and GAD, presenting for follow-up therapy session in efforts to improve management of depressive and anxious symptoms.   Patient actively engaged in session, presenting in pleasant moods and congruent affect throughout duration of visit. Patient actively  engaged in check-in, sharing of recent weekend events and birthday trip to [place], having returned earlier this week, returning to work on Tuesday. Pt further shared of things being fine up until yesterday when pt and daughter received determination of pt's grandson receiving ASD dx. Processed feelings of relief after having received dx, feeling to now have greater awareness and understanding of presenting sxs and behaviors exhibited by grandson.  Further processed patient's, and daughters response to finding of grandson's diagnosis, exploring patient's perspective and awareness, and being proactive in aiming to ensure adequate supports of put in place.  Patient reported of having experienced minimal symptoms over the past week, sharing of having had a relaxed week overall outside of learning of grandson's diagnosis and plans to relax over coming days before returning to work on Sunday.  Patient responded well to interventions. Patient continues to meet criteria for Depression, recurrent, and GAD. Patient will continue to benefit from engagement in outpatient therapy due to being the least restrictive service to meet presenting needs.      01/15/2024    1:11 PM 12/25/2023    1:18 PM 12/14/2023    8:17 AM 10/16/2023    2:32 PM  GAD 7 : Generalized Anxiety Score  Nervous, Anxious, on Edge 1 0 1 1  Control/stop worrying 1 1 1 1   Worry too much - different things 1 1 1 1   Trouble relaxing 1 1 1 1   Restless 1 1 1 1   Easily annoyed or irritable 1 1 1 1   Afraid - awful might happen 0 1 1 1   Total GAD 7 Score 6 6 7 7   Anxiety Difficulty Somewhat difficult Somewhat difficult Somewhat difficult       01/15/2024    1:14 PM 12/25/2023    1:21 PM 12/14/2023  8:19 AM 10/16/2023    2:31 PM 09/24/2023    2:24 PM  Depression screen PHQ 2/9  Decreased Interest 0 1 1 1  0  Down, Depressed, Hopeless 1 1 1 1 1   PHQ - 2 Score 1 2 2 2 1   Altered sleeping 0 1 2 1    Tired, decreased energy 0 1 2 1    Change in  appetite 1 1 1 1    Feeling bad or failure about yourself  0 1 1 0   Trouble concentrating 1 1 1 1    Moving slowly or fidgety/restless 1 0 1 1   Suicidal thoughts 0 0 0 0   PHQ-9 Score 4 7 10 7    Difficult doing work/chores Somewhat difficult Somewhat difficult Somewhat difficult     Flowsheet Row Counselor from 12/14/2023 in [facility] at [facility] from 02/17/2021 in [facility] Counselor from 01/06/2021 in [facility]  C-SSRS RISK CATEGORY High Risk Error: Question 1 not populated No Risk        Suicidal/Homicidal: Nowithout intent/plan  Therapist Response: Clinician utilized CBT, MI, solution focused, and supportive reflection interventions to address presenting stressors and sxs. Clinician actively engaged pt in check-in, assessing mood and affect.  Clinician actively greeted patient upon joining virtual visit, assessing presenting moods and affect, engaging in introductory check-in, actively listened to patient's recounts of daily events, and occurrences throughout the past week.  Actively listened to patient's reflections of recent weekend vacation, utilizing open-ended questions to explore patient's awareness and observed improvements in moods and minimal presentation of symptoms.  Provided support and validation to patient and expressed feelings surrounding learning of grandson's diagnosis, utilizing Socratic questioning for further support patient and greater critical processing of her thoughts and feelings of such challenges, and exploring patient's perspective of navigating systems to ensure adequate supports be available for her grandson.  Clinician reassessed severity of presenting sxs, and presence of any safety concerns. Clinician provided support and empathy to patient during session.  Plan: Return again in 1 weeks.  Diagnosis:  Encounter Diagnoses  Name Primary?   Depression,  recurrent (HCC) Yes   GAD (generalized anxiety disorder)      Collaboration of Care: Psychiatrist AEB provider documentation available in EHR.  Patient/Guardian was advised Release of Information must be obtained prior to any record release in order to collaborate their care with an outside provider. Patient/Guardian was advised if they have not already done so to contact the registration department to sign all necessary forms in order for us  to release information regarding their care.   Consent: Patient/Guardian gives verbal consent for treatment and assignment of benefits for services provided during this visit. Patient/Guardian expressed understanding and agreed to proceed.   [name], MSW, LCSW 01/24/2024,  2:49 PM

## 2024-01-25 ENCOUNTER — Ambulatory Visit (HOSPITAL_COMMUNITY): Admitting: Licensed Clinical Social Worker

## 2024-01-28 ENCOUNTER — Telehealth: Payer: Self-pay | Admitting: Nurse Practitioner

## 2024-01-28 DIAGNOSIS — M816 Localized osteoporosis [Lequesne]: Secondary | ICD-10-CM

## 2024-01-28 NOTE — Telephone Encounter (Signed)
 Copied from CRM [id number]. Topic: Appointments - Scheduling Inquiry for Clinic >> Jan 28, 2024  1:46 PM [place] E wrote: Reason for CRM: Patient called needing to schedule her Prolia  injection. Stated she was due around May 5th for this injection. Callback number for patient is [phone].

## 2024-01-29 ENCOUNTER — Ambulatory Visit (INDEPENDENT_AMBULATORY_CARE_PROVIDER_SITE_OTHER): Admitting: Licensed Clinical Social Worker

## 2024-01-29 DIAGNOSIS — F411 Generalized anxiety disorder: Secondary | ICD-10-CM | POA: Diagnosis not present

## 2024-01-29 DIAGNOSIS — F339 Major depressive disorder, recurrent, unspecified: Secondary | ICD-10-CM

## 2024-01-29 NOTE — Progress Notes (Signed)
 THERAPIST PROGRESS NOTE   Session Date: 01/29/2024  Session Time: 1507 - 1552 Virtual Visit via Video Note  I connected with [name] on 01/29/24 at  3:00 PM EDT by a video enabled telemedicine application and verified that I am speaking with the correct person using two identifiers.  Location: Patient: Home Provider: Home Office   I discussed the limitations of evaluation and management by telemedicine and the availability of in person appointments. The patient expressed understanding and agreed to proceed.  The patient was advised to call back or seek an in-person evaluation if the symptoms worsen or if the condition fails to improve as anticipated.  I provided 44 minutes of non-face-to-face time during this encounter.  Participation Level: Active  Behavioral Response: CasualAlertDepressed and tearfulness  Type of Therapy: Individual Therapy  Treatment Goals addressed:  LTG: Reduce frequency, intensity, and duration of depression symptoms so that daily functioning is improved (OP Depression) LTG: Increase coping skills to manage depression and improve ability to perform daily activities (OP Depression) STG: Meda will identify cognitive patterns and beliefs that support depression (OP Depression) LTG: "Not feel hopeless or unworthy of anyone, and unable to do anything for anybody" (OP Depression) STG: "Improve my sleep habits" (OP Depression) LTG: "Reduce frequency of over thinking and increased worries" (Anxiety)  Progress Towards Goals: Progressing  Interventions: CBT, Motivational Interviewing, Solution Focused, and Supportive  Summary: [name] is a 48 y.o. female with past psych history of Depression, recurrent and GAD, presenting for follow-up therapy session in efforts to improve management of depressive and anxious symptoms.   Patient actively engaged in session, presenting in pleasant moods and congruent affect throughout duration of visit. Patient actively  engaged in check-in, sharing of doing well, having worked earlier this morning, as well as having worked yesterday and having been scheduled later this week. Further processed pt's stress surrounding current role and desires to secure alternate employment that proves to be more suitable for pt's needs and preferences. Actively engaged in exploration of correlation between thought patterns, perspectives, and moods, processing negative outlooks leading to negative, depressed moods, and the abilities to adjust perspectives to that of positive by actively seeking out positivity. Reassessed presenting depressive and anxious sxs via PHQ-9 and GAD-7, noting of continued reduction in sxs. Processed pt's reported challenges with sleep, identifying contributing factors to be that of thinking a lot mainly at night when by self, exploring benefits of journaling to note thoughts, empty mind, and identifying points of gratitude to support in relaxation in preparation for sleep.   Patient responded well to interventions. Patient continues to meet criteria for Depression, recurrent, and GAD. Patient will continue to benefit from engagement in outpatient therapy due to being the least restrictive service to meet presenting needs.      01/29/2024    3:39 PM 01/15/2024    1:11 PM 12/25/2023    1:18 PM 12/14/2023    8:17 AM  GAD 7 : Generalized Anxiety Score  Nervous, Anxious, on Edge 1 1 0 1  Control/stop worrying 1 1 1 1   Worry too much - different things 1 1 1 1   Trouble relaxing 0 1 1 1   Restless 0 1 1 1   Easily annoyed or irritable 1 1 1 1   Afraid - awful might happen 1 0 1 1  Total GAD 7 Score 5 6 6 7   Anxiety Difficulty Not difficult at all Somewhat difficult Somewhat difficult Somewhat difficult      01/29/2024    3:41  PM 01/15/2024    1:14 PM 12/25/2023    1:21 PM 12/14/2023    8:19 AM 10/16/2023    2:31 PM  Depression screen PHQ 2/9  Decreased Interest 0 0 1 1 1   Down, Depressed, Hopeless 0 1 1 1 1   PHQ -  2 Score 0 1 2 2 2   Altered sleeping 1 0 1 2 1   Tired, decreased energy 0 0 1 2 1   Change in appetite 0 1 1 1 1   Feeling bad or failure about yourself  1 0 1 1 0  Trouble concentrating 1 1 1 1 1   Moving slowly or fidgety/restless 0 1 0 1 1  Suicidal thoughts 0 0 0 0 0  PHQ-9 Score 3 4 7 10 7   Difficult doing work/chores Not difficult at all Somewhat difficult Somewhat difficult Somewhat difficult    Flowsheet Row Counselor from 12/14/2023 in [facility] at [facility] from 02/17/2021 in [facility] Counselor from 01/06/2021 in [facility]  C-SSRS RISK CATEGORY High Risk Error: Question 1 not populated No Risk        Suicidal/Homicidal: Nowithout intent/plan  Therapist Response: Clinician utilized CBT, MI, solution focused, and supportive reflection interventions to address presenting stressors and sxs. Clinician actively engaged pt in check-in, assessing mood and affect.  Clinician actively greeted patient upon joining virtual visit, openly engaging in introductory check-in, assessing presenting moods and affect, and prompting pt's recounts of daily and weekly events since previous visit, and factors contributing to presenting moods. Actively listened to patient's recounts of recent weekend events, and having returned to work since vacation. Further elicited pt's thoughts and feelings surrounding current workplace and alignment with pt's individual needs and preferences, providing support and validation of pt's express thoughts and shared recent efforts of securing alternate employment. Engaged in reassessing of presenting depressive and anxious sxs via PHQ-9 and GAD-7, sharing reductions in reported sxs, and processing various noted sxs proving challenging. Explored benefits of journaling with gathering thoughts to aid in relaxation and improvements of sleep.  Clinician reassessed severity of  presenting sxs, and presence of any safety concerns. Clinician provided support and empathy to patient during session.  Plan: Return again in 1 weeks.  Diagnosis:  Encounter Diagnoses  Name Primary?   Depression, recurrent (HCC) Yes   GAD (generalized anxiety disorder)       Collaboration of Care: Psychiatrist AEB provider documentation available in EHR.  Patient/Guardian was advised Release of Information must be obtained prior to any record release in order to collaborate their care with an outside provider. Patient/Guardian was advised if they have not already done so to contact the registration department to sign all necessary forms in order for us  to release information regarding their care.   Consent: Patient/Guardian gives verbal consent for treatment and assignment of benefits for services provided during this visit. Patient/Guardian expressed understanding and agreed to proceed.   [name], MSW, LCSW 01/29/2024,  3:42 PM

## 2024-01-30 ENCOUNTER — Other Ambulatory Visit: Payer: Self-pay

## 2024-01-30 DIAGNOSIS — M816 Localized osteoporosis [Lequesne]: Secondary | ICD-10-CM

## 2024-01-30 NOTE — Telephone Encounter (Signed)
 Referral was placed on 01/30/2024 and contacted Norma Beckers Persons, PA-C; left VM for call back. [phone]).

## 2024-01-31 NOTE — Telephone Encounter (Signed)
 Called patient and informed her Prolia  injections will be given by the osteoporosis clinic and that a referral has been placed and someone from their office will be giving her a call. She thanked me for getting back with her and will be looking for the call from the other office for her injection.

## 2024-02-05 ENCOUNTER — Ambulatory Visit (HOSPITAL_COMMUNITY): Admitting: Licensed Clinical Social Worker

## 2024-02-05 ENCOUNTER — Ambulatory Visit

## 2024-02-05 DIAGNOSIS — Z111 Encounter for screening for respiratory tuberculosis: Secondary | ICD-10-CM

## 2024-02-05 NOTE — Progress Notes (Signed)
 PPD Placement note [name], 48 y.o. female is here today for placement of PPD test left forarm Reason for PPD test: screening  Pt taken PPD test before: unknown Verified in allergy area and with patient that they are not allergic to the products PPD is made of (Phenol or Tween). Yes Is patient taking any oral or IV steroid medication now or have they taken it in the la P:  PPD placed on 02/05/2024.  Patient advised to return for reading within 48-72 hours.

## 2024-02-07 ENCOUNTER — Ambulatory Visit (INDEPENDENT_AMBULATORY_CARE_PROVIDER_SITE_OTHER)

## 2024-02-07 ENCOUNTER — Ambulatory Visit (HOSPITAL_COMMUNITY): Admitting: Licensed Clinical Social Worker

## 2024-02-07 DIAGNOSIS — Z111 Encounter for screening for respiratory tuberculosis: Secondary | ICD-10-CM | POA: Diagnosis not present

## 2024-02-07 DIAGNOSIS — F339 Major depressive disorder, recurrent, unspecified: Secondary | ICD-10-CM

## 2024-02-07 DIAGNOSIS — F411 Generalized anxiety disorder: Secondary | ICD-10-CM | POA: Diagnosis not present

## 2024-02-07 NOTE — Progress Notes (Signed)
 THERAPIST PROGRESS NOTE   Session Date: 02/07/2024  Session Time: 1508 - 1605 Virtual Visit via Video Note  I connected with [name] on 02/07/24 at  3:00 PM EDT by a video enabled telemedicine application and verified that I am speaking with the correct person using two identifiers.  Location: Patient: Home Provider: Home Office   I discussed the limitations of evaluation and management by telemedicine and the availability of in person appointments. The patient expressed understanding and agreed to proceed.  The patient was advised to call back or seek an in-person evaluation if the symptoms worsen or if the condition fails to improve as anticipated.  I provided 57 minutes of non-face-to-face time during this encounter.  Participation Level: Active  Behavioral Response: CasualAlertEuthymic  Type of Therapy: Individual Therapy  Treatment Goals addressed:  LTG: Reduce frequency, intensity, and duration of depression symptoms so that daily functioning is improved (OP Depression) LTG: Increase coping skills to manage depression and improve ability to perform daily activities (OP Depression) STG: [name] will identify cognitive patterns and beliefs that support depression (OP Depression) LTG: Not feel hopeless or unworthy of anyone, and unable to do anything for anybody (OP Depression) STG: Improve my sleep habits (OP Depression) LTG: Reduce frequency of over thinking and increased worries (Anxiety)  Progress Towards Goals: Progressing  Interventions: CBT, Motivational Interviewing, Solution Focused, and Supportive  Summary: [name] is a 48 y.o. female with past psych history of MDD, recurrent and GAD, presenting for follow-up therapy session in efforts to improve management of depressive and anxious symptoms.   Patient actively engaged in session, presenting in pleasant moods and congruent affect throughout duration of visit. 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 Revisited pt's individual perspective surrounding separation and potential divorce, sharing of having not experienced any depression and anxiety surrounding situation and finding acceptance and peace in inability to fix or force resolution, sharing of finding enjoyment in life.  Patient engaged in further reflection of presenting needs of mother, family history, parental history, own childhood history, history of early parenthood, history of relationship with the daughter, and various challenges experienced throughout such relationships.  Patient responded well to interventions. Patient continues to meet criteria for MDD, recurrent, and GAD. Patient will continue to benefit from engagement in outpatient therapy due to being the least restrictive service to meet presenting needs.      01/29/2024    3:39 PM 01/15/2024    1:11 PM 12/25/2023    1:18 PM 12/14/2023    8:17 AM  GAD 7 : Generalized Anxiety Score  Nervous, Anxious, on Edge 1 1 0 1  Control/stop worrying 1 1 1 1   Worry too much - different things 1 1 1 1   Trouble relaxing 0 1 1 1   Restless 0 1 1 1   Easily annoyed or irritable 1 1 1 1   Afraid - awful might happen 1 0 1 1  Total GAD 7 Score 5 6 6 7   Anxiety Difficulty Not difficult at all Somewhat difficult Somewhat difficult Somewhat difficult      01/29/2024    3:41 PM 01/15/2024  1:14 PM 12/25/2023    1:21 PM 12/14/2023    8:19 AM 10/16/2023    2:31 PM  Depression screen PHQ 2/9  Decreased Interest 0 0 1 1 1   Down, Depressed, Hopeless 0 1 1 1  1   PHQ - 2 Score 0 1 2 2 2   Altered sleeping 1 0 1 2 1   Tired, decreased energy 0 0 1 2 1   Change in appetite 0 1 1 1 1   Feeling bad or failure about yourself  1 0 1 1 0  Trouble concentrating 1 1 1 1 1   Moving slowly or fidgety/restless 0 1 0 1 1  Suicidal thoughts 0 0 0 0 0  PHQ-9 Score 3 4 7 10 7   Difficult doing work/chores Not difficult at all Somewhat difficult Somewhat difficult Somewhat difficult    Flowsheet Row Counselor from 12/14/2023 in [facility] at [facility] from 02/17/2021 in [facility] Counselor from 01/06/2021 in [facility]  C-SSRS RISK CATEGORY High Risk Error: Question 1 not populated No Risk     Suicidal/Homicidal: Nowithout intent/plan  Therapist Response: Clinician utilized CBT, MI, solution focused, and supportive reflection interventions to address presenting stressors and sxs. Clinician actively engaged pt in check-in, assessing mood and affect.  Clinician actively greeted patient upon joining virtual visit, assessing presenting moods and affect, openly engaging in introductory check-in, eliciting pt's brief recounts of daily and weekly events, and factors contributing to presenting moods.  Actively listened to patient's recounts of past week, utilizing open-ended questions to support patient in identifying of thoughts, feelings, and perspectives surrounding progressions of the past week.  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  Further supported patient and actively listening to recounts of individual and family history, and processing impact and/or  implications on various relationships.  Clinician reassessed severity of presenting sxs, and presence of any safety concerns. Clinician provided support and empathy to patient during session.  Plan: Return again in 1 weeks.  Diagnosis:  Encounter Diagnoses  Name Primary?   Depression, recurrent (HCC) Yes   GAD (generalized anxiety disorder)     Collaboration of Care: Psychiatrist AEB provider documentation available in EHR.  Patient/Guardian was advised Release of Information must be obtained prior to any record release in order to collaborate their care with an outside provider. Patient/Guardian was advised if they have not already done so to contact the registration department to sign all necessary forms in order for us  to release information regarding their care.   Consent: Patient/Guardian gives verbal consent for treatment and assignment of benefits for services provided during this visit. Patient/Guardian expressed understanding and agreed to proceed.   [name], MSW, LCSW 02/07/2024,  3:10 PM

## 2024-02-07 NOTE — Progress Notes (Signed)
 PPD Reading Note  PPD read and results entered in EpicCare.  Result: 0 mm induration.  Interpretation: Negative  If test not read within 48-72 hours of initial placement, patient advised to repeat in other arm 1-3 weeks after this test.  Allergic reaction: no

## 2024-02-08 ENCOUNTER — Ambulatory Visit (HOSPITAL_COMMUNITY): Admitting: Licensed Clinical Social Worker

## 2024-02-13 ENCOUNTER — Telehealth: Payer: Self-pay

## 2024-02-13 DIAGNOSIS — M816 Localized osteoporosis [Lequesne]: Secondary | ICD-10-CM

## 2024-02-13 NOTE — Telephone Encounter (Signed)
 Prolia VOB initiated via [email or web address]  Next Prolia inj DUE: NOW

## 2024-02-14 ENCOUNTER — Other Ambulatory Visit (HOSPITAL_COMMUNITY): Payer: Self-pay

## 2024-02-14 ENCOUNTER — Ambulatory Visit: Admitting: Physician Assistant

## 2024-02-14 NOTE — Telephone Encounter (Signed)
 [name]

## 2024-02-14 NOTE — Telephone Encounter (Signed)
 Pt ready for scheduling for PROLIA  on or after : 02/14/24  Option# 1: Buy/Bill (Office supplied medication)  Out-of-pocket cost due at time of clinic visit: $357  Number of injection/visits approved: 2  Primary: [employer]-MEDICARE Prolia  co-insurance: 20% Admin fee co-insurance: 20%  Secondary: -- Prolia  co-insurance:  Admin fee co-insurance:   Medical Benefit Details: Date Benefits were checked: 02/14/24 Deductible: $257 Met of $257 Required/ Coinsurance: 20%/ Admin Fee: 20%  Prior Auth: APPROVED PA# [id number] Expiration Date: 02/14/24-02/13/25  # of doses approved: 2 ----------------------------------------------------------------------- Option# 2- Med Obtained from pharmacy:  Pharmacy benefit: Copay $0 (Paid to pharmacy) Admin Fee: 20% (Pay at clinic)  Prior Auth: N/A PA# Expiration Date:   # of doses approved:   If patient wants fill through the pharmacy benefit please send prescription to: [facility], and include estimated need by date in rx notes. Pharmacy will ship medication directly to the office.  Patient NOT eligible for Prolia  Copay Card. Copay Card can make patient's cost as little as $25. Link to apply: [email or web address]://[email or web address]  ** This summary of benefits is an estimation of the patient's out-of-pocket cost. Exact cost may very based on individual plan coverage.

## 2024-02-19 ENCOUNTER — Encounter (HOSPITAL_COMMUNITY): Payer: Self-pay

## 2024-02-19 ENCOUNTER — Ambulatory Visit (INDEPENDENT_AMBULATORY_CARE_PROVIDER_SITE_OTHER): Admitting: Licensed Clinical Social Worker

## 2024-02-19 DIAGNOSIS — Z91199 Patient's noncompliance with other medical treatment and regimen due to unspecified reason: Secondary | ICD-10-CM

## 2024-02-19 NOTE — Progress Notes (Signed)
 THERAPIST PROGRESS NOTE   Session Date: 02/19/2024  Session Time: 1400  Pt contacted office at 1312 detailing inability to attend today's scheduled appt due to having been unable to get off work in time.  Missed appt will be considered no-show.  Lynwood JONETTA Maris, MSW, LCSW 02/19/2024,  3:03 PM

## 2024-02-19 NOTE — Telephone Encounter (Signed)
 [facility] message sent to pt with the cost of medication.

## 2024-02-20 ENCOUNTER — Encounter (HOSPITAL_COMMUNITY): Payer: Self-pay

## 2024-02-20 ENCOUNTER — Emergency Department (HOSPITAL_COMMUNITY)
Admission: EM | Admit: 2024-02-20 | Discharge: 2024-02-20 | Disposition: A | Discharge status: Home / Self Care | Attending: Emergency Medicine | Admitting: Emergency Medicine

## 2024-02-20 ENCOUNTER — Emergency Department (HOSPITAL_COMMUNITY)

## 2024-02-20 ENCOUNTER — Other Ambulatory Visit: Payer: Self-pay

## 2024-02-20 DIAGNOSIS — M545 Low back pain, unspecified: Secondary | ICD-10-CM | POA: Insufficient documentation

## 2024-02-20 DIAGNOSIS — W010XXA Fall on same level from slipping, tripping and stumbling without subsequent striking against object, initial encounter: Secondary | ICD-10-CM | POA: Insufficient documentation

## 2024-02-20 DIAGNOSIS — Y990 Civilian activity done for income or pay: Secondary | ICD-10-CM | POA: Insufficient documentation

## 2024-02-20 DIAGNOSIS — M533 Sacrococcygeal disorders, not elsewhere classified: Secondary | ICD-10-CM | POA: Diagnosis not present

## 2024-02-20 DIAGNOSIS — M5459 Other low back pain: Secondary | ICD-10-CM | POA: Diagnosis not present

## 2024-02-20 DIAGNOSIS — M47817 Spondylosis without myelopathy or radiculopathy, lumbosacral region: Secondary | ICD-10-CM | POA: Diagnosis not present

## 2024-02-20 DIAGNOSIS — W19XXXA Unspecified fall, initial encounter: Secondary | ICD-10-CM

## 2024-02-20 MED ORDER — METHOCARBAMOL 750 MG PO TABS: 750.0000 mg | ORAL_TABLET | Freq: Four times a day (QID) | ORAL | 0 refills | Status: AC

## 2024-02-20 MED ORDER — LIDOCAINE 5 % EX PTCH
2.0000 | MEDICATED_PATCH | CUTANEOUS | Status: DC
  Administered 2024-02-20: 2 via TRANSDERMAL
  Filled 2024-02-20: qty 2

## 2024-02-20 MED ORDER — KETOROLAC TROMETHAMINE 15 MG/ML IJ SOLN
15.0000 mg | Freq: Once | INTRAMUSCULAR | Status: AC
  Administered 2024-02-20: 15 mg via INTRAMUSCULAR
  Filled 2024-02-20: qty 1

## 2024-02-20 MED ORDER — LIDOCAINE 5 % EX PTCH: 1.0000 | MEDICATED_PATCH | CUTANEOUS | 0 refills | Status: DC

## 2024-02-20 NOTE — Discharge Instructions (Addendum)
 Today you are seen for back pain after a fall.  Please alternate taking Tylenol  and Motrin  as needed for pain, rest, and ice the affected areas.  You may also use the muscle relaxers and Lidoderm patches prescribed to you.  Thank you for letting us  treat you today.  Please return to the ED if you have paralysis, loss of bowel or bladder control, or uncontrollable vomiting.  After reviewing your imaging, I feel you are safe to go home. Please follow up with your PCP in the next several days and provide them with your records from this visit. Return to the Emergency Room if pain becomes severe or symptoms worsen.

## 2024-02-20 NOTE — ED Triage Notes (Signed)
 Pt arrives via POV. Pt states she slipped and fell on the floor this morning at work. Pt c/o pain to lumbar and sacral area. Pt is AxOx4. Denies hitting her head, no loc.

## 2024-02-20 NOTE — ED Provider Notes (Signed)
 [place] EMERGENCY DEPARTMENT AT [facility] Provider Note   CSN: [id number] Arrival date & time: 02/20/24  1132     Patient presents with: Fall   [name] is a 48 y.o. female presents today after a slip and fall at work.  Patient patient complaining of lumbar and sacral pain.  Patient denies head injury, LOC, nausea, vomiting, numbness, weakness, loss of bowel or bladder control, saddle anesthesia, fever, chills, any other complaints at this time.    Fall       Prior to Admission medications   Medication Sig Start Date End Date Taking? Authorizing Provider  lidocaine (LIDODERM) 5 % Place 1 patch onto the skin daily. Remove & Discard patch within 12 hours or as directed by MD 02/20/24  Yes Kaylina Cahue N, PA-C  methocarbamol (ROBAXIN) 750 MG tablet Take 1 tablet (750 mg total) by mouth 4 (four) times daily for 5 days. 02/20/24 02/25/24 Yes Novah Nessel N, PA-C  amLODipine  (NORVASC ) 5 MG tablet Take 1 tablet (5 mg total) by mouth at bedtime. 11/21/23   Nche, Roselie Rockford, NP  Cholecalciferol (VITAMIN D3) 25 MCG (1000 UT) CAPS Take by mouth.    [provider]  cromolyn (OPTICROM) 4 % ophthalmic solution Place 1 drop into both eyes as needed.    [provider]  cycloSPORINE (RESTASIS) 0.05 % ophthalmic emulsion 1 drop 2 (two) times daily.    [provider]  denosumab  (PROLIA ) 60 MG/ML SOSY injection Inject 60 mg into the skin every 6 (six) months. 06/08/23   Nche, Roselie Rockford, NP  doxepin  (SINEQUAN ) 50 MG capsule Take 1 capsule (50 mg total) by mouth at bedtime. 11/19/23 11/18/24  Ezzard Staci SAILOR, NP  HYDROcodone -acetaminophen  (NORCO/VICODIN) 5-325 MG tablet Take 1 tablet by mouth every 6 (six) hours as needed. 05/10/20   Melonie Colonel, Mikel HERO, MD  ibuprofen  (ADVIL ) 600 MG tablet TAKE 1 TABLET (600 MG TOTAL) BY MOUTH EVERY 12 (TWELVE) HOURS AS NEEDED (WITH FOOD). 05/14/23   Nche, Roselie Rockford, NP  Multiple Vitamins-Minerals (ONE-A-DAY  VITACRAVES ADULT) CHEW Chew 1 each by mouth daily.    [provider]  predniSONE  (DELTASONE ) 2.5 MG tablet Take 2.5 mg by mouth daily. 07/28/21   [provider]  riTUXimab  (RITUXAN ) 500 MG/50ML injection Inject 1,000 mg into the vein every 6 (six) months.     [provider]  valACYclovir  (VALTREX ) 500 MG tablet Take 500 mg by mouth as needed (outbreak). Take 1 tablet as needed for onset out breaks    [provider]    Allergies: Buspirone, Lisinopril , and Paxil  [paroxetine hcl]    Review of Systems  Musculoskeletal:  Positive for back pain.    Updated Vital Signs BP (!) 126/96 (BP Location: Left Arm)   Pulse 71   Temp 98.3 F (36.8 C) (Oral)   Resp 18   SpO2 99%   Physical Exam Vitals and nursing note reviewed.  Constitutional:      General: She is not in acute distress.    Appearance: Normal appearance. She is well-developed. She is not ill-appearing.  HENT:     Head: Normocephalic and atraumatic.     Right Ear: External ear normal.     Left Ear: External ear normal.     Nose: Nose normal.     Mouth/Throat:     Mouth: Mucous membranes are moist.     Pharynx: Oropharynx is clear.   Eyes:     Extraocular Movements: Extraocular movements  intact.     Conjunctiva/sclera: Conjunctivae normal.    Cardiovascular:     Rate and Rhythm: Normal rate and regular rhythm.     Heart sounds: No murmur heard. Pulmonary:     Effort: Pulmonary effort is normal. No respiratory distress.     Breath sounds: Normal breath sounds.  Abdominal:     Palpations: Abdomen is soft.     Tenderness: There is no abdominal tenderness.   Musculoskeletal:        General: No swelling. Normal range of motion.     Cervical back: Normal range of motion and neck supple. No rigidity.     Comments: Tenderness to palpation of the paraspinal muscles in the lumbar and sacral region.  No step-offs, deformities, ecchymosis or erythema noted on exam.  Patient is  neurovascularly intact.  Patient does have weakness on left side when compared to right, however this is baseline for the patient.   Skin:    General: Skin is warm and dry.     Capillary Refill: Capillary refill takes less than 2 seconds.   Neurological:     General: No focal deficit present.     Mental Status: She is alert and oriented to person, place, and time.     Cranial Nerves: No cranial nerve deficit.     Sensory: No sensory deficit.     Motor: No weakness.     Coordination: Coordination normal.     Gait: Gait normal.   Psychiatric:        Mood and Affect: Mood normal.     (all labs ordered are listed, but only abnormal results are displayed) Labs Reviewed - No data to display  EKG: None  Radiology: DG Lumbar Spine Complete Result Date: 02/20/2024 CLINICAL DATA:  Fall.  Pain. EXAM: LUMBAR SPINE - COMPLETE 4+ VIEW; SACRUM AND COCCYX - 2+ VIEW COMPARISON:  CT abdomen/pelvis dated 11/29/2023. FINDINGS: 5 nonrib bearing lumbar-type vertebral bodies. Vertebral body heights are maintained. No acute fracture. No static listhesis. Degenerative disc changes again noted at L5-S1. Disc spaces are otherwise maintained. SI joints are unremarkable. IMPRESSION: 1. No acute osseous abnormality. 2. Degenerative disc changes again noted at L5-S1. Electronically Signed   By: Harrietta Sherry M.D.   On: 02/20/2024 13:10   DG Sacrum/Coccyx Result Date: 02/20/2024 CLINICAL DATA:  Fall.  Pain. EXAM: LUMBAR SPINE - COMPLETE 4+ VIEW; SACRUM AND COCCYX - 2+ VIEW COMPARISON:  CT abdomen/pelvis dated 11/29/2023. FINDINGS: 5 nonrib bearing lumbar-type vertebral bodies. Vertebral body heights are maintained. No acute fracture. No static listhesis. Degenerative disc changes again noted at L5-S1. Disc spaces are otherwise maintained. SI joints are unremarkable. IMPRESSION: 1. No acute osseous abnormality. 2. Degenerative disc changes again noted at L5-S1. Electronically Signed   By: Harrietta Sherry M.D.    On: 02/20/2024 13:10     Procedures   Medications Ordered in the ED  ketorolac (TORADOL) 15 MG/ML injection 15 mg (has no administration in time range)  lidocaine (LIDODERM) 5 % 2 patch (has no administration in time range)                                    Medical Decision Making  This patient presents to the ED for concern of fall differential diagnosis includes vertebral fracture, musculoskeletal pain   Imaging Studies ordered:  I ordered imaging studies including lumbar spine and sacrum/coccyx x-rays I independently visualized and interpreted imaging  which showed no acute osseous abnormalities.  Degenerative disc changes noted at L5-S1 which has been seen on previous studies I agree with the radiologist interpretation   Medicines ordered and prescription drug management:  I ordered medication including Toradol and Lidoderm patches I have reviewed the patients home medicines and have made adjustments as needed   Problem List / ED Course:  Consider for admission or further workup however patient's vital signs, physical exam, and imaging were reassuring.  Patient given Toradol and Lidoderm patches while in the ED.  Patient given short course of muscle relaxers and Lidoderm patches outpatient.  Patient also advised to take Tylenol  Motrin  as needed for pain.  Patient given return precautions.  I feel patient is safe for discharge at this time.       Final diagnoses:  Fall, initial encounter  Acute low back pain, unspecified back pain laterality, unspecified whether sciatica present    ED Discharge Orders          Ordered    methocarbamol (ROBAXIN) 750 MG tablet  4 times daily        02/20/24 1331    lidocaine (LIDODERM) 5 %  Every 24 hours        02/20/24 1331               Francis Ileana SAILOR, PA-C 02/20/24 1333    Horton, Roxie HERO, DO 02/20/24 1546

## 2024-03-04 ENCOUNTER — Ambulatory Visit (INDEPENDENT_AMBULATORY_CARE_PROVIDER_SITE_OTHER): Admitting: Licensed Clinical Social Worker

## 2024-03-04 DIAGNOSIS — F339 Major depressive disorder, recurrent, unspecified: Secondary | ICD-10-CM | POA: Diagnosis not present

## 2024-03-04 NOTE — Progress Notes (Unsigned)
 THERAPIST PROGRESS NOTE   Session Date: 03/04/2024  Session Time: 1408 - 1501 Virtual Visit via Video Note  I connected with [name] on 03/04/24 at  2:00 PM EDT by a video enabled telemedicine application and verified that I am speaking with the correct person using two identifiers.  Location: Patient: Home Provider: Home Office   I discussed the limitations of evaluation and management by telemedicine and the availability of in person appointments. The patient expressed understanding and agreed to proceed.  The patient was advised to call back or seek an in-person evaluation if the symptoms worsen or if the condition fails to improve as anticipated.  I provided 53 minutes of non-face-to-face time during this encounter.  Participation Level: Active  Behavioral Response: CasualAlertEuthymic  Type of Therapy: Individual Therapy  Treatment Goals addressed: *** LTG: Reduce frequency, intensity, and duration of depression symptoms so that daily functioning is improved (OP Depression) LTG: Increase coping skills to manage depression and improve ability to perform daily activities (OP Depression) STG: [name] will identify cognitive patterns and beliefs that support depression (OP Depression) LTG: Not feel hopeless or unworthy of anyone, and unable to do anything for anybody (OP Depression) STG: Improve my sleep habits (OP Depression) LTG: Reduce frequency of over thinking and increased worries (Anxiety)  Progress Towards Goals: Progressing  Interventions: CBT, Motivational Interviewing, Solution Focused, and Supportive  Summary: [name] is a 48 y.o. female with past psych history of MDD, recurrent and GAD, presenting for follow-up therapy session in efforts to improve management of depressive and anxious symptoms.   Patient actively engaged in session, presenting in pleasant moods and congruent affect throughout duration of visit. 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 Pt detailed of things going well over recent weeks, getting little things to decorate house for herself, since separation and continuing to find self becoming increasingly accepting of ending of marriage. 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 Revisited pt's individual perspective surrounding separation and potential divorce, sharing of having not experienced any depression and anxiety surrounding situation and finding acceptance and peace in inability to fix or force resolution, sharing of finding enjoyment in life.  Patient engaged in further reflection of presenting needs of mother, family history, parental history, own childhood history, history of early parenthood, history of relationship with the daughter, and various challenges experienced throughout such relationships.  Patient responded well to interventions. Patient continues to meet criteria for MDD, recurrent, and GAD. Patient will continue to benefit from engagement in outpatient therapy due to being the least restrictive service to meet presenting needs.      03/04/2024    2:25 PM 01/29/2024    3:39 PM 01/15/2024    1:11 PM 12/25/2023    1:18 PM  GAD 7 : Generalized Anxiety Score  Nervous, Anxious, on Edge 0 1 1 0  Control/stop worrying 1 1 1 1   Worry too much - different things 0 1 1 1   Trouble relaxing 0 0 1 1  Restless 0 0 1 1  Easily annoyed or irritable  0 1 1 1   Afraid - awful might happen 0 1 0 1  Total GAD 7 Score 1 5 6 6   Anxiety Difficulty Not difficult at all Not  difficult at all Somewhat difficult Somewhat difficult      03/04/2024    2:27 PM 01/29/2024    3:41 PM 01/15/2024    1:14 PM 12/25/2023    1:21 PM 12/14/2023    8:19 AM  Depression screen PHQ 2/9  Decreased Interest 0 0 0 1 1  Down, Depressed, Hopeless 0 0 1 1 1   PHQ - 2 Score 0 0 1 2 2   Altered sleeping 1 1 0 1 2  Tired, decreased energy 0 0 0 1 2  Change in appetite 0 0 1 1 1   Feeling bad or failure about yourself  0 1 0 1 1  Trouble concentrating 1 1 1 1 1   Moving slowly or fidgety/restless 0 0 1 0 1  Suicidal thoughts 0 0 0 0 0  PHQ-9 Score 2 3 4 7 10   Difficult doing work/chores Not difficult at all Not difficult at all Somewhat difficult Somewhat difficult Somewhat difficult   Suicidal/Homicidal: Nowithout intent/plan  Therapist Response: Clinician utilized CBT, MI, solution focused, and supportive reflection interventions to address presenting stressors and sxs. Clinician actively engaged pt in check-in, assessing mood and affect.  Clinician ***    actively greeted patient upon joining virtual visit, assessing presenting moods and affect, openly engaging in introductory check-in, eliciting pt's brief recounts of daily and weekly events, and factors contributing to presenting moods.  Actively listened to patient's recounts of past week, utilizing open-ended questions to support patient in identifying of thoughts, feelings, and perspectives surrounding progressions of the past week.  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  Further supported patient and actively listening to recounts of individual and family history, and processing impact and/or implications on  various relationships.  Clinician reassessed severity of presenting sxs, and presence of any safety concerns. Clinician provided support and empathy to patient during session.  Plan: Return again in 1 weeks.  Diagnosis:  Encounter Diagnosis  Name Primary?   Depression, recurrent (HCC) Yes     Collaboration of Care: Other none necessary at this time.  Patient/Guardian was advised Release of Information must be obtained prior to any record release in order to collaborate their care with an outside provider. Patient/Guardian was advised if they have not already done so to contact the registration department to sign all necessary forms in order for us  to release information regarding their care.   Consent: Patient/Guardian gives verbal consent for treatment and assignment of benefits for services provided during this visit. Patient/Guardian expressed understanding and agreed to proceed.   Lynwood JONETTA Maris, MSW, LCSW 03/04/2024,  2:30 PM

## 2024-03-05 ENCOUNTER — Ambulatory Visit (INDEPENDENT_AMBULATORY_CARE_PROVIDER_SITE_OTHER): Admitting: Nurse Practitioner

## 2024-03-05 ENCOUNTER — Encounter: Payer: Self-pay | Admitting: Nurse Practitioner

## 2024-03-05 VITALS — BP 136/78 | HR 79 | Temp 98.6°F | Ht 68.5 in | Wt 152.6 lb

## 2024-03-05 DIAGNOSIS — W19XXXD Unspecified fall, subsequent encounter: Secondary | ICD-10-CM

## 2024-03-05 DIAGNOSIS — M816 Localized osteoporosis [Lequesne]: Secondary | ICD-10-CM | POA: Diagnosis not present

## 2024-03-05 DIAGNOSIS — F339 Major depressive disorder, recurrent, unspecified: Secondary | ICD-10-CM

## 2024-03-05 DIAGNOSIS — R634 Abnormal weight loss: Secondary | ICD-10-CM | POA: Diagnosis not present

## 2024-03-05 DIAGNOSIS — F411 Generalized anxiety disorder: Secondary | ICD-10-CM

## 2024-03-05 MED ORDER — HYDROXYZINE PAMOATE 25 MG PO CAPS: 25.0000 mg | ORAL_CAPSULE | Freq: Every day | ORAL | 5 refills | Status: DC

## 2024-03-05 NOTE — Assessment & Plan Note (Addendum)
 Difficulty fallin asleep due to racing thoughts No sleep improvement with remeron  and doxepin . Marijuana use 1x/week No ALCOHOL use  No nicotine  No caffeine use  Maintain CBT sessions Sent atarax  25mg  at hs F/up in 67month

## 2024-03-05 NOTE — Progress Notes (Signed)
 Established Patient Visit  Patient: [name]   DOB: 02-06-1976   48 y.o. Female  MRN: [id number] Visit Date: 03/05/2024  Subjective:    Chief Complaint  Patient presents with   Follow-up    Recent fall seen in ED   HPI Depression, recurrent (HCC) Difficulty fallin asleep due to racing thoughts No sleep improvement with remeron  and doxepin . Marijuana use 1x/week No ALCOHOL use  No nicotine  No caffeine use  Maintain CBT sessions Sent atarax  25mg  at hs F/up in 24month    Osteoporosis Unable to afford prolia  injection copay: $357 No improvement with fosamax . Entered reclast IV order Advised to maintain calcium  and vit D supplement  Weight loss, non-intentional Agreed to HIV test No weight loss noted today Wt Readings from Last 3 Encounters:  03/05/24 152 lb 9.6 oz (69.2 kg)  11/21/23 148 lb 3.2 oz (67.2 kg)  10/16/23 151 lb (68.5 kg)    Fall a work, slipped on poodle of water in the bathroom, had ED visit yesterday, no fracture on lumbar spine, sacrum and coccyx x-ray. Today she reports Persistent lower back and tail bone pain, worse with prolong sitting and bending. No paresthesia, no pelvic pain, No difficulty with GU/GI function.  Wt Readings from Last 3 Encounters:  03/05/24 152 lb 9.6 oz (69.2 kg)  11/21/23 148 lb 3.2 oz (67.2 kg)  10/16/23 151 lb (68.5 kg)    Reviewed medical, surgical, and social history today  Medications: Outpatient Medications Prior to Visit  Medication Sig   amLODipine  (NORVASC ) 5 MG tablet Take 1 tablet (5 mg total) by mouth at bedtime.   Cholecalciferol (VITAMIN D3) 25 MCG (1000 UT) CAPS Take by mouth.   cromolyn (OPTICROM) 4 % ophthalmic solution Place 1 drop into both eyes as needed.   cycloSPORINE (RESTASIS) 0.05 % ophthalmic emulsion 1 drop 2 (two) times daily.   HYDROcodone -acetaminophen  (NORCO/VICODIN) 5-325 MG tablet Take 1 tablet by mouth every 6 (six) hours as needed.   ibuprofen  (ADVIL ) 600 MG tablet TAKE  1 TABLET (600 MG TOTAL) BY MOUTH EVERY 12 (TWELVE) HOURS AS NEEDED (WITH FOOD).   Multiple Vitamins-Minerals (ONE-A-DAY VITACRAVES ADULT) CHEW Chew 1 each by mouth daily.   predniSONE  (DELTASONE ) 2.5 MG tablet Take 2.5 mg by mouth daily.   riTUXimab  (RITUXAN ) 500 MG/50ML injection Inject 1,000 mg into the vein every 6 (six) months.    valACYclovir  (VALTREX ) 500 MG tablet Take 500 mg by mouth as needed (outbreak). Take 1 tablet as needed for onset out breaks   denosumab  (PROLIA ) 60 MG/ML SOSY injection Inject 60 mg into the skin every 6 (six) months.   [DISCONTINUED] doxepin  (SINEQUAN ) 50 MG capsule Take 1 capsule (50 mg total) by mouth at bedtime. (Patient not taking: Reported on 03/05/2024)   [DISCONTINUED] lidocaine  (LIDODERM ) 5 % Place 1 patch onto the skin daily. Remove & Discard patch within 12 hours or as directed by MD (Patient not taking: Reported on 03/05/2024)   No facility-administered medications prior to visit.   Reviewed past medical and social history.   ROS per HPI above      Objective:  BP 136/78 (BP Location: Left Arm, Patient Position: Sitting, Cuff Size: Normal)   Pulse 79   Temp 98.6 F (37 C) (Oral)   Ht 5' 8.5 (1.74 m)   Wt 152 lb 9.6 oz (69.2 kg)   LMP 03/02/2024   SpO2 98%   BMI 22.87 kg/m  Physical Exam Vitals and nursing note reviewed.  Cardiovascular:     Rate and Rhythm: Normal rate.     Pulses: Normal pulses.  Pulmonary:     Effort: Pulmonary effort is normal.  Musculoskeletal:     Thoracic back: Tenderness present. No spasms or bony tenderness. Normal range of motion.     Lumbar back: Tenderness present. Normal range of motion. Negative right straight leg raise test and negative left straight leg raise test.     Right hip: Normal.     Left hip: Normal.     Right upper leg: Normal.     Left upper leg: Normal.     Right knee: Normal.     Left knee: Normal.     Right lower leg: No edema.     Left lower leg: No edema.     Comments:  Ambulates with cane.  Neurological:     Mental Status: She is oriented to person, place, and time.     No results found for any visits on 03/05/24.    Assessment & Plan:    Problem List Items Addressed This Visit     Depression, recurrent (HCC)   Difficulty fallin asleep due to racing thoughts No sleep improvement with remeron  and doxepin . Marijuana use 1x/week No ALCOHOL use  No nicotine  No caffeine use  Maintain CBT sessions Sent atarax  25mg  at hs F/up in 73month        Relevant Medications   hydrOXYzine  (VISTARIL ) 25 MG capsule   GAD (generalized anxiety disorder)   Relevant Medications   hydrOXYzine  (VISTARIL ) 25 MG capsule   Osteoporosis   Unable to afford prolia  injection copay: $357 No improvement with fosamax . Entered reclast IV order Advised to maintain calcium  and vit D supplement      Weight loss, non-intentional   Agreed to HIV test No weight loss noted today Wt Readings from Last 3 Encounters:  03/05/24 152 lb 9.6 oz (69.2 kg)  11/21/23 148 lb 3.2 oz (67.2 kg)  10/16/23 151 lb (68.5 kg)         Relevant Orders   HIV Antibody (routine testing w rflx)   Other Visit Diagnoses       Fall, subsequent encounter    -  Primary      Return in about 4 weeks (around 04/02/2024) for GAD and back pain.     Roselie Mood, NP

## 2024-03-05 NOTE — Assessment & Plan Note (Signed)
 Agreed to HIV test No weight loss noted today Wt Readings from Last 3 Encounters:  03/05/24 152 lb 9.6 oz (69.2 kg)  11/21/23 148 lb 3.2 oz (67.2 kg)  10/16/23 151 lb (68.5 kg)

## 2024-03-05 NOTE — Assessment & Plan Note (Signed)
 Unable to afford prolia  injection copay: $357 No improvement with fosamax . Entered reclast IV order Advised to maintain calcium  and vit D supplement

## 2024-03-05 NOTE — Telephone Encounter (Addendum)
    Patient can get Prolia  through pharmacy. She would only have to pay 20% admin fee (approximately $25). Please advise if patient wants to proceed with Prolia .

## 2024-03-06 ENCOUNTER — Telehealth: Payer: Self-pay

## 2024-03-06 ENCOUNTER — Ambulatory Visit: Payer: Self-pay | Admitting: Nurse Practitioner

## 2024-03-06 LAB — HIV ANTIBODY (ROUTINE TESTING W REFLEX): HIV 1&2 Ab, 4th Generation: NONREACTIVE

## 2024-03-06 NOTE — Telephone Encounter (Signed)
 Roselie, patient will be scheduled as soon as possible.  Auth Submission: NO AUTH NEEDED Site of care: Site of care: [facility] Payer: [employer] dual complete medicare Medication & CPT/J Code(s) submitted: Reclast (Zolendronic acid) [id number] Diagnosis Code:  Route of submission (phone, fax, portal): portal Phone # Fax # Auth type: Buy/Bill PB Units/visits requested: 5mg  x 1 dose Reference number: [id number] Approval from: 03/06/24 to 08/27/24

## 2024-03-06 NOTE — Telephone Encounter (Signed)
 Called and left a voice message for the patient asking to give me a call back at the office. I will also send a MyChart message to patient with information from PA team about Prolia .

## 2024-03-07 ENCOUNTER — Telehealth: Payer: Self-pay

## 2024-03-07 NOTE — Telephone Encounter (Signed)
 Copied from CRM [id number]. Topic: General - Other >> Mar 06, 2024  4:54 PM Chasity T wrote: Reason for CRM: Patient has called back to return call for nurse. Please contact her back for discussion.

## 2024-03-12 ENCOUNTER — Ambulatory Visit

## 2024-03-12 ENCOUNTER — Other Ambulatory Visit: Payer: Self-pay

## 2024-03-12 VITALS — BP 123/81 | HR 61 | Temp 97.9°F | Resp 12 | Ht 68.5 in | Wt 149.8 lb

## 2024-03-12 DIAGNOSIS — M816 Localized osteoporosis [Lequesne]: Secondary | ICD-10-CM | POA: Diagnosis not present

## 2024-03-12 MED ORDER — DIPHENHYDRAMINE HCL 25 MG PO CAPS
25.0000 mg | ORAL_CAPSULE | Freq: Once | ORAL | Status: AC
  Administered 2024-03-12: 25 mg via ORAL
  Filled 2024-03-12: qty 1

## 2024-03-12 MED ORDER — ZOLEDRONIC ACID 5 MG/100ML IV SOLN
5.0000 mg | Freq: Once | INTRAVENOUS | Status: AC
  Administered 2024-03-12: 5 mg via INTRAVENOUS
  Filled 2024-03-12: qty 100

## 2024-03-12 MED ORDER — ACETAMINOPHEN 325 MG PO TABS
650.0000 mg | ORAL_TABLET | Freq: Once | ORAL | Status: AC
  Administered 2024-03-12: 650 mg via ORAL
  Filled 2024-03-12: qty 2

## 2024-03-12 MED ORDER — SODIUM CHLORIDE 0.9 % IV SOLN: INTRAVENOUS | Status: DC

## 2024-03-12 MED ORDER — DENOSUMAB 60 MG/ML ~~LOC~~ SOSY
60.0000 mg | PREFILLED_SYRINGE | Freq: Once | SUBCUTANEOUS | 0 refills | Status: AC
  Filled 2024-03-12: qty 1, 1d supply, fill #0
  Filled 2024-03-13: qty 1, 180d supply, fill #0

## 2024-03-12 NOTE — Telephone Encounter (Signed)
 Prescription RF sent to [facility] on 03/12/24 for Prolia  to shipped to the office.

## 2024-03-12 NOTE — Progress Notes (Signed)
 Diagnosis: Osteoporosis  Provider:  Lonna Coder MD  Procedure: IV Infusion  IV Type: Peripheral, IV Location: R Antecubital  Reclast  (Zolendronic Acid), Dose: 5 mg  Infusion Start Time: 1525  Infusion Stop Time: 1608  Post Infusion IV Care: Observation period completed and Peripheral IV Discontinued  Discharge: Condition: Stable, Destination: Home . AVS Provided  Performed by:  Donny Childes, RN

## 2024-03-12 NOTE — Patient Instructions (Signed)
 For your Rituxan  infusions please have provider contact us   Referral Fax [phone], Phone [phone]

## 2024-03-12 NOTE — Addendum Note (Signed)
 Addended by: ALESSANDRA DEDRA PARAS on: 03/12/2024 12:51 PM   Modules accepted: Orders

## 2024-03-13 ENCOUNTER — Other Ambulatory Visit: Payer: Self-pay

## 2024-03-13 ENCOUNTER — Other Ambulatory Visit (HOSPITAL_COMMUNITY): Payer: Self-pay

## 2024-03-13 ENCOUNTER — Ambulatory Visit: Admitting: Nurse Practitioner

## 2024-03-13 NOTE — Progress Notes (Signed)
 Specialty Pharmacy Initial Fill Coordination Note  [name] is a 48 y.o. female contacted today regarding initial fill of specialty medication(s) Denosumab  (PROLIA )   Patient requested Courier to Provider Office   Delivery date: 03/17/24   Verified address: [facility] [place]-[address]   Medication will be filled on 7/18.   Patient is aware of $0 copayment.

## 2024-03-13 NOTE — Progress Notes (Signed)
 Pharmacy Patient Advocate Encounter  Insurance verification completed.   The patient is insured through [employer] claim for Prolia . Co-pay is $0.  This test claim was processed through [facility]- copay amounts may vary at other pharmacies due to pharmacy/plan contracts, or as the patient moves through the different stages of their insurance plan.

## 2024-03-14 ENCOUNTER — Other Ambulatory Visit: Payer: Self-pay

## 2024-03-17 ENCOUNTER — Other Ambulatory Visit: Payer: Self-pay

## 2024-03-17 NOTE — Telephone Encounter (Signed)
 Prolia  arrived to the office for pt. Noticied documentation in pt chart of Reclast  infusion administered 03/12/24. Pt will not need thereapy for osteoporosis for 1 year per provider. Next DEXA scan due 10/2024 and therapy needs will be reevaluated at that time. Will return Prolia  injection to [facility] pharmacy.

## 2024-03-18 ENCOUNTER — Ambulatory Visit (INDEPENDENT_AMBULATORY_CARE_PROVIDER_SITE_OTHER): Admitting: Licensed Clinical Social Worker

## 2024-03-18 DIAGNOSIS — F339 Major depressive disorder, recurrent, unspecified: Secondary | ICD-10-CM | POA: Diagnosis not present

## 2024-03-18 DIAGNOSIS — F411 Generalized anxiety disorder: Secondary | ICD-10-CM | POA: Diagnosis not present

## 2024-03-18 NOTE — Progress Notes (Unsigned)
 THERAPIST PROGRESS NOTE   Session Date: 03/18/2024  Session Time: 1410 - 1504 Virtual Visit via Video Note  I connected with [name] on 03/18/24 at  2:00 PM EDT by a video enabled telemedicine application and verified that I am speaking with the correct person using two identifiers.  Location: Patient: Home Provider: Home Office   I discussed the limitations of evaluation and management by telemedicine and the availability of in person appointments. The patient expressed understanding and agreed to proceed.  The patient was advised to call back or seek an in-person evaluation if the symptoms worsen or if the condition fails to improve as anticipated.  I provided 56 minutes of non-face-to-face time during this encounter.  Participation Level: Active  Behavioral Response: CasualAlertEuthymic  Type of Therapy: Individual Therapy  Treatment Goals addressed:   Progressing (4) LTG: Reduce frequency, intensity, and duration of depression symptoms so that daily functioning is improved (OP Depression) LTG: Increase coping skills to manage depression and improve ability to perform daily activities (OP Depression) STG: Caree will identify cognitive patterns and beliefs that support depression (OP Depression) LTG: Reduce frequency of over thinking and increased worries (Anxiety)  Completed/Met (2) LTG: Not feel hopeless or unworthy of anyone, and unable to do anything for anybody (OP Depression) STG: Improve my sleep habits (OP Depression)  Progress Towards Goals: Progressing  Interventions: CBT, Motivational Interviewing, Solution Focused, and Supportive  Summary: [name] is a 48 y.o. female with past psych history of MDD, recurrent and GAD, presenting for follow-up therapy session in efforts to improve management of depressive and anxious symptoms.   Patient actively engaged in session, presenting in pleasant moods and congruent affect throughout duration of visit.  Patient actively engaged in check-in, sharing of Doing good, further detailing of being busy, having worked for 2 hours earlier today, and now being at home cleaning up a little with pt having floors replaced at home. 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 Pt provided additional updates surrounding previously noted stressor specific to ABA treatment for grandson, sharing of having noticed herself, as well as receiving reports from daycare teachers, of grandsons observable improvements over the past couple of weeks with receiving added support.  Actively reflected on individual thoughts and feelings surrounding prior stress regarding adequate care and support for grandson in daycare setting, reflecting on reduction in concerns.  Engaged in reassessing presenting depressive and anxious symptoms via PHQ-9 and GAD-7, exploring minimal observed symptoms and maintained reduction of symptoms in both areas.  Patient responded well to interventions. Patient continues to meet criteria for MDD, recurrent, and GAD. Patient will continue to benefit from engagement in outpatient therapy due to being the least restrictive service to meet presenting needs.      03/18/2024    2:46 PM 03/04/2024    2:25 PM 01/29/2024    3:39 PM 01/15/2024    1:11 PM  GAD 7 : Generalized Anxiety Score  Nervous, Anxious, on Edge 0 0 1 1  Control/stop worrying 0 1 1 1   Worry too much - different things 0 0 1 1  Trouble relaxing 1 0 0 1  Restless 0 0 0 1  Easily annoyed or irritable 0 0 1 1  Afraid - awful might happen 0 0 1 0  Total GAD 7 Score 1 1 5  6  Anxiety Difficulty Not difficult at all Not difficult at all Not difficult at all Somewhat difficult      03/18/2024    2:49 PM 03/04/2024    2:27 PM 01/29/2024    3:41 PM 01/15/2024     1:14 PM 12/25/2023    1:21 PM  Depression screen PHQ 2/9  Decreased Interest 0 0 0 0 1  Down, Depressed, Hopeless 0 0 0 1 1  PHQ - 2 Score 0 0 0 1 2  Altered sleeping 0 1 1 0 1  Tired, decreased energy 0 0 0 0 1  Change in appetite 0 0 0 1 1  Feeling bad or failure about yourself  0 0 1 0 1  Trouble concentrating 1 1 1 1 1   Moving slowly or fidgety/restless 0 0 0 1 0  Suicidal thoughts 0 0 0 0 0  PHQ-9 Score 1 2 3 4 7   Difficult doing work/chores Not difficult at all Not difficult at all Not difficult at all Somewhat difficult Somewhat difficult   Suicidal/Homicidal: Nowithout intent/plan  Therapist Response: Clinician utilized CBT, MI, solution focused, and supportive reflection interventions to address presenting stressors and sxs. Clinician actively engaged pt in check-in, assessing mood and affect.  Clinician actively greeted patient upon joining virtual visit, assessing presenting moods and affect, it and engaging in introductory check-in. Openly engaged patient in exploring recent events, and factors contributing to maintained improvements in moods. Utilized open-ended questions to elicit patient's thoughts, feelings, and perspectives surrounding events of the past 2 weeks, further processing patient's observed improvements in moods and further explored contributing factors such as transitioning workplace and profession, and observable progressions and grandsons behaviors and management of emotions with recent initiation of ABA services. Reassessed presenting depressive and anxious symptoms via PHQ-9 and GAD-7, engaging in further reflection of individual observations and maintained reduction in symptoms and improved moods.  Clinician reassessed severity of presenting sxs, and presence of any safety concerns. Clinician provided support and empathy to patient during session.  Plan: Return again in 2 weeks.  Diagnosis:  Encounter Diagnoses  Name Primary?   Depression, recurrent  (HCC) Yes   GAD (generalized anxiety disorder)     Collaboration of Care: Other none necessary at this time.  Patient/Guardian was advised Release of Information must be obtained prior to any record release in order to collaborate their care with an outside provider. Patient/Guardian was advised if they have not already done so to contact the registration department to sign all necessary forms in order for us  to release information regarding their care.   Consent: Patient/Guardian gives verbal consent for treatment and assignment of benefits for services provided during this visit. Patient/Guardian expressed understanding and agreed to proceed.   Lynwood JONETTA Maris, MSW, LCSW 03/18/2024,  2:50 PM

## 2024-03-21 ENCOUNTER — Other Ambulatory Visit: Payer: Self-pay

## 2024-03-31 DIAGNOSIS — M79604 Pain in right leg: Secondary | ICD-10-CM | POA: Diagnosis not present

## 2024-03-31 DIAGNOSIS — R94131 Abnormal electromyogram [EMG]: Secondary | ICD-10-CM | POA: Diagnosis not present

## 2024-03-31 DIAGNOSIS — M6281 Muscle weakness (generalized): Secondary | ICD-10-CM | POA: Diagnosis not present

## 2024-03-31 DIAGNOSIS — M3320 Polymyositis, organ involvement unspecified: Secondary | ICD-10-CM | POA: Diagnosis not present

## 2024-03-31 DIAGNOSIS — Z8739 Personal history of other diseases of the musculoskeletal system and connective tissue: Secondary | ICD-10-CM | POA: Diagnosis not present

## 2024-03-31 DIAGNOSIS — M79605 Pain in left leg: Secondary | ICD-10-CM | POA: Diagnosis not present

## 2024-04-02 ENCOUNTER — Ambulatory Visit: Admitting: Nurse Practitioner

## 2024-04-05 ENCOUNTER — Other Ambulatory Visit: Payer: Self-pay | Admitting: Nurse Practitioner

## 2024-04-05 DIAGNOSIS — F339 Major depressive disorder, recurrent, unspecified: Secondary | ICD-10-CM

## 2024-04-05 DIAGNOSIS — F411 Generalized anxiety disorder: Secondary | ICD-10-CM

## 2024-04-07 ENCOUNTER — Ambulatory Visit: Admitting: Nurse Practitioner

## 2024-04-07 ENCOUNTER — Encounter: Payer: Self-pay | Admitting: Nurse Practitioner

## 2024-04-07 VITALS — BP 136/80 | HR 52 | Temp 97.0°F | Resp 18 | Wt 155.4 lb

## 2024-04-07 DIAGNOSIS — J340 Abscess, furuncle and carbuncle of nose: Secondary | ICD-10-CM

## 2024-04-07 DIAGNOSIS — M533 Sacrococcygeal disorders, not elsewhere classified: Secondary | ICD-10-CM

## 2024-04-07 DIAGNOSIS — M3320 Polymyositis, organ involvement unspecified: Secondary | ICD-10-CM | POA: Diagnosis not present

## 2024-04-07 DIAGNOSIS — I10 Essential (primary) hypertension: Secondary | ICD-10-CM

## 2024-04-07 DIAGNOSIS — W19XXXD Unspecified fall, subsequent encounter: Secondary | ICD-10-CM

## 2024-04-07 DIAGNOSIS — M5137 Other intervertebral disc degeneration, lumbosacral region with discogenic back pain only: Secondary | ICD-10-CM

## 2024-04-07 DIAGNOSIS — F339 Major depressive disorder, recurrent, unspecified: Secondary | ICD-10-CM

## 2024-04-07 MED ORDER — SALINE SPRAY 0.65 % NA SOLN: 1.0000 | NASAL | Status: DC | PRN

## 2024-04-07 MED ORDER — MUPIROCIN 2 % EX OINT
1.0000 | TOPICAL_OINTMENT | Freq: Two times a day (BID) | CUTANEOUS | 0 refills | Status: AC
Start: 2024-04-07 — End: 2024-04-15

## 2024-04-07 NOTE — Progress Notes (Signed)
 Established Patient Visit  Patient: [name]   DOB: 1976-07-07   48 y.o. Female  MRN: [id number] Visit Date: 04/07/2024  Subjective:    Chief Complaint  Patient presents with   Back Pain    1 month follow up. Pt stated pain is still present (stiffness); she is currently using Hydrocodone  and Advil  for symptoms   [facility] DUE- Hep B vaccine ( patient declined for today)    Anxiety   Sinus Problem    Pt c/o of nasal sores and wetness for few weeks. Pt used OTC neosporin for symptoms    Polymyositis (HCC) Progressive LE muscle weakness, lower back pain and stiffness since fall at home 35month ago. Negative fracture per lumbar spine and sacral x-ray. Current use of hydrocodone , ibuprofen , and heating pad prn.  She agreed to PT referral  HTN (hypertension) BP at goal with amlodipine   BP Readings from Last 3 Encounters:  04/07/24 136/80  03/12/24 123/81  03/05/24 136/78    Maintain med dose F/up in 3months  Depression, recurrent (HCC) Improved sleep with atarax  at hs.  Maintain med dose Use of vistaril  at hs with improvement.  Wt Readings from Last 3 Encounters:  04/07/24 155 lb 6.4 oz (70.5 kg)  03/12/24 149 lb 12.8 oz (67.9 kg)  03/05/24 152 lb 9.6 oz (69.2 kg)    BP Readings from Last 3 Encounters:  04/07/24 136/80  03/12/24 123/81  03/05/24 136/78     Reviewed medical, surgical, and social history today  Medications: Outpatient Medications Prior to Visit  Medication Sig   amLODipine  (NORVASC ) 5 MG tablet Take 1 tablet (5 mg total) by mouth at bedtime.   Cholecalciferol (VITAMIN D3) 25 MCG (1000 UT) CAPS Take by mouth.   cromolyn (OPTICROM) 4 % ophthalmic solution Place 1 drop into both eyes as needed.   cycloSPORINE (RESTASIS) 0.05 % ophthalmic emulsion 1 drop 2 (two) times daily.   denosumab  (PROLIA ) 60 MG/ML SOSY injection Inject 60 mg into the skin every 6 (six) months.   HYDROcodone -acetaminophen  (NORCO/VICODIN) 5-325 MG tablet Take 1  tablet by mouth every 6 (six) hours as needed.   hydrOXYzine  (VISTARIL ) 25 MG capsule Take 1 capsule (25 mg total) by mouth at bedtime.   ibuprofen  (ADVIL ) 600 MG tablet TAKE 1 TABLET (600 MG TOTAL) BY MOUTH EVERY 12 (TWELVE) HOURS AS NEEDED (WITH FOOD).   Multiple Vitamins-Minerals (ONE-A-DAY VITACRAVES ADULT) CHEW Chew 1 each by mouth daily.   predniSONE  (DELTASONE ) 2.5 MG tablet Take 2.5 mg by mouth daily.   riTUXimab  (RITUXAN ) 500 MG/50ML injection Inject 1,000 mg into the vein every 6 (six) months.    valACYclovir  (VALTREX ) 500 MG tablet Take 500 mg by mouth as needed (outbreak). Take 1 tablet as needed for onset out breaks   No facility-administered medications prior to visit.   Reviewed past medical and social history.   ROS per HPI above      Objective:  BP 136/80 (BP Location: Left Arm, Patient Position: Sitting, Cuff Size: Normal)   Pulse (!) 52   Temp (!) 97 F (36.1 C) (Temporal)   Resp 18   Wt 155 lb 6.4 oz (70.5 kg)   SpO2 99%   BMI 23.28 kg/m      Physical Exam Vitals and nursing note reviewed.  Neurological:     Mental Status: She is alert and oriented to person, place, and time.     Motor: Weakness  present.     Gait: Gait abnormal.  Psychiatric:        Mood and Affect: Mood normal.        Behavior: Behavior normal.        Thought Content: Thought content normal.     No results found for any visits on 04/07/24.    Assessment & Plan:    Problem List Items Addressed This Visit     Depression, recurrent (HCC)   Improved sleep with atarax  at hs.  Maintain med dose      HTN (hypertension) - Primary   BP at goal with amlodipine   BP Readings from Last 3 Encounters:  04/07/24 136/80  03/12/24 123/81  03/05/24 136/78    Maintain med dose F/up in 3months      Polymyositis (HCC)   Progressive LE muscle weakness, lower back pain and stiffness since fall at home 71month ago. Negative fracture per lumbar spine and sacral x-ray. Current use of  hydrocodone , ibuprofen , and heating pad prn.  She agreed to PT referral      Relevant Orders   Ambulatory referral to Physical Therapy   Other Visit Diagnoses       Nasal septal ulcer       Relevant Medications   mupirocin  ointment (BACTROBAN ) 2 %   sodium chloride  (OCEAN) 0.65 % SOLN nasal spray     Fall, subsequent encounter       Relevant Orders   Ambulatory referral to Physical Therapy     Sacral back pain       Relevant Orders   Ambulatory referral to Physical Therapy     Degeneration of intervertebral disc of lumbosacral region with discogenic back pain       Relevant Orders   Ambulatory referral to Physical Therapy      Return in about 3 months (around 07/08/2024) for HTN, depression and anxiety, hyperlipidemia (fasting).     Roselie Mood, NP

## 2024-04-07 NOTE — Patient Instructions (Signed)
 Send mychart message if nasal ulcer does not resolve in 1week. Use saline nasal mist to help with nasal irritation and/or dryness

## 2024-04-07 NOTE — Assessment & Plan Note (Signed)
 BP at goal with amlodipine   BP Readings from Last 3 Encounters:  04/07/24 136/80  03/12/24 123/81  03/05/24 136/78    Maintain med dose F/up in 3months

## 2024-04-07 NOTE — Assessment & Plan Note (Signed)
 Improved sleep with atarax  at hs.  Maintain med dose

## 2024-04-07 NOTE — Assessment & Plan Note (Signed)
 Progressive LE muscle weakness, lower back pain and stiffness since fall at home 77month ago. Negative fracture per lumbar spine and sacral x-ray. Current use of hydrocodone , ibuprofen , and heating pad prn.  She agreed to PT referral

## 2024-04-21 ENCOUNTER — Ambulatory Visit (INDEPENDENT_AMBULATORY_CARE_PROVIDER_SITE_OTHER): Admitting: Licensed Clinical Social Worker

## 2024-04-21 DIAGNOSIS — F339 Major depressive disorder, recurrent, unspecified: Secondary | ICD-10-CM | POA: Diagnosis not present

## 2024-04-21 NOTE — Progress Notes (Unsigned)
 THERAPIST PROGRESS NOTE   Session Date: 04/21/2024  Session Time: 1404 - 1448 Virtual Visit via Video Note  I connected with [name] on 04/21/24 at  2:00 PM EDT by a video enabled telemedicine application and verified that I am speaking with the correct person using two identifiers.  Location: Patient: Home Provider: Home Office   I discussed the limitations of evaluation and management by telemedicine and the availability of in person appointments. The patient expressed understanding and agreed to proceed.  The patient was advised to call back or seek an in-person evaluation if the symptoms worsen or if the condition fails to improve as anticipated.  I provided 44 minutes of non-face-to-face time during this encounter.  Participation Level: Active  Behavioral Response: CasualAlertEuthymic  Type of Therapy: Individual Therapy  Treatment Goals addressed:   Progressing (4) LTG: Reduce frequency, intensity, and duration of depression symptoms so that daily functioning is improved (OP Depression) LTG: Increase coping skills to manage depression and improve ability to perform daily activities (OP Depression) STG: Denelda will identify cognitive patterns and beliefs that support depression (OP Depression) LTG: Reduce frequency of over thinking and increased worries (Anxiety)  Completed/Met (2) LTG: Not feel hopeless or unworthy of anyone, and unable to do anything for anybody (OP Depression) STG: Improve my sleep habits (OP Depression)  Progress Towards Goals: Progressing  Interventions: CBT, Motivational Interviewing, Solution Focused, and Supportive  Summary: [name] is a 48 y.o. female with past psych history of MDD, recurrent and GAD, presenting for follow-up therapy session in efforts to improve management of depressive and anxious symptoms.   Patient actively engaged in session, presenting in pleasant moods and congruent affect throughout duration of visit.  Patient actively engaged in check-in, sharing of There's been a lot going on, further detailing of having been terminated by employer due to challenges surrounding disability accommodations and documentation provided by neurologist. Pt shared of having met with office personnel and being terminated two weeks ago due to employers inabilities to accommodate disability needs. Processed how pt has proven to navigate challenges, sharing of having prayed a lot, as well as having others connect with her to support in securing potential opportunities for employment. Pt shared of having been going to the gym 2x/days/wk of the past two weeks, having worked on legs and lower back to get out the house and redirect focus and staying busy. Pt shared of upcoming PT beginning next week to help work on tightness/stiffness surrounding back and legs. Pt further shared of having visited mother 4.5hr away, having not seen her in 47mo., visiting last week, being glad to have seen mother and exploring with aunt of potential plans to relocate her closer. Pt additionally shared of intent to file divorce paperwork in September, proving comfortable with decision. Pt expressed great gratitude for support received through therapy thus far, becoming tearful acknowledging the challenges she was facing 6+ months ago, and sharing feeling that therapy has saved my life.  Patient responded well to interventions. Patient continues to meet criteria for MDD, recurrent, and GAD. Patient will continue to benefit from engagement in outpatient therapy due to being the least restrictive service to meet presenting needs.      04/07/2024    2:18 PM 03/18/2024    2:46 PM 03/04/2024    2:25 PM 01/29/2024    3:39 PM  GAD 7 : Generalized Anxiety Score  Nervous, Anxious, on Edge 0 0 0 1  Control/stop worrying 0 0 1 1  Worry too  much - different things 0 0 0 1  Trouble relaxing 0 1 0 0  Restless 0 0 0 0  Easily annoyed or irritable 0 0 0 1  Afraid -  awful might happen 0 0 0 1  Total GAD 7 Score 0 1 1 5   Anxiety Difficulty Not difficult at all Not difficult at all Not difficult at all Not difficult at all      04/07/2024    1:54 PM 03/18/2024    2:49 PM 03/04/2024    2:27 PM 01/29/2024    3:41 PM 01/15/2024    1:14 PM  Depression screen PHQ 2/9  Decreased Interest 0 0 0 0 0  Down, Depressed, Hopeless 0 0 0 0 1  PHQ - 2 Score 0 0 0 0 1  Altered sleeping 1 0 1 1 0  Tired, decreased energy 0 0 0 0 0  Change in appetite 0 0 0 0 1  Feeling bad or failure about yourself  0 0 0 1 0  Trouble concentrating 1 1 1 1 1   Moving slowly or fidgety/restless 0 0 0 0 1  Suicidal thoughts 0 0 0 0 0  PHQ-9 Score 2 1 2 3 4   Difficult doing work/chores Not difficult at all Not difficult at all Not difficult at all Not difficult at all Somewhat difficult   Suicidal/Homicidal: Nowithout intent/plan  Therapist Response: Clinician utilized CBT, MI, solution focused, and supportive reflection interventions to address presenting stressors and sxs. Clinician actively engaged pt in check-in, assessing mood and affect.  Clinician openly greeted patient upon joining virtual visit, assessing presenting moods and affect. Actively engaged pt in introductory check-in, utilizing open ended questions in order to elicit pt's recounts of events of the past month, presenting stressors and challenges, and individual management of such.  Actively listened to pt's recounts of events, providing support and validation of identified challenges, utilizing socratic questions to further evoke greater critical processing of stressors and individual navigation of challenges.  Provided support and validation of pt's expressed thoughts and feelings, and pt's individual abilities at improving perspectives of events to maintain improved moods.  Clinician reassessed severity of presenting sxs, and presence of any safety concerns. Clinician provided support and empathy to patient during  session.  Plan: Return again in 2 weeks.  Diagnosis:  Encounter Diagnosis  Name Primary?   Depression, recurrent (HCC) Yes   Collaboration of Care: Other none necessary at this time.  Patient/Guardian was advised Release of Information must be obtained prior to any record release in order to collaborate their care with an outside provider. Patient/Guardian was advised if they have not already done so to contact the registration department to sign all necessary forms in order for us  to release information regarding their care.   Consent: Patient/Guardian gives verbal consent for treatment and assignment of benefits for services provided during this visit. Patient/Guardian expressed understanding and agreed to proceed.   Lynwood JONETTA Maris, MSW, LCSW 04/21/2024,  2:04 PM

## 2024-04-30 ENCOUNTER — Ambulatory Visit: Attending: Nurse Practitioner | Admitting: Physical Therapy

## 2024-04-30 ENCOUNTER — Other Ambulatory Visit: Payer: Self-pay

## 2024-04-30 ENCOUNTER — Encounter: Payer: Self-pay | Admitting: Physical Therapy

## 2024-04-30 ENCOUNTER — Ambulatory Visit: Admitting: Physical Therapy

## 2024-04-30 DIAGNOSIS — M533 Sacrococcygeal disorders, not elsewhere classified: Secondary | ICD-10-CM | POA: Diagnosis not present

## 2024-04-30 DIAGNOSIS — R2689 Other abnormalities of gait and mobility: Secondary | ICD-10-CM | POA: Diagnosis not present

## 2024-04-30 DIAGNOSIS — M5459 Other low back pain: Secondary | ICD-10-CM | POA: Diagnosis present

## 2024-04-30 DIAGNOSIS — Z9181 History of falling: Secondary | ICD-10-CM | POA: Insufficient documentation

## 2024-04-30 DIAGNOSIS — R2681 Unsteadiness on feet: Secondary | ICD-10-CM | POA: Diagnosis not present

## 2024-04-30 DIAGNOSIS — M5137 Other intervertebral disc degeneration, lumbosacral region with discogenic back pain only: Secondary | ICD-10-CM | POA: Insufficient documentation

## 2024-04-30 DIAGNOSIS — W19XXXA Unspecified fall, initial encounter: Secondary | ICD-10-CM | POA: Insufficient documentation

## 2024-04-30 DIAGNOSIS — M3320 Polymyositis, organ involvement unspecified: Secondary | ICD-10-CM | POA: Diagnosis not present

## 2024-04-30 NOTE — Therapy (Signed)
 OUTPATIENT PHYSICAL THERAPY THORACOLUMBAR EVALUATION   Patient Name: [name] MRN: [id number] DOB:April 28, 1976, 48 y.o., female Today's Date: 04/30/2024  END OF SESSION:  PT End of Session - 04/30/24 1350     Visit Number 1    Number of Visits 25    Date for PT Re-Evaluation 07/23/24    Authorization Type [employer] Dual and MCD    Authorization Time Period 04/30/24 to 07/23/24    Progress Note Due on Visit 10    PT Start Time 1348    PT Stop Time 1427    PT Time Calculation (min) 39 min    Activity Tolerance Patient tolerated treatment well    Behavior During Therapy [facility] for tasks assessed/performed          Past Medical History:  Diagnosis Date   Allergy    Anxiety    Hyperlipidemia    HYPERTENSION, BENIGN ESSENTIAL 06/16/2008   Qualifier: Diagnosis of  By: Gladis FNP, Nykedtra     Osteoporosis    Polymyositis (HCC) 2009   RLS (restless legs syndrome)    Vitamin D  deficiency    Past Surgical History:  Procedure Laterality Date   BRAIN SURGERY     Patient Active Problem List   Diagnosis Date Noted   Weight loss, non-intentional 11/21/2023   Longitudinal melanonychia 10/16/2023   HTN (hypertension) 10/16/2023   Syrinx (HCC) 05/11/2023   Gastroesophageal reflux disease 05/06/2021   GAD (generalized anxiety disorder) 01/06/2021   Depression, recurrent (HCC) 01/06/2021   RLS (restless legs syndrome) 11/07/2018   Long term systemic steroid user 11/07/2018   Vitamin D  deficiency 02/21/2017   Genital herpes simplex type 2 02/21/2017   Osteoporosis 01/30/2014   HYPERCHOLESTEROLEMIA 01/28/2009   RENAL CALCULUS, RIGHT 06/18/2008   Polymyositis (HCC) 06/16/2008   CHIARI MALFORMATION 11/20/2007   Left-sided weakness 11/18/2007    PCP: Katheen Roselie Rockford, NP  REFERRING PROVIDER: Katheen Roselie Rockford, NP  REFERRING DIAG:  Diagnosis  W19.XXXD (ICD-10-CM) - Fall, subsequent encounter  M53.3 (ICD-10-CM) - Sacral back pain  M51.370 (ICD-10-CM) - Degeneration of  intervertebral disc of lumbosacral region with discogenic back pain  M33.20 (ICD-10-CM) - Polymyositis (HCC)    Rationale for Evaluation and Treatment: Rehabilitation  THERAPY DIAG:  Other low back pain  Other abnormalities of gait and mobility  Unsteadiness on feet  History of falling  ONSET DATE: Fall in July  SUBJECTIVE:                                                                                                                                                                                           SUBJECTIVE STATEMENT:  I had a fall in July, fell on my butt. I have polymyositis and since the fall I'm extra stiff, have been trying to do my baseline exercises. Having some on and off pain, but stiffness in my back has gotten worse. I feel like I'm getting weaker in my back. In 2010 I had to have emergency brain surgery for [facility] Chiari, after 3 more months they found that I had polymyositis. Currently getting infusions once a year for the polymyositis, in remission but from 2010 to 2016 I was in a WC.   PERTINENT HISTORY:  See above   PAIN:  Are you having pain? Yes: NPRS scale: 2/10 now, at worst 8/10  Pain location: lumbar from tailbone up  Pain description: stiffness  Aggravating factors: standing or sitting too long  Relieving factors: heat, lidocaine  patches    PRECAUTIONS: Fall and Other: polymyositis   RED FLAGS: None   WEIGHT BEARING RESTRICTIONS: No  FALLS:  Has patient fallen in last 6 months? Yes. Number of falls 1- slipped in some water in the bathroom   LIVING ENVIRONMENT: Lives with: lives alone Lives in: House/apartment Stairs: 1 STE to enter, no steps inside  Has following equipment at home: Single point cane, shower chair, grab bars   OCCUPATION: on disability   PLOF: Independent, Independent with basic ADLs, Independent with gait, and Independent with transfers  PATIENT GOALS: make back stronger and address stiffness, build up leg strength    NEXT MD VISIT: November 11th with PCP   OBJECTIVE:  Note: Objective measures were completed at Evaluation unless otherwise noted.  DIAGNOSTIC FINDINGS:   EXAM: LUMBAR SPINE - COMPLETE 4+ VIEW; SACRUM AND COCCYX - 2+ VIEW   COMPARISON:  CT abdomen/pelvis dated 11/29/2023.   FINDINGS: 5 nonrib bearing lumbar-type vertebral bodies.   Vertebral body heights are maintained. No acute fracture. No static listhesis.   Degenerative disc changes again noted at L5-S1. Disc spaces are otherwise maintained.   SI joints are unremarkable.   IMPRESSION: 1. No acute osseous abnormality. 2. Degenerative disc changes again noted at L5-S1.  CLINICAL DATA:  Fall.  Pain.   EXAM: LUMBAR SPINE - COMPLETE 4+ VIEW; SACRUM AND COCCYX - 2+ VIEW   COMPARISON:  CT abdomen/pelvis dated 11/29/2023.   FINDINGS: 5 nonrib bearing lumbar-type vertebral bodies.   Vertebral body heights are maintained. No acute fracture. No static listhesis.   Degenerative disc changes again noted at L5-S1. Disc spaces are otherwise maintained.   SI joints are unremarkable.   IMPRESSION: 1. No acute osseous abnormality. 2. Degenerative disc changes again noted at L5-S1.      COGNITION: Overall cognitive status: Within functional limits for tasks assessed     SENSATION: Not tested   POSTURE: rounded shoulders and forward head    LUMBAR ROM:   AROM eval  Flexion WNL knee hyperextension   Extension WNL   Right lateral flexion WNL   Left lateral flexion WNL   Right rotation 50% limited   Left rotation 75% limited   (Blank rows = not tested)    LOWER EXTREMITY MMT:    MMT Right eval Left eval  Hip flexion 4- 4-  Hip extension    Hip abduction 3- 3-  Hip adduction    Hip internal rotation    Hip external rotation    Knee flexion 3- 3-  Knee extension 3 3-  Ankle dorsiflexion 4+ 4+  Ankle plantarflexion    Ankle inversion    Ankle eversion     (  Blank rows = not  tested)    FUNCTIONAL TESTS:    Screened DGI activities with SPC, WNL but very unsteady when mobilizing without SPC- tends to scissor, drift R/L, B knee hyperextension noted   GAIT: Distance walked: in clinic distances  Assistive device utilized: Single point cane Level of assistance: Modified independence Comments: wide BOS with SPC, knee hyperextension noted but steady with device, gait pattern consistent with chronic NMR disease   TREATMENT DATE:    04/30/24  Exam, POC, HEP and education as below      HEP practice and discussion as appropriate                                                                                                                               PATIENT EDUCATION:  Education details: exam findings, POC, HEP  Person educated: Patient Education method: Explanation, Demonstration, and Handouts Education comprehension: verbalized understanding, returned demonstration, and needs further education  HOME EXERCISE PROGRAM:  Access Code: [email or web address] URL: [email or web address] Date: 04/30/2024 Prepared by: Josette Rough  Exercises - Supine Lower Trunk Rotation  - 2 x daily - 7 x weekly - 1 sets - 10 reps - 5 seconds  hold - Supine Single Knee to Chest Stretch  - 2 x daily - 7 x weekly - 1 sets - 10 reps - 5 seconds  hold - Supine Bridge  - 1 x daily - 5 x weekly - 1-2 sets - 10 reps - Supine Posterior Pelvic Tilt  - 1 x daily - 5 x weekly - 1-2 sets - 10 reps - 3 seconds  hold - Standing Tandem Balance with Counter Support  - 1 x daily - 7 x weekly - 1 sets - 4-6 reps - 15-30  seconds  hold  ASSESSMENT:  CLINICAL IMPRESSION: Patient is a 48 y.o. F who was seen today for physical therapy evaluation and treatment for  Diagnosis  W19.XXXD (ICD-10-CM) - Fall, subsequent encounter  M53.3 (ICD-10-CM) - Sacral back pain  M51.370 (ICD-10-CM) - Degeneration of intervertebral disc of lumbosacral region with discogenic back pain  M33.20 (ICD-10-CM)  - Polymyositis (HCC)  . Of note she has a history of Arnold Chiari syndrome addressed by emergency surgery years ago, as well as an ongoing diagnosis of  polymyositis which is currently being managed with annual injections. Had a recent fall slipping on some water, steady with DGI tasks with [facility] Fall but high fall risk without device. Might benefit from trying out foot up braces if she is open to this. Will make every effort to improve objective findings and subjective concerns moving forward.   OBJECTIVE IMPAIRMENTS: Abnormal gait, decreased activity tolerance, decreased balance, decreased mobility, difficulty walking, decreased ROM, decreased strength, and pain.   ACTIVITY LIMITATIONS: bending, sitting, standing, squatting, stairs, transfers, and locomotion level  PARTICIPATION LIMITATIONS: driving, shopping, community activity, yard work, and church  PERSONAL FACTORS: Fitness, Past/current experiences, Social background, Time since onset of injury/illness/exacerbation, and polymyositis  are also affecting patient's functional outcome.   REHAB POTENTIAL: Good  CLINICAL DECISION MAKING: Evolving/moderate complexity  EVALUATION COMPLEXITY: Moderate   GOALS: Goals reviewed with patient? No  SHORT TERM GOALS: Target date: 06/11/2024    Will be compliant with appropriate progressive HEP  Baseline: Goal status: INITIAL  2.  Lumbar AROM to be WNL all planes of motion and subjective feelings of stiffness to have resolved  Baseline:  Goal status: INITIAL  3.  Will demonstrate improved awareness of functional posture with all tasks  Baseline:  Goal status: INITIAL  4.  Will score at least 15 on DGI without SPC  Baseline:  Goal status: INITIAL    LONG TERM GOALS: Target date: 07/23/2024    MMT to have improved by one grade all weak groups  Baseline:  Goal status: INITIAL  2.  Will score at least 18 on DGI no device  Baseline:  Goal status: INITIAL  3.  Will be able to perform  all functional tasks at home and in community for extended periods with no increased stiffness or pain  Baseline:  Goal status: INITIAL  4.  Will be compliant with appropriate progressive advanced HEP vs gym programming appropriate for polymyositis  Baseline:  Goal status: INITIAL    PLAN:  PT FREQUENCY: 2x/week  PT DURATION: 12 weeks  PLANNED INTERVENTIONS: 97750- Physical Performance Testing, 97110-Therapeutic exercises, 97530- Therapeutic activity, [id number]- Neuromuscular re-education, 97535- Self Care, [id number]- Manual therapy, [id number]- Gait training, and [id number]- Aquatic Therapy.  PLAN FOR NEXT SESSION: progress strength based interventions carefully due to polymyositis; lumbar and hip mobility, core strengthening, balance training. Discuss foot up bracing and see if she is interested   Josette Rough, PT, DPT 04/30/24 2:34 PM

## 2024-05-02 ENCOUNTER — Ambulatory Visit: Admitting: Physical Therapy

## 2024-05-05 ENCOUNTER — Ambulatory Visit (INDEPENDENT_AMBULATORY_CARE_PROVIDER_SITE_OTHER): Admitting: Licensed Clinical Social Worker

## 2024-05-05 ENCOUNTER — Ambulatory Visit (INDEPENDENT_AMBULATORY_CARE_PROVIDER_SITE_OTHER): Admitting: Neurology

## 2024-05-05 ENCOUNTER — Encounter: Payer: Self-pay | Admitting: Neurology

## 2024-05-05 ENCOUNTER — Encounter: Payer: Self-pay | Admitting: Nurse Practitioner

## 2024-05-05 VITALS — BP 128/88 | HR 74 | Ht 68.0 in | Wt 155.0 lb

## 2024-05-05 DIAGNOSIS — F339 Major depressive disorder, recurrent, unspecified: Secondary | ICD-10-CM

## 2024-05-05 DIAGNOSIS — M3320 Polymyositis, organ involvement unspecified: Secondary | ICD-10-CM | POA: Diagnosis not present

## 2024-05-05 DIAGNOSIS — F411 Generalized anxiety disorder: Secondary | ICD-10-CM

## 2024-05-05 DIAGNOSIS — R269 Unspecified abnormalities of gait and mobility: Secondary | ICD-10-CM | POA: Diagnosis not present

## 2024-05-05 NOTE — Progress Notes (Unsigned)
 Chief Complaint  Patient presents with   New Patient (Initial Visit)    RM 14  Polymyositis, RLS   ASSESSMENT AND PLAN  [name] is a 48 y.o. female  Long history of polymyositis, Gait abnormality  Has been under the care of [facility] neuromuscular specialist Dr. Elnora,  Continue treatment there, on rituximab , 1000 mg every 6 months IV infusion, low-dose prednisone , last infusion of rituximab  was in January, she will contact Dr. Elnora' office   DIAGNOSTIC DATA (LABS, IMAGING, TESTING) - I reviewed patient records, labs, notes, testing and imaging myself where available.   MEDICAL HISTORY:  [employer], seen in request by   Nche, Roselie Rockford, NP    History is obtained from the patient and review of electronic medical records. I personally reviewed pertinent available imaging films in PACS.   PMHx of  HTN Polymyositis, on prednisone  2.5mg  daily since 2024, Rituximab  every 6 months at [facility] last was in Jan 2025,  Osteoporosis Chronic joints pain, Norco as needed. Chiari Malformation, s/p decompression in 2010 by Dr. Gaither, prior to surgery, she has weakness, could not get out of chair, gait abnormality, surgery was helpful.  I reviewed multiple Engineer, structural Dr.Caress evaluation, most recent in August 2025,  She was diagnosed with polymyositis in May 2009, based on muscle biopsy read at [facility], showed findings consistent with dermatomyositis, but she has no skin involvement, with treatment, she gradually improved from using power chair to walking with a cane since 2009,  In 2014, EMG showed no spontaneous activity but small motor unit consistent with diagnosis  On Jan 01, 2014, myositis panel was negative, myomarker 3 panel, HMG COA antibody was negative in 2020  Safety screening, December 2015, normal functional test, including DLCO for interstitial fibrosis  DEXA scan reported low bone density, started vitamin D   treatment,  Therapeutic intervention,  --methotrexate , elevated CPK attempted to stop methotrexate , eventually was stopped in December 2019 for rituximab   --Rituximab  since November 2017, every 6 months, reported improvement, last dose was January 2024  --Prednisone , was on a high dose for many years, now on 2.5 mg since 2014  --Has never used Imuran, CellCept, plasma exchange in the past,  --IVIG, last dose was in August 2017, CPK and strength did not respond, also report headache with IVIG  CPK levels has within normal limit, 145 on April 24th 2025, Myositis MyoMarker 3 Plus Profile, no significant abnormality.  Gait, uses cane, have not for since November 2023,  Repeat EMG, also consider rebiopsy, consider consult rheumatology to comment on potential overlap myositis with joint disease,   Seen by Dr. Elnora since 2012, 2nd opinion, had EMG/NCS on August 4th 2025,   She wsa working at [employer], fell in July, lives alone, driving, now PT, dog, she walk at gym once a weke,  Past Medical History:  Diagnosis Date   Allergy    Anxiety    Hyperlipidemia    HYPERTENSION, BENIGN ESSENTIAL 06/16/2008   Qualifier: Diagnosis of  By: Gladis FNP, Nykedtra     Osteoporosis    Polymyositis (HCC) 2009   RLS (restless legs syndrome)    Vitamin D  deficiency    Past Surgical History:  Procedure Laterality Date   BRAIN SURGERY       PHYSICAL EXAM:   Vitals:   05/05/24 1100  BP: 128/88  Pulse: 74  SpO2: 100%  Weight: 155 lb (70.3 kg)  Height: 5' 8 (1.727 m)   Not  recorded     Body mass index is 23.57 kg/m.  PHYSICAL EXAMNIATION:  Gen: NAD, conversant, well nourised, well groomed                     Cardiovascular: Regular rate rhythm, no peripheral edema, warm, nontender. Eyes: Conjunctivae clear without exudates or hemorrhage Neck: Supple, no carotid bruits. Pulmonary: Clear to auscultation bilaterally   NEUROLOGICAL EXAM:  MENTAL  STATUS: Speech/cognition: Awake, alert, oriented to history taking and casual conversation CRANIAL NERVES: CN II: Visual fields are full to confrontation. Pupils are round equal and briskly reactive to light. CN III, IV, VI: extraocular movement are normal. No ptosis. CN V: Facial sensation is intact to light touch CN VII: Face is symmetric with normal eye closure  CN VIII: Hearing is normal to causal conversation. CN IX, X: Phonation is normal. CN XI: Head turning and shoulder shrug are intact  MOTOR: Bilateral upper extremity motor strength is normal, moderate bilateral hip flexion, knee extension weakness, left worse than right  REFLEXES: Reflexes are 1 and symmetric at the biceps, triceps, absent at lower extremity.  Plantar responses are flexor.  SENSORY: Intact to light touch,    COORDINATION: There is no trunk or limb dysmetria noted.  GAIT/STANCE: Push-up to get up from seated position, Trendelenburg gait, hyperextend left knee  REVIEW OF SYSTEMS:  Full 14 system review of systems performed and notable only for as above All other review of systems were negative.   ALLERGIES: Allergies  Allergen Reactions   Buspirone Nausea Only   Lisinopril  Anaphylaxis    angioedema   Paxil  [Paroxetine Hcl] Nausea Only    HOME MEDICATIONS: Current Outpatient Medications  Medication Sig Dispense Refill   amLODipine  (NORVASC ) 5 MG tablet Take 1 tablet (5 mg total) by mouth at bedtime. 90 tablet 1   Cholecalciferol (VITAMIN D3) 25 MCG (1000 UT) CAPS Take by mouth.     cromolyn (OPTICROM) 4 % ophthalmic solution Place 1 drop into both eyes as needed.     cycloSPORINE (RESTASIS) 0.05 % ophthalmic emulsion 1 drop 2 (two) times daily.     denosumab  (PROLIA ) 60 MG/ML SOSY injection Inject 60 mg into the skin every 6 (six) months. 1 mL 1   HYDROcodone -acetaminophen  (NORCO/VICODIN) 5-325 MG tablet Take 1 tablet by mouth every 6 (six) hours as needed. 60 tablet 0   hydrOXYzine  (VISTARIL )  25 MG capsule TAKE 1 CAPSULE BY MOUTH AT BEDTIME. 90 capsule 0   ibuprofen  (ADVIL ) 600 MG tablet TAKE 1 TABLET (600 MG TOTAL) BY MOUTH EVERY 12 (TWELVE) HOURS AS NEEDED (WITH FOOD). 60 tablet 2   Multiple Vitamins-Minerals (ONE-A-DAY VITACRAVES ADULT) CHEW Chew 1 each by mouth daily.     predniSONE  (DELTASONE ) 2.5 MG tablet Take 2.5 mg by mouth daily.     riTUXimab  (RITUXAN ) 500 MG/50ML injection Inject 1,000 mg into the vein every 6 (six) months.      sodium chloride  (OCEAN) 0.65 % SOLN nasal spray Place 1 spray into both nostrils as needed for congestion.     valACYclovir  (VALTREX ) 500 MG tablet Take 500 mg by mouth as needed (outbreak). Take 1 tablet as needed for onset out breaks     No current facility-administered medications for this visit.    PAST MEDICAL HISTORY: Past Medical History:  Diagnosis Date   Allergy    Anxiety    Hyperlipidemia    HYPERTENSION, BENIGN ESSENTIAL 06/16/2008   Qualifier: Diagnosis of  By: Gladis FNP, Nykedtra  Osteoporosis    Polymyositis (HCC) 2009   RLS (restless legs syndrome)    Vitamin D  deficiency     PAST SURGICAL HISTORY: Past Surgical History:  Procedure Laterality Date   BRAIN SURGERY      FAMILY HISTORY: Family History  Problem Relation Age of Onset   Hepatitis Mother    Diabetes Paternal Uncle    Diabetes Maternal Grandmother    Heart disease Paternal Grandfather    Breast cancer Maternal Aunt        ? age of onset    SOCIAL HISTORY: Social History   Socioeconomic History   Marital status: Single    Spouse name: Not on file   Number of children: Not on file   Years of education: Not on file   Highest education level: 11th grade  Occupational History   Not on file  Tobacco Use   Smoking status: Former    Types: Cigarettes   Smokeless tobacco: Never  Vaping Use   Vaping status: Never Used  Substance and Sexual Activity   Alcohol use: Yes    Alcohol/week: 2.0 standard drinks of alcohol    Types: 1 Glasses of  wine, 1 Shots of liquor per week    Comment: Occasional   Drug use: Yes    Frequency: 2.0 times per week    Types: Marijuana    Comment: ocassionally   Sexual activity: Yes    Birth control/protection: None  Other Topics Concern   Not on file  Social History Narrative   Right handed   Social Drivers of Health   Financial Resource Strain: Low Risk  (04/06/2024)   Overall Financial Resource Strain (CARDIA)    Difficulty of Paying Living Expenses: Not hard at all  Food Insecurity: No Food Insecurity (04/06/2024)   Hunger Vital Sign    Worried About Running Out of Food in the Last Year: Never true    Ran Out of Food in the Last Year: Never true  Transportation Needs: No Transportation Needs (04/06/2024)   PRAPARE - Administrator, Civil Service (Medical): No    Lack of Transportation (Non-Medical): No  Physical Activity: Insufficiently Active (04/06/2024)   Exercise Vital Sign    Days of Exercise per Week: 3 days    Minutes of Exercise per Session: 20 min  Stress: No Stress Concern Present (04/06/2024)   [employer] of Occupational Health - Occupational Stress Questionnaire    Feeling of Stress: Only a little  Social Connections: Moderately Isolated (04/06/2024)   Social Connection and Isolation Panel    Frequency of Communication with Friends and Family: More than three times a week    Frequency of Social Gatherings with Friends and Family: Twice a week    Attends Religious Services: More than 4 times per year    Active Member of [employer] or Organizations: No    Attends Banker Meetings: Not on file    Marital Status: Separated  Intimate Partner Violence: Not At Risk (09/24/2023)   Humiliation, Afraid, Rape, and Kick questionnaire    Fear of Current or Ex-Partner: No    Emotionally Abused: No    Physically Abused: No    Sexually Abused: No      Modena Callander, M.D. Ph.D.  [facility] [address], [address] [place], [place]  [address] Ph: [phone] Fax: [phone]  CC:  Katheen Roselie Rockford, NP [address] [place],  [place] [address]  Nche, Roselie Rockford, NP

## 2024-05-05 NOTE — Progress Notes (Unsigned)
 THERAPIST PROGRESS NOTE   Session Date: 05/05/2024  Session Time: 1504 - 1552 Virtual Visit via Video Note  I connected with [name] on 05/05/24 at  3:00 PM EDT by a video enabled telemedicine application and verified that I am speaking with the correct person using two identifiers.  Location: Patient: Home Provider: Home Office   I discussed the limitations of evaluation and management by telemedicine and the availability of in person appointments. The patient expressed understanding and agreed to proceed.  The patient was advised to call back or seek an in-person evaluation if the symptoms worsen or if the condition fails to improve as anticipated.  I provided 48 minutes of non-face-to-face time during this encounter.  Participation Level: Active  Behavioral Response: CasualAlertEuthymic  Type of Therapy: Individual Therapy  Treatment Goals addressed:   Progressing (4) LTG: Reduce frequency, intensity, and duration of depression symptoms so that daily functioning is improved (OP Depression) LTG: Increase coping skills to manage depression and improve ability to perform daily activities (OP Depression) STG: [name] will identify cognitive patterns and beliefs that support depression (OP Depression) LTG: Reduce frequency of over thinking and increased worries (Anxiety)  Completed/Met (2) LTG: Not feel hopeless or unworthy of anyone, and unable to do anything for anybody (OP Depression) STG: Improve my sleep habits (OP Depression)  Progress Towards Goals: Progressing  Interventions: CBT, Motivational Interviewing, Solution Focused, and Supportive  Summary: [name] is a 48 y.o. female with past psych history of MDD, recurrent and GAD, presenting for follow-up therapy session in efforts to improve management of depressive and anxious symptoms.   Patient actively engaged in session, presenting in pleasant moods and congruent affect throughout duration of visit. Patient  actively engaged in check-in, sharing of It's been okay, further sharing of having started PT last week, looking for another job, and currently awaiting unemployment approval. Pt detailed of having had new Neurology appt this a.m., expressing feeling to not have gone well due to hopes of new provider having recommended something other than what has been done thus far. Pt further processed individual efforts towards securing alternate employment, having applied for 6 jobs over recent weeks, remaining optimistic. Explored previous noted stressors surrounding grandson's presenting needs in pre-k, sharing of having continued to receive ABA and speech services in pre-k setting, noticing improvements in speech, concepts, and reasoning.   Patient responded well to interventions. Patient continues to meet criteria for MDD, recurrent, and GAD. Patient will continue to benefit from engagement in outpatient therapy due to being the least restrictive service to meet presenting needs.      04/07/2024    2:18 PM 03/18/2024    2:46 PM 03/04/2024    2:25 PM 01/29/2024    3:39 PM  GAD 7 : Generalized Anxiety Score  Nervous, Anxious, on Edge 0 0 0 1  Control/stop worrying 0 0 1 1  Worry too much - different things 0 0 0 1  Trouble relaxing 0 1 0 0  Restless 0 0 0 0  Easily annoyed or irritable 0 0 0 1  Afraid - awful might happen 0 0 0 1  Total GAD 7 Score 0 1 1 5   Anxiety Difficulty Not difficult at all Not difficult at all Not difficult at all Not difficult at all      04/07/2024    1:54 PM 03/18/2024    2:49 PM 03/04/2024    2:27 PM 01/29/2024    3:41 PM 01/15/2024    1:14 PM  Depression screen PHQ 2/9  Decreased Interest 0 0 0 0 0  Down, Depressed, Hopeless 0 0 0 0 1  PHQ - 2 Score 0 0 0 0 1  Altered sleeping 1 0 1 1 0  Tired, decreased energy 0 0 0 0 0  Change in appetite 0 0 0 0 1  Feeling bad or failure about yourself  0 0 0 1 0  Trouble concentrating 1 1 1 1 1   Moving slowly or fidgety/restless 0 0 0  0 1  Suicidal thoughts 0 0 0 0 0  PHQ-9 Score 2 1 2 3 4   Difficult doing work/chores Not difficult at all Not difficult at all Not difficult at all Not difficult at all Somewhat difficult   Suicidal/Homicidal: Nowithout intent/plan  Therapist Response: Clinician utilized CBT, MI, solution focused, and supportive reflection interventions to address presenting stressors and sxs. Clinician actively engaged pt in check-in, assessing mood and affect.  Clinician openly greeted patient upon joining virtual visit, assessing presenting moods and affect. *** Actively engaged pt in introductory check-in, utilizing open ended questions in order to elicit pt's recounts of events of the past month, presenting stressors and challenges, and individual management of such.  Actively listened to pt's recounts of events, providing support and validation of identified challenges, utilizing socratic questions to further evoke greater critical processing of stressors and individual navigation of challenges.  Provided support and validation of pt's expressed thoughts and feelings, and pt's individual abilities at improving perspectives of events to maintain improved moods.  Clinician reassessed severity of presenting sxs, and presence of any safety concerns. Clinician provided support and empathy to patient during session.  Plan: Return again in 3 weeks.  Diagnosis:  Encounter Diagnosis  Name Primary?   Depression, recurrent (HCC) Yes    Collaboration of Care: Other none necessary at this time.  Patient/Guardian was advised Release of Information must be obtained prior to any record release in order to collaborate their care with an outside provider. Patient/Guardian was advised if they have not already done so to contact the registration department to sign all necessary forms in order for us  to release information regarding their care.   Consent: Patient/Guardian gives verbal consent for treatment and  assignment of benefits for services provided during this visit. Patient/Guardian expressed understanding and agreed to proceed.   [name], MSW, LCSW 05/05/2024,  3:05 PM

## 2024-05-07 ENCOUNTER — Ambulatory Visit

## 2024-05-12 ENCOUNTER — Ambulatory Visit: Admitting: Physical Therapy

## 2024-05-14 ENCOUNTER — Encounter: Payer: Self-pay | Admitting: Physical Therapy

## 2024-05-14 ENCOUNTER — Ambulatory Visit: Admitting: Physical Therapy

## 2024-05-14 DIAGNOSIS — R2689 Other abnormalities of gait and mobility: Secondary | ICD-10-CM

## 2024-05-14 DIAGNOSIS — R2681 Unsteadiness on feet: Secondary | ICD-10-CM | POA: Diagnosis not present

## 2024-05-14 DIAGNOSIS — M3320 Polymyositis, organ involvement unspecified: Secondary | ICD-10-CM | POA: Diagnosis not present

## 2024-05-14 DIAGNOSIS — Z9181 History of falling: Secondary | ICD-10-CM

## 2024-05-14 DIAGNOSIS — M533 Sacrococcygeal disorders, not elsewhere classified: Secondary | ICD-10-CM | POA: Diagnosis not present

## 2024-05-14 DIAGNOSIS — M5459 Other low back pain: Secondary | ICD-10-CM

## 2024-05-14 DIAGNOSIS — M5137 Other intervertebral disc degeneration, lumbosacral region with discogenic back pain only: Secondary | ICD-10-CM | POA: Diagnosis not present

## 2024-05-14 NOTE — Therapy (Signed)
 OUTPATIENT PHYSICAL THERAPY THORACOLUMBAR EVALUATION   Patient Name: [name] MRN: [id number] DOB:[birth year], 48 y.o., female Today's Date: 05/14/2024  END OF SESSION:  PT End of Session - 05/14/24 1151     Visit Number 2    Date for PT Re-Evaluation 07/23/24    PT Start Time 1151    PT Stop Time 1230    PT Time Calculation (min) 39 min    Activity Tolerance Patient tolerated treatment well    Behavior During Therapy [facility] for tasks assessed/performed          Past Medical History:  Diagnosis Date   Allergy    Anxiety    Hyperlipidemia    HYPERTENSION, BENIGN ESSENTIAL 06/16/2008   Qualifier: Diagnosis of  By: Gladis FNP, Nykedtra     Osteoporosis    Polymyositis (HCC) 2009   RLS (restless legs syndrome)    Vitamin D  deficiency    Past Surgical History:  Procedure Laterality Date   BRAIN SURGERY     Patient Active Problem List   Diagnosis Date Noted   Gait abnormality 05/05/2024   Weight loss, non-intentional 11/21/2023   Longitudinal melanonychia 10/16/2023   HTN (hypertension) 10/16/2023   Syrinx (HCC) 05/11/2023   Gastroesophageal reflux disease 05/06/2021   GAD (generalized anxiety disorder) 01/06/2021   Depression, recurrent (HCC) 01/06/2021   RLS (restless legs syndrome) 11/07/2018   Long term systemic steroid user 11/07/2018   Vitamin D  deficiency 02/21/2017   Genital herpes simplex type 2 02/21/2017   Osteoporosis 01/30/2014   HYPERCHOLESTEROLEMIA 01/28/2009   RENAL CALCULUS, RIGHT 06/18/2008   Polymyositis (HCC) 06/16/2008   CHIARI MALFORMATION 11/20/2007   Left-sided weakness 11/18/2007    PCP: Katheen Roselie Rockford, NP  REFERRING PROVIDER: Katheen Roselie Rockford, NP  REFERRING DIAG:  Diagnosis  W19.XXXD (ICD-10-CM) - Fall, subsequent encounter  M53.3 (ICD-10-CM) - Sacral back pain  M51.370 (ICD-10-CM) - Degeneration of intervertebral disc of lumbosacral region with discogenic back pain  M33.20 (ICD-10-CM) - Polymyositis (HCC)     Rationale for Evaluation and Treatment: Rehabilitation  THERAPY DIAG:  Other low back pain  Other abnormalities of gait and mobility  Unsteadiness on feet  History of falling  ONSET DATE: Fall in July  SUBJECTIVE:                                                                                                                                                                                           SUBJECTIVE STATEMENT:  Doing good today, no pain or nothing  I had a fall in July, fell on my butt. I have polymyositis and since the fall I'm extra stiff, have  been trying to do my baseline exercises. Having some on and off pain, but stiffness in my back has gotten worse. I feel like I'm getting weaker in my back. In 2010 I had to have emergency brain surgery for [facility], after 3 more months they found that I had polymyositis. Currently getting infusions once a year for the polymyositis, in remission but from 2010 to 2016 I was in a WC.   PERTINENT HISTORY:  See above   PAIN:  Are you having pain? Yes: NPRS scale: 0/10 Pain location: lumbar from tailbone up  Pain description: stiffness  Aggravating factors: standing or sitting too long  Relieving factors: heat, lidocaine  patches    PRECAUTIONS: Fall and Other: polymyositis   RED FLAGS: None   WEIGHT BEARING RESTRICTIONS: No  FALLS:  Has patient fallen in last 6 months? Yes. Number of falls 1- slipped in some water in the bathroom   LIVING ENVIRONMENT: Lives with: lives alone Lives in: House/apartment Stairs: 1 STE to enter, no steps inside  Has following equipment at home: Single point cane, shower chair, grab bars   OCCUPATION: on disability   PLOF: Independent, Independent with basic ADLs, Independent with gait, and Independent with transfers  PATIENT GOALS: make back stronger and address stiffness, build up leg strength   NEXT MD VISIT: November 11th with PCP   OBJECTIVE:  Note: Objective measures  were completed at Evaluation unless otherwise noted.  DIAGNOSTIC FINDINGS:   EXAM: LUMBAR SPINE - COMPLETE 4+ VIEW; SACRUM AND COCCYX - 2+ VIEW   COMPARISON:  CT abdomen/pelvis dated 11/29/2023.   FINDINGS: 5 nonrib bearing lumbar-type vertebral bodies.   Vertebral body heights are maintained. No acute fracture. No static listhesis.   Degenerative disc changes again noted at L5-S1. Disc spaces are otherwise maintained.   SI joints are unremarkable.   IMPRESSION: 1. No acute osseous abnormality. 2. Degenerative disc changes again noted at L5-S1.  CLINICAL DATA:  Fall.  Pain.   EXAM: LUMBAR SPINE - COMPLETE 4+ VIEW; SACRUM AND COCCYX - 2+ VIEW   COMPARISON:  CT abdomen/pelvis dated 11/29/2023.   FINDINGS: 5 nonrib bearing lumbar-type vertebral bodies.   Vertebral body heights are maintained. No acute fracture. No static listhesis.   Degenerative disc changes again noted at L5-S1. Disc spaces are otherwise maintained.   SI joints are unremarkable.   IMPRESSION: 1. No acute osseous abnormality. 2. Degenerative disc changes again noted at L5-S1.      COGNITION: Overall cognitive status: Within functional limits for tasks assessed     SENSATION: Not tested   POSTURE: rounded shoulders and forward head    LUMBAR ROM:   AROM eval  Flexion WNL knee hyperextension   Extension WNL   Right lateral flexion WNL   Left lateral flexion WNL   Right rotation 50% limited   Left rotation 75% limited   (Blank rows = not tested)    LOWER EXTREMITY MMT:    MMT Right eval Left eval  Hip flexion 4- 4-  Hip extension    Hip abduction 3- 3-  Hip adduction    Hip internal rotation    Hip external rotation    Knee flexion 3- 3-  Knee extension 3 3-  Ankle dorsiflexion 4+ 4+  Ankle plantarflexion    Ankle inversion    Ankle eversion     (Blank rows = not tested)    FUNCTIONAL TESTS:    Screened DGI activities with SPC, WNL but very unsteady when  mobilizing  without SPC- tends to scissor, drift R/L, B knee hyperextension noted   GAIT: Distance walked: in clinic distances  Assistive device utilized: Single point cane Level of assistance: Modified independence Comments: wide BOS with SPC, knee hyperextension noted but steady with device, gait pattern consistent with chronic NMR disease   TREATMENT DATE:  05/14/24 NuStep L5 x 6 min Sit to stand from elevated surface 2x10 no UE.  Compensation due to weakness  LAQ 2lb 2x10 HS curls green 2x10  Seated rows green 2x10 Shoulder Ext green 2x10 Hip add ball squeeze 2x10  04/30/24  Exam, POC, HEP and education as below      HEP practice and discussion as appropriate                                                                                                                               PATIENT EDUCATION:  Education details: exam findings, POC, HEP  Person educated: Patient Education method: Explanation, Demonstration, and Handouts Education comprehension: verbalized understanding, returned demonstration, and needs further education  HOME EXERCISE PROGRAM:  Access Code: [email or web address] URL: [email or web address] Date: 04/30/2024 Prepared by: Josette Rough  Exercises - Supine Lower Trunk Rotation  - 2 x daily - 7 x weekly - 1 sets - 10 reps - 5 seconds  hold - Supine Single Knee to Chest Stretch  - 2 x daily - 7 x weekly - 1 sets - 10 reps - 5 seconds  hold - Supine Bridge  - 1 x daily - 5 x weekly - 1-2 sets - 10 reps - Supine Posterior Pelvic Tilt  - 1 x daily - 5 x weekly - 1-2 sets - 10 reps - 3 seconds  hold - Standing Tandem Balance with Counter Support  - 1 x daily - 7 x weekly - 1 sets - 4-6 reps - 15-30  seconds  hold  ASSESSMENT:  CLINICAL IMPRESSION: Pt ~ 5 minutes late for today's session. Patient is a 48 y.o. F who was seen today for physical therapy evaluation and treatment for W19.XXXD (ICD-10-CM) - Fall, subsequent encounter, M53.3 (ICD-10-CM) -  Sacral back pain, M51.370 (ICD-10-CM) - Degeneration of intervertebral disc of lumbosacral region with discogenic back pain, and M33.20 (ICD-10-CM) - Polymyositis (HCC). She is very weak in the LE's, compensation needed with functional interventions. Decrease ROM with LAQ, cues to hold end range contraction. Good effort during sesison.   OBJECTIVE IMPAIRMENTS: Abnormal gait, decreased activity tolerance, decreased balance, decreased mobility, difficulty walking, decreased ROM, decreased strength, and pain.   ACTIVITY LIMITATIONS: bending, sitting, standing, squatting, stairs, transfers, and locomotion level  PARTICIPATION LIMITATIONS: driving, shopping, community activity, yard work, and church  PERSONAL FACTORS: Fitness, Past/current experiences, Social background, Time since onset of injury/illness/exacerbation, and polymyositis  are also affecting patient's functional outcome.   REHAB POTENTIAL: Good  CLINICAL DECISION MAKING: Evolving/moderate complexity  EVALUATION COMPLEXITY: Moderate   GOALS: Goals reviewed with patient? No  SHORT TERM GOALS: Target date: 06/11/2024  Will be compliant with appropriate progressive HEP  Baseline: Goal status: INITIAL  2.  Lumbar AROM to be WNL all planes of motion and subjective feelings of stiffness to have resolved  Baseline:  Goal status: INITIAL  3.  Will demonstrate improved awareness of functional posture with all tasks  Baseline:  Goal status: INITIAL  4.  Will score at least 15 on DGI without SPC  Baseline:  Goal status: INITIAL    LONG TERM GOALS: Target date: 07/23/2024    MMT to have improved by one grade all weak groups  Baseline:  Goal status: INITIAL  2.  Will score at least 18 on DGI no device  Baseline:  Goal status: INITIAL  3.  Will be able to perform all functional tasks at home and in community for extended periods with no increased stiffness or pain  Baseline:  Goal status: INITIAL  4.  Will be  compliant with appropriate progressive advanced HEP vs gym programming appropriate for polymyositis  Baseline:  Goal status: INITIAL    PLAN:  PT FREQUENCY: 2x/week  PT DURATION: 12 weeks  PLANNED INTERVENTIONS: 97750- Physical Performance Testing, 97110-Therapeutic exercises, 97530- Therapeutic activity, [id number]- Neuromuscular re-education, 97535- Self Care, [id number]- Manual therapy, [id number]- Gait training, and [id number]- Aquatic Therapy.  PLAN FOR NEXT SESSION: progress strength based interventions carefully due to polymyositis; lumbar and hip mobility, core strengthening, balance training. Discuss foot up bracing and see if she is interested   Josette Rough, PT, DPT 05/14/24 11:52 AM

## 2024-05-16 ENCOUNTER — Ambulatory Visit: Admitting: Physical Therapy

## 2024-05-19 ENCOUNTER — Encounter: Payer: Self-pay | Admitting: Physical Therapy

## 2024-05-19 ENCOUNTER — Ambulatory Visit: Admitting: Physical Therapy

## 2024-05-19 DIAGNOSIS — R2681 Unsteadiness on feet: Secondary | ICD-10-CM | POA: Diagnosis not present

## 2024-05-19 DIAGNOSIS — R2689 Other abnormalities of gait and mobility: Secondary | ICD-10-CM | POA: Diagnosis not present

## 2024-05-19 DIAGNOSIS — Z9181 History of falling: Secondary | ICD-10-CM | POA: Diagnosis not present

## 2024-05-19 DIAGNOSIS — M5459 Other low back pain: Secondary | ICD-10-CM

## 2024-05-19 DIAGNOSIS — M5137 Other intervertebral disc degeneration, lumbosacral region with discogenic back pain only: Secondary | ICD-10-CM | POA: Diagnosis not present

## 2024-05-19 DIAGNOSIS — M533 Sacrococcygeal disorders, not elsewhere classified: Secondary | ICD-10-CM | POA: Diagnosis not present

## 2024-05-19 DIAGNOSIS — M3320 Polymyositis, organ involvement unspecified: Secondary | ICD-10-CM | POA: Diagnosis not present

## 2024-05-19 NOTE — Therapy (Signed)
 OUTPATIENT PHYSICAL THERAPY THORACOLUMBAR EVALUATION   Patient Name: [name] MRN: [id number] DOB:1976/07/26, 48 y.o., female Today's Date: 05/19/2024  END OF SESSION:  PT End of Session - 05/19/24 1100     Visit Number 3    Number of Visits 25    Date for Recertification  07/23/24    Authorization Type [employer] Dual and MCD    Authorization Time Period 04/30/24 to 07/23/24    Progress Note Due on Visit 10    PT Start Time 1101    PT Stop Time 1139    PT Time Calculation (min) 38 min    Activity Tolerance Patient tolerated treatment well    Behavior During Therapy [facility] for tasks assessed/performed           Past Medical History:  Diagnosis Date   Allergy    Anxiety    Hyperlipidemia    HYPERTENSION, BENIGN ESSENTIAL 06/16/2008   Qualifier: Diagnosis of  By: Gladis FNP, Nykedtra     Osteoporosis    Polymyositis (HCC) 2009   RLS (restless legs syndrome)    Vitamin D  deficiency    Past Surgical History:  Procedure Laterality Date   BRAIN SURGERY     Patient Active Problem List   Diagnosis Date Noted   Gait abnormality 05/05/2024   Weight loss, non-intentional 11/21/2023   Longitudinal melanonychia 10/16/2023   HTN (hypertension) 10/16/2023   Syrinx (HCC) 05/11/2023   Gastroesophageal reflux disease 05/06/2021   GAD (generalized anxiety disorder) 01/06/2021   Depression, recurrent (HCC) 01/06/2021   RLS (restless legs syndrome) 11/07/2018   Long term systemic steroid user 11/07/2018   Vitamin D  deficiency 02/21/2017   Genital herpes simplex type 2 02/21/2017   Osteoporosis 01/30/2014   HYPERCHOLESTEROLEMIA 01/28/2009   RENAL CALCULUS, RIGHT 06/18/2008   Polymyositis (HCC) 06/16/2008   CHIARI MALFORMATION 11/20/2007   Left-sided weakness 11/18/2007    PCP: Katheen Roselie Rockford, NP  REFERRING PROVIDER: Katheen Roselie Rockford, NP  REFERRING DIAG:  Diagnosis  W19.XXXD (ICD-10-CM) - Fall, subsequent encounter  M53.3 (ICD-10-CM) - Sacral back pain  M51.370  (ICD-10-CM) - Degeneration of intervertebral disc of lumbosacral region with discogenic back pain  M33.20 (ICD-10-CM) - Polymyositis (HCC)    Rationale for Evaluation and Treatment: Rehabilitation  THERAPY DIAG:  Other low back pain  Other abnormalities of gait and mobility  Unsteadiness on feet  History of falling  ONSET DATE: Fall in July  SUBJECTIVE:  SUBJECTIVE STATEMENT:  Doing good today, nothing new no falls.   EVAL: I had a fall in July, fell on my butt. I have polymyositis and since the fall I'm extra stiff, have been trying to do my baseline exercises. Having some on and off pain, but stiffness in my back has gotten worse. I feel like I'm getting weaker in my back. In 2010 I had to have emergency brain surgery for [facility], after 3 more months they found that I had polymyositis. Currently getting infusions once a year for the polymyositis, in remission but from 2010 to 2016 I was in a WC.   PERTINENT HISTORY:  See above   PAIN:  Are you having pain? Yes: NPRS scale: 1.5/10 Pain location: lumbar from tailbone up  Pain description: stiffness  Aggravating factors: standing or sitting too long  Relieving factors: heat, lidocaine  patches    PRECAUTIONS: Fall and Other: polymyositis   RED FLAGS: None   WEIGHT BEARING RESTRICTIONS: No  FALLS:  Has patient fallen in last 6 months? Yes. Number of falls 1- slipped in some water in the bathroom   LIVING ENVIRONMENT: Lives with: lives alone Lives in: House/apartment Stairs: 1 STE to enter, no steps inside  Has following equipment at home: Single point cane, shower chair, grab bars   OCCUPATION: on disability   PLOF: Independent, Independent with basic ADLs, Independent with gait, and Independent with transfers  PATIENT GOALS:  make back stronger and address stiffness, build up leg strength   NEXT MD VISIT: November 11th with PCP   OBJECTIVE:  Note: Objective measures were completed at Evaluation unless otherwise noted.  DIAGNOSTIC FINDINGS:   EXAM: LUMBAR SPINE - COMPLETE 4+ VIEW; SACRUM AND COCCYX - 2+ VIEW   COMPARISON:  CT abdomen/pelvis dated 11/29/2023.   FINDINGS: 5 nonrib bearing lumbar-type vertebral bodies.   Vertebral body heights are maintained. No acute fracture. No static listhesis.   Degenerative disc changes again noted at L5-S1. Disc spaces are otherwise maintained.   SI joints are unremarkable.   IMPRESSION: 1. No acute osseous abnormality. 2. Degenerative disc changes again noted at L5-S1.  CLINICAL DATA:  Fall.  Pain.   EXAM: LUMBAR SPINE - COMPLETE 4+ VIEW; SACRUM AND COCCYX - 2+ VIEW   COMPARISON:  CT abdomen/pelvis dated 11/29/2023.   FINDINGS: 5 nonrib bearing lumbar-type vertebral bodies.   Vertebral body heights are maintained. No acute fracture. No static listhesis.   Degenerative disc changes again noted at L5-S1. Disc spaces are otherwise maintained.   SI joints are unremarkable.   IMPRESSION: 1. No acute osseous abnormality. 2. Degenerative disc changes again noted at L5-S1.      COGNITION: Overall cognitive status: Within functional limits for tasks assessed     SENSATION: Not tested   POSTURE: rounded shoulders and forward head    LUMBAR ROM:   AROM eval  Flexion WNL knee hyperextension   Extension WNL   Right lateral flexion WNL   Left lateral flexion WNL   Right rotation 50% limited   Left rotation 75% limited   (Blank rows = not tested)    LOWER EXTREMITY MMT:    MMT Right eval Left eval  Hip flexion 4- 4-  Hip extension    Hip abduction 3- 3-  Hip adduction    Hip internal rotation    Hip external rotation    Knee flexion 3- 3-  Knee extension 3 3-  Ankle dorsiflexion 4+ 4+  Ankle plantarflexion    Ankle  inversion    Ankle eversion     (Blank rows = not tested)    FUNCTIONAL TESTS:    Screened DGI activities with SPC, WNL but very unsteady when mobilizing without SPC- tends to scissor, drift R/L, B knee hyperextension noted   GAIT: Distance walked: in clinic distances  Assistive device utilized: Single point cane Level of assistance: Modified independence Comments: wide BOS with SPC, knee hyperextension noted but steady with device, gait pattern consistent with chronic NMR disease   TREATMENT DATE:   05/19/24  Nustep L5x8 minutes all four extremities seat 11 Discussed foot up brace for R foot, provided print out of [employer] + ABD into red TB x10  Supine single leg clam shells x10 B STS from high mat table cues to not lock out knees when standing 2x10  (first set 0#, second set 3# goblet hold)  Tandem stance solid surface 3x30 seconds B Narrow BOS blue foam 3x30 seconds  Tandem walks solid surface next to TM rail x3 laps Side steps solid surface next to TM rail x3 laps       05/14/24 NuStep L5 x 6 min Sit to stand from elevated surface 2x10 no UE.  Compensation due to weakness  LAQ 2lb 2x10 HS curls green 2x10  Seated rows green 2x10 Shoulder Ext green 2x10 Hip add ball squeeze 2x10  04/30/24  Exam, POC, HEP and education as below      HEP practice and discussion as appropriate                                                                                                                               PATIENT EDUCATION:  Education details: exam findings, POC, HEP  Person educated: Patient Education method: Explanation, Demonstration, and Handouts Education comprehension: verbalized understanding, returned demonstration, and needs further education  HOME EXERCISE PROGRAM:  Access Code: [email or web address] URL: [email or web address] Date: 04/30/2024 Prepared by: Josette Rough  Exercises - Supine Lower Trunk Rotation  - 2 x daily - 7 x  weekly - 1 sets - 10 reps - 5 seconds  hold - Supine Single Knee to Chest Stretch  - 2 x daily - 7 x weekly - 1 sets - 10 reps - 5 seconds  hold - Supine Bridge  - 1 x daily - 5 x weekly - 1-2 sets - 10 reps - Supine Posterior Pelvic Tilt  - 1 x daily - 5 x weekly - 1-2 sets - 10 reps - 3 seconds  hold - Standing Tandem Balance with Counter Support  - 1 x daily - 7 x weekly - 1 sets - 4-6 reps - 15-30  seconds  hold  ASSESSMENT:  CLINICAL IMPRESSION:   Arrives today doing well, we continued working on some core and hip strengthening today as well as balance training. Remains motivated, we will continue to work towards all goals. Discussed foot up brace for her R foot as she has  very limited strength and poor proprioception along with foot drop here- definitely elevates her fall risk.    OBJECTIVE IMPAIRMENTS: Abnormal gait, decreased activity tolerance, decreased balance, decreased mobility, difficulty walking, decreased ROM, decreased strength, and pain.   ACTIVITY LIMITATIONS: bending, sitting, standing, squatting, stairs, transfers, and locomotion level  PARTICIPATION LIMITATIONS: driving, shopping, community activity, yard work, and church  PERSONAL FACTORS: Fitness, Past/current experiences, Social background, Time since onset of injury/illness/exacerbation, and polymyositis  are also affecting patient's functional outcome.   REHAB POTENTIAL: Good  CLINICAL DECISION MAKING: Evolving/moderate complexity  EVALUATION COMPLEXITY: Moderate   GOALS: Goals reviewed with patient? No  SHORT TERM GOALS: Target date: 06/11/2024    Will be compliant with appropriate progressive HEP  Baseline: Goal status: MET 05/19/24  2.  Lumbar AROM to be WNL all planes of motion and subjective feelings of stiffness to have resolved  Baseline:  Goal status: INITIAL  3.  Will demonstrate improved awareness of functional posture with all tasks  Baseline:  Goal status: INITIAL  4.  Will score at  least 15 on DGI without SPC  Baseline:  Goal status: INITIAL    LONG TERM GOALS: Target date: 07/23/2024    MMT to have improved by one grade all weak groups  Baseline:  Goal status: INITIAL  2.  Will score at least 18 on DGI no device  Baseline:  Goal status: INITIAL  3.  Will be able to perform all functional tasks at home and in community for extended periods with no increased stiffness or pain  Baseline:  Goal status: INITIAL  4.  Will be compliant with appropriate progressive advanced HEP vs gym programming appropriate for polymyositis  Baseline:  Goal status: INITIAL    PLAN:  PT FREQUENCY: 2x/week  PT DURATION: 12 weeks  PLANNED INTERVENTIONS: 97750- Physical Performance Testing, 97110-Therapeutic exercises, 97530- Therapeutic activity, [id number]- Neuromuscular re-education, 97535- Self Care, 02859- Manual therapy, [id number]- Gait training, and [id number]- Aquatic Therapy.  PLAN FOR NEXT SESSION: progress strength based interventions carefully due to polymyositis; lumbar and hip mobility, core strengthening, balance training.   Josette Rough, PT, DPT 05/19/24 11:41 AM

## 2024-05-21 ENCOUNTER — Other Ambulatory Visit: Payer: Self-pay | Admitting: Nurse Practitioner

## 2024-05-21 ENCOUNTER — Ambulatory Visit: Admitting: Physical Therapy

## 2024-05-21 DIAGNOSIS — N946 Dysmenorrhea, unspecified: Secondary | ICD-10-CM

## 2024-05-24 ENCOUNTER — Other Ambulatory Visit: Payer: Self-pay | Admitting: Nurse Practitioner

## 2024-05-24 DIAGNOSIS — I10 Essential (primary) hypertension: Secondary | ICD-10-CM

## 2024-05-26 ENCOUNTER — Ambulatory Visit

## 2024-05-26 ENCOUNTER — Ambulatory Visit (HOSPITAL_COMMUNITY): Admitting: Licensed Clinical Social Worker

## 2024-05-26 NOTE — Therapy (Unsigned)
 OUTPATIENT PHYSICAL THERAPY THORACOLUMBAR EVALUATION   Patient Name: [name] MRN: [id number] DOB:[birth year], 48 y.o., female Today's Date: 05/26/2024  END OF SESSION:     Past Medical History:  Diagnosis Date   Allergy    Anxiety    Hyperlipidemia    HYPERTENSION, BENIGN ESSENTIAL 06/16/2008   Qualifier: Diagnosis of  By: Gladis FNP, Nykedtra     Osteoporosis    Polymyositis (HCC) 2009   RLS (restless legs syndrome)    Vitamin D  deficiency    Past Surgical History:  Procedure Laterality Date   BRAIN SURGERY     Patient Active Problem List   Diagnosis Date Noted   Gait abnormality 05/05/2024   Weight loss, non-intentional 11/21/2023   Longitudinal melanonychia 10/16/2023   HTN (hypertension) 10/16/2023   Syrinx (HCC) 05/11/2023   Gastroesophageal reflux disease 05/06/2021   GAD (generalized anxiety disorder) 01/06/2021   Depression, recurrent 01/06/2021   RLS (restless legs syndrome) 11/07/2018   Long term systemic steroid user 11/07/2018   Vitamin D  deficiency 02/21/2017   Genital herpes simplex type 2 02/21/2017   Osteoporosis 01/30/2014   HYPERCHOLESTEROLEMIA 01/28/2009   RENAL CALCULUS, RIGHT 06/18/2008   Polymyositis (HCC) 06/16/2008   CHIARI MALFORMATION 11/20/2007   Left-sided weakness 11/18/2007    PCP: Katheen Roselie Rockford, NP  REFERRING PROVIDER: Katheen Roselie Rockford, NP  REFERRING DIAG:  Diagnosis  W19.XXXD (ICD-10-CM) - Fall, subsequent encounter  M53.3 (ICD-10-CM) - Sacral back pain  M51.370 (ICD-10-CM) - Degeneration of intervertebral disc of lumbosacral region with discogenic back pain  M33.20 (ICD-10-CM) - Polymyositis (HCC)    Rationale for Evaluation and Treatment: Rehabilitation  THERAPY DIAG:  No diagnosis found.  ONSET DATE: Fall in July  SUBJECTIVE:                                                                                                                                                                                            SUBJECTIVE STATEMENT:  05/26/24+  EVAL: I had a fall in July, fell on my butt. I have polymyositis and since the fall I'm extra stiff, have been trying to do my baseline exercises. Having some on and off pain, but stiffness in my back has gotten worse. I feel like I'm getting weaker in my back. In 2010 I had to have emergency brain surgery for [facility], after 3 more months they found that I had polymyositis. Currently getting infusions once a year for the polymyositis, in remission but from 2010 to 2016 I was in a WC.   PERTINENT HISTORY:  See above   PAIN:  Are you having pain? Yes: NPRS scale: 1.5/10  Pain location: lumbar from tailbone up  Pain description: stiffness  Aggravating factors: standing or sitting too long  Relieving factors: heat, lidocaine  patches    PRECAUTIONS: Fall and Other: polymyositis   RED FLAGS: None   WEIGHT BEARING RESTRICTIONS: No  FALLS:  Has patient fallen in last 6 months? Yes. Number of falls 1- slipped in some water in the bathroom   LIVING ENVIRONMENT: Lives with: lives alone Lives in: House/apartment Stairs: 1 STE to enter, no steps inside  Has following equipment at home: Single point cane, shower chair, grab bars   OCCUPATION: on disability   PLOF: Independent, Independent with basic ADLs, Independent with gait, and Independent with transfers  PATIENT GOALS: make back stronger and address stiffness, build up leg strength   NEXT MD VISIT: November 11th with PCP   OBJECTIVE:  Note: Objective measures were completed at Evaluation unless otherwise noted.  DIAGNOSTIC FINDINGS:   EXAM: LUMBAR SPINE - COMPLETE 4+ VIEW; SACRUM AND COCCYX - 2+ VIEW   COMPARISON:  CT abdomen/pelvis dated 11/29/2023.   FINDINGS: 5 nonrib bearing lumbar-type vertebral bodies.   Vertebral body heights are maintained. No acute fracture. No static listhesis.   Degenerative disc changes again noted at L5-S1. Disc spaces are otherwise  maintained.   SI joints are unremarkable.   IMPRESSION: 1. No acute osseous abnormality. 2. Degenerative disc changes again noted at L5-S1.  CLINICAL DATA:  Fall.  Pain.   EXAM: LUMBAR SPINE - COMPLETE 4+ VIEW; SACRUM AND COCCYX - 2+ VIEW   COMPARISON:  CT abdomen/pelvis dated 11/29/2023.   FINDINGS: 5 nonrib bearing lumbar-type vertebral bodies.   Vertebral body heights are maintained. No acute fracture. No static listhesis.   Degenerative disc changes again noted at L5-S1. Disc spaces are otherwise maintained.   SI joints are unremarkable.   IMPRESSION: 1. No acute osseous abnormality. 2. Degenerative disc changes again noted at L5-S1.      COGNITION: Overall cognitive status: Within functional limits for tasks assessed     SENSATION: Not tested   POSTURE: rounded shoulders and forward head    LUMBAR ROM:   AROM eval  Flexion WNL knee hyperextension   Extension WNL   Right lateral flexion WNL   Left lateral flexion WNL   Right rotation 50% limited   Left rotation 75% limited   (Blank rows = not tested)    LOWER EXTREMITY MMT:    MMT Right eval Left eval  Hip flexion 4- 4-  Hip extension    Hip abduction 3- 3-  Hip adduction    Hip internal rotation    Hip external rotation    Knee flexion 3- 3-  Knee extension 3 3-  Ankle dorsiflexion 4+ 4+  Ankle plantarflexion    Ankle inversion    Ankle eversion     (Blank rows = not tested)    FUNCTIONAL TESTS:    Screened DGI activities with SPC, WNL but very unsteady when mobilizing without SPC- tends to scissor, drift R/L, B knee hyperextension noted   GAIT: Distance walked: in clinic distances  Assistive device utilized: Single point cane Level of assistance: Modified independence Comments: wide BOS with SPC, knee hyperextension noted but steady with device, gait pattern consistent with chronic NMR disease   TREATMENT DATE:   05/19/24  Nustep L5x8 minutes all four extremities seat  11 Discussed foot up brace for R foot, provided print out of [employer] + ABD into red TB x10  Supine single leg clam  shells x10 B STS from high mat table cues to not lock out knees when standing 2x10  (first set 0#, second set 3# goblet hold)  Tandem stance solid surface 3x30 seconds B Narrow BOS blue foam 3x30 seconds  Tandem walks solid surface next to TM rail x3 laps Side steps solid surface next to TM rail x3 laps       05/14/24 NuStep L5 x 6 min Sit to stand from elevated surface 2x10 no UE.  Compensation due to weakness  LAQ 2lb 2x10 HS curls green 2x10  Seated rows green 2x10 Shoulder Ext green 2x10 Hip add ball squeeze 2x10  04/30/24  Exam, POC, HEP and education as below      HEP practice and discussion as appropriate                                                                                                                               PATIENT EDUCATION:  Education details: exam findings, POC, HEP  Person educated: Patient Education method: Explanation, Demonstration, and Handouts Education comprehension: verbalized understanding, returned demonstration, and needs further education  HOME EXERCISE PROGRAM:  Access Code: [email or web address] URL: [email or web address] Date: 04/30/2024 Prepared by: Josette Rough  Exercises - Supine Lower Trunk Rotation  - 2 x daily - 7 x weekly - 1 sets - 10 reps - 5 seconds  hold - Supine Single Knee to Chest Stretch  - 2 x daily - 7 x weekly - 1 sets - 10 reps - 5 seconds  hold - Supine Bridge  - 1 x daily - 5 x weekly - 1-2 sets - 10 reps - Supine Posterior Pelvic Tilt  - 1 x daily - 5 x weekly - 1-2 sets - 10 reps - 3 seconds  hold - Standing Tandem Balance with Counter Support  - 1 x daily - 7 x weekly - 1 sets - 4-6 reps - 15-30  seconds  hold  ASSESSMENT:  CLINICAL IMPRESSION:   Arrives today doing well, we continued working on some core and hip strengthening today as well as balance training.  Remains motivated, we will continue to work towards all goals. Discussed foot up brace for her R foot as she has very limited strength and poor proprioception along with foot drop here- definitely elevates her fall risk.    OBJECTIVE IMPAIRMENTS: Abnormal gait, decreased activity tolerance, decreased balance, decreased mobility, difficulty walking, decreased ROM, decreased strength, and pain.   ACTIVITY LIMITATIONS: bending, sitting, standing, squatting, stairs, transfers, and locomotion level  PARTICIPATION LIMITATIONS: driving, shopping, community activity, yard work, and church  PERSONAL FACTORS: Fitness, Past/current experiences, Social background, Time since onset of injury/illness/exacerbation, and polymyositis  are also affecting patient's functional outcome.   REHAB POTENTIAL: Good  CLINICAL DECISION MAKING: Evolving/moderate complexity  EVALUATION COMPLEXITY: Moderate   GOALS: Goals reviewed with patient? No  SHORT TERM GOALS: Target date: 06/11/2024    Will be compliant with appropriate progressive HEP  Baseline:  Goal status: MET 05/19/24  2.  Lumbar AROM to be WNL all planes of motion and subjective feelings of stiffness to have resolved  Baseline:  Goal status: INITIAL  3.  Will demonstrate improved awareness of functional posture with all tasks  Baseline:  Goal status: INITIAL  4.  Will score at least 15 on DGI without SPC  Baseline:  Goal status: INITIAL    LONG TERM GOALS: Target date: 07/23/2024    MMT to have improved by one grade all weak groups  Baseline:  Goal status: INITIAL  2.  Will score at least 18 on DGI no device  Baseline:  Goal status: INITIAL  3.  Will be able to perform all functional tasks at home and in community for extended periods with no increased stiffness or pain  Baseline:  Goal status: INITIAL  4.  Will be compliant with appropriate progressive advanced HEP vs gym programming appropriate for polymyositis  Baseline:   Goal status: INITIAL    PLAN:  PT FREQUENCY: 2x/week  PT DURATION: 12 weeks  PLANNED INTERVENTIONS: 97750- Physical Performance Testing, 97110-Therapeutic exercises, 97530- Therapeutic activity, [id number]- Neuromuscular re-education, 97535- Self Care, 02859- Manual therapy, [id number]- Gait training, and [id number]- Aquatic Therapy.  PLAN FOR NEXT SESSION: progress strength based interventions carefully due to polymyositis; lumbar and hip mobility, core strengthening, balance training.   Josette Rough, PT, DPT 05/26/24 11:01 AM

## 2024-05-26 NOTE — Progress Notes (Signed)
 THERAPIST PROGRESS NOTE   Session Date: 05/26/2024  Session Time: 1400  Clinician connected with pt via Caregility video call for today's scheduled visit, observing pt to be operating vehicle upon connecting. Pt reports having a family medical emergency and having to pick up grandson from daycare due to having a fever, and being at [facility] collecting prescriptions at this time.  Pt proved open to rescheduling for an alternate time later this week.   Lynwood JONETTA Maris, MSW, LCSW 05/26/2024,  2:20 PM

## 2024-05-27 ENCOUNTER — Ambulatory Visit (HOSPITAL_COMMUNITY): Admitting: Licensed Clinical Social Worker

## 2024-05-27 DIAGNOSIS — F339 Major depressive disorder, recurrent, unspecified: Secondary | ICD-10-CM

## 2024-05-27 NOTE — Progress Notes (Unsigned)
 THERAPIST PROGRESS NOTE   Session Date: 05/27/2024  Session Time: 1012 - 1056 Virtual Visit via Video Note  I connected with [name] on 05/27/24 at 10:00 AM EDT by a video enabled telemedicine application and verified that I am speaking with the correct person using two identifiers.  Location: Patient: Home Provider: Home Office   I discussed the limitations of evaluation and management by telemedicine and the availability of in person appointments. The patient expressed understanding and agreed to proceed.  The patient was advised to call back or seek an in-person evaluation if the symptoms worsen or if the condition fails to improve as anticipated.  I provided 44 minutes of non-face-to-face time during this encounter.  Participation Level: Active  Behavioral Response: CasualAlertEuthymic  Type of Therapy: Individual Therapy  Treatment Goals addressed:   Progressing (4) LTG: Reduce frequency, intensity, and duration of depression symptoms so that daily functioning is improved (OP Depression) LTG: Increase coping skills to manage depression and improve ability to perform daily activities (OP Depression) STG: Kameka will identify cognitive patterns and beliefs that support depression (OP Depression) LTG: Reduce frequency of over thinking and increased worries (Anxiety)  Completed/Met (2) LTG: Not feel hopeless or unworthy of anyone, and unable to do anything for anybody (OP Depression) STG: Improve my sleep habits (OP Depression)  Progress Towards Goals: Progressing  Interventions: CBT, Motivational Interviewing, Solution Focused, and Supportive  Summary: [name] is a 48 y.o. female with past psych history of MDD, recurrent and GAD, presenting for follow-up therapy session in efforts to improve management of depressive and anxious symptoms.   Patient actively engaged in session, presenting in pleasant moods and congruent affect throughout duration of visit.  Patient actively engaged in check-in, sharing of Everything's been fine, further detailing of having been putting in applications at various locations in efforts to secure employment, however not hearing from any employers as of yet. Pt shared of having PT 2x/week, with approx. 2 weeks remaining. Processed individual experiences with barriers to employment and needing to limit hours to part time to prevent potential factors impacting disability and medical coverages. Pt detailed additional stress surrounding concerns relating to grandson and current daycare's recent increased challenges in meeting needs in academic setting, inadequate supervision resulting in grandson eating mulch from playground, proving incapable of managing needs in relation to ASD, as well as recent transitions of multiple staff proving challenging for grandson to adjust to. Pt further shared of efforts of she and daughter exploring alternate academic settings to better support grandson's needs.  Patient responded well to interventions. Patient continues to meet criteria for MDD, recurrent, and GAD. Patient will continue to benefit from engagement in outpatient therapy due to being the least restrictive service to meet presenting needs.      05/27/2024   10:28 AM 04/07/2024    2:18 PM 03/18/2024    2:46 PM 03/04/2024    2:25 PM  GAD 7 : Generalized Anxiety Score  Nervous, Anxious, on Edge 0 0 0 0  Control/stop worrying 1 0 0 1  Worry too much - different things 1 0 0 0  Trouble relaxing 0 0 1 0  Restless 0 0 0 0  Easily annoyed or irritable 0 0 0 0  Afraid - awful might happen 0 0 0 0  Total GAD 7 Score 2 0 1 1  Anxiety Difficulty Not difficult at all Not difficult at all Not difficult at all Not difficult at all      05/27/2024  10:31 AM 04/07/2024    1:54 PM 03/18/2024    2:49 PM 03/04/2024    2:27 PM 01/29/2024    3:41 PM  Depression screen PHQ 2/9  Decreased Interest 0 0 0 0 0  Down, Depressed, Hopeless 0 0 0 0 0  PHQ -  2 Score 0 0 0 0 0  Altered sleeping 0 1 0 1 1  Tired, decreased energy 0 0 0 0 0  Change in appetite 0 0 0 0 0  Feeling bad or failure about yourself  0 0 0 0 1  Trouble concentrating 0 1 1 1 1   Moving slowly or fidgety/restless 0 0 0 0 0  Suicidal thoughts 0 0 0 0 0  PHQ-9 Score 0 2 1 2 3   Difficult doing work/chores  Not difficult at all Not difficult at all Not difficult at all Not difficult at all   Suicidal/Homicidal: Nowithout intent/plan  Therapist Response: Clinician utilized CBT, MI, solution focused, and supportive reflection interventions to address presenting stressors and sxs. Clinician actively engaged pt in check-in, assessing mood and affect.  Clinician openly greeted patient upon joining virtual visit, assessing presenting moods and affect, actively engaging pt in introductory check-in of daily events and moods, utilizing open ended questions to elicit pt's recounts of events of recent weeks, newly observed/identified stressors, recurring stressors, implications on moods, and individual abilities at managing such challenges.  Utilized active listening techniques to support pt's recounts of events, providing support and validation of identified thoughts, feelings, and perspectives as it relates to identified challenges. Provided support in processing pt's individual abilities at navigating stressors and maintained improvements in moods. Clinician reassessed severity of presenting sxs, and presence of any safety concerns.  Patient proves to continue to maintain moderate progress towards identified treatment goals.  []  Cognitive Challenging [x]  Cognitive Refocusing []  Cognitive Reframing  []  Communication Skills []  Compliance Issues []  DBT  []  Exploration of Coping Patterns []  Exploration of Emotions []  Exploration of Relationship Patterns []  Guided Imagery []  Interactive Feedback []  Interpersonal Resolutions []  Mindfulness Training []  Preventative Services []  Psycho-Education []   Relaxation/Deep Breathing []  Review of Treatment Plan/Progress []  Role-Play/Behavioral Rehearsal  [x]  Structured Problem Solving [x]  Supportive Reflection []  Symptom Management []  Other   Plan: Return again in 3 weeks.  Diagnosis:  Encounter Diagnosis  Name Primary?   Depression, recurrent Yes     Collaboration of Care: Other none necessary at this time.  Patient/Guardian was advised Release of Information must be obtained prior to any record release in order to collaborate their care with an outside provider. Patient/Guardian was advised if they have not already done so to contact the registration department to sign all necessary forms in order for us  to release information regarding their care.   Consent: Patient/Guardian gives verbal consent for treatment and assignment of benefits for services provided during this visit. Patient/Guardian expressed understanding and agreed to proceed.   Lynwood JONETTA Maris, MSW, LCSW 05/27/2024,  10:33 AM

## 2024-05-28 ENCOUNTER — Ambulatory Visit: Attending: Nurse Practitioner | Admitting: Physical Therapy

## 2024-05-28 ENCOUNTER — Encounter: Payer: Self-pay | Admitting: Physical Therapy

## 2024-05-28 DIAGNOSIS — R2689 Other abnormalities of gait and mobility: Secondary | ICD-10-CM | POA: Diagnosis present

## 2024-05-28 DIAGNOSIS — R2681 Unsteadiness on feet: Secondary | ICD-10-CM | POA: Insufficient documentation

## 2024-05-28 DIAGNOSIS — Z9181 History of falling: Secondary | ICD-10-CM | POA: Diagnosis present

## 2024-05-28 DIAGNOSIS — M5459 Other low back pain: Secondary | ICD-10-CM | POA: Insufficient documentation

## 2024-05-28 NOTE — Therapy (Signed)
 OUTPATIENT PHYSICAL THERAPY THORACOLUMBAR TREATMENT    Patient Name: [name] MRN: [id number] DOB:[birth year], 48 y.o., female Today's Date: 05/28/2024  END OF SESSION:  PT End of Session - 05/28/24 1205     Visit Number 4    Number of Visits 25    Date for Recertification  07/23/24    Authorization Type [employer] Dual and MCD    Authorization Time Period 04/30/24 to 07/23/24    Progress Note Due on Visit 10    PT Start Time 1147    PT Stop Time 1226    PT Time Calculation (min) 39 min    Activity Tolerance Patient tolerated treatment well    Behavior During Therapy [facility] for tasks assessed/performed            Past Medical History:  Diagnosis Date   Allergy    Anxiety    Hyperlipidemia    HYPERTENSION, BENIGN ESSENTIAL 06/16/2008   Qualifier: Diagnosis of  By: Gladis FNP, Nykedtra     Osteoporosis    Polymyositis (HCC) 2009   RLS (restless legs syndrome)    Vitamin D  deficiency    Past Surgical History:  Procedure Laterality Date   BRAIN SURGERY     Patient Active Problem List   Diagnosis Date Noted   Gait abnormality 05/05/2024   Weight loss, non-intentional 11/21/2023   Longitudinal melanonychia 10/16/2023   HTN (hypertension) 10/16/2023   Syrinx (HCC) 05/11/2023   Gastroesophageal reflux disease 05/06/2021   GAD (generalized anxiety disorder) 01/06/2021   Depression, recurrent 01/06/2021   RLS (restless legs syndrome) 11/07/2018   Long term systemic steroid user 11/07/2018   Vitamin D  deficiency 02/21/2017   Genital herpes simplex type 2 02/21/2017   Osteoporosis 01/30/2014   HYPERCHOLESTEROLEMIA 01/28/2009   RENAL CALCULUS, RIGHT 06/18/2008   Polymyositis (HCC) 06/16/2008   CHIARI MALFORMATION 11/20/2007   Left-sided weakness 11/18/2007    PCP: Katheen Roselie Rockford, NP  REFERRING PROVIDER: Katheen Roselie Rockford, NP  REFERRING DIAG:  Diagnosis  W19.XXXD (ICD-10-CM) - Fall, subsequent encounter  M53.3 (ICD-10-CM) - Sacral back pain  M51.370  (ICD-10-CM) - Degeneration of intervertebral disc of lumbosacral region with discogenic back pain  M33.20 (ICD-10-CM) - Polymyositis (HCC)    Rationale for Evaluation and Treatment: Rehabilitation  THERAPY DIAG:  Other low back pain  Other abnormalities of gait and mobility  Unsteadiness on feet  History of falling  ONSET DATE: Fall in July  SUBJECTIVE:  SUBJECTIVE STATEMENT:  Not feeling as stiff since starting PT, felt OK after last session with just a little soreness    EVAL: I had a fall in July, fell on my butt. I have polymyositis and since the fall I'm extra stiff, have been trying to do my baseline exercises. Having some on and off pain, but stiffness in my back has gotten worse. I feel like I'm getting weaker in my back. In 2010 I had to have emergency brain surgery for [facility], after 3 more months they found that I had polymyositis. Currently getting infusions once a year for the polymyositis, in remission but from 2010 to 2016 I was in a WC.   PERTINENT HISTORY:  See above   PAIN:  Are you having pain? Yes: NPRS scale: 2/10 Pain location: B hips  Pain description: soreness   Aggravating factors: standing or sitting too long  Relieving factors: heat, gentle movement     PRECAUTIONS: Fall and Other: polymyositis   RED FLAGS: None   WEIGHT BEARING RESTRICTIONS: No  FALLS:  Has patient fallen in last 6 months? Yes. Number of falls 1- slipped in some water in the bathroom   LIVING ENVIRONMENT: Lives with: lives alone Lives in: House/apartment Stairs: 1 STE to enter, no steps inside  Has following equipment at home: Single point cane, shower chair, grab bars   OCCUPATION: on disability   PLOF: Independent, Independent with basic ADLs, Independent with gait, and  Independent with transfers  PATIENT GOALS: make back stronger and address stiffness, build up leg strength   NEXT MD VISIT: November 11th with PCP   OBJECTIVE:  Note: Objective measures were completed at Evaluation unless otherwise noted.  DIAGNOSTIC FINDINGS:   EXAM: LUMBAR SPINE - COMPLETE 4+ VIEW; SACRUM AND COCCYX - 2+ VIEW   COMPARISON:  CT abdomen/pelvis dated 11/29/2023.   FINDINGS: 5 nonrib bearing lumbar-type vertebral bodies.   Vertebral body heights are maintained. No acute fracture. No static listhesis.   Degenerative disc changes again noted at L5-S1. Disc spaces are otherwise maintained.   SI joints are unremarkable.   IMPRESSION: 1. No acute osseous abnormality. 2. Degenerative disc changes again noted at L5-S1.  CLINICAL DATA:  Fall.  Pain.   EXAM: LUMBAR SPINE - COMPLETE 4+ VIEW; SACRUM AND COCCYX - 2+ VIEW   COMPARISON:  CT abdomen/pelvis dated 11/29/2023.   FINDINGS: 5 nonrib bearing lumbar-type vertebral bodies.   Vertebral body heights are maintained. No acute fracture. No static listhesis.   Degenerative disc changes again noted at L5-S1. Disc spaces are otherwise maintained.   SI joints are unremarkable.   IMPRESSION: 1. No acute osseous abnormality. 2. Degenerative disc changes again noted at L5-S1.      COGNITION: Overall cognitive status: Within functional limits for tasks assessed     SENSATION: Not tested   POSTURE: rounded shoulders and forward head    LUMBAR ROM:   AROM eval 05/28/24  Flexion WNL knee hyperextension    Extension WNL    Right lateral flexion WNL    Left lateral flexion WNL    Right rotation 50% limited  25% limited  Left rotation 75% limited 50% limited    (Blank rows = not tested)    LOWER EXTREMITY MMT:    MMT Right eval Left eval  Hip flexion 4- 4-  Hip extension    Hip abduction 3- 3-  Hip adduction    Hip internal rotation    Hip external rotation    Knee  flexion 3- 3-  Knee  extension 3 3-  Ankle dorsiflexion 4+ 4+  Ankle plantarflexion    Ankle inversion    Ankle eversion     (Blank rows = not tested)    FUNCTIONAL TESTS:    Screened DGI activities with SPC, WNL but very unsteady when mobilizing without SPC- tends to scissor, drift R/L, B knee hyperextension noted     05/28/24 0001  Standardized Balance Assessment  Standardized Balance Assessment Dynamic Gait Index  Dynamic Gait Index  Level Surface 2  Change in Gait Speed 2  Gait with Horizontal Head Turns 2  Gait with Vertical Head Turns 2  Gait and Pivot Turn 2  Step Over Obstacle 2  Step Around Obstacles 2  Steps 1  Total Score 15     GAIT: Distance walked: in clinic distances  Assistive device utilized: Single point cane Level of assistance: Modified independence Comments: wide BOS with SPC, knee hyperextension noted but steady with device, gait pattern consistent with chronic NMR disease   TREATMENT DATE:   05/28/24  Nustep L5x8 minutes seat 11 all four extremities  DGI, lumbar ROM, goals   Bridge + ABD into red TB x15 Sidelying clams red TB x10 B Seated SLRs 0# x10 B  Tandem walks in // bars solid surface x4 laps Side steps blue foam pad in // bars x4 laps  One foot on BOSU/other on solid surface 2x30 seconds B  Tried locking/unlocking knee in single leg stance but unable without knee buckling     05/19/24  Nustep L5x8 minutes all four extremities seat 11 Discussed foot up brace for R foot, provided print out of [employer] + ABD into red TB x10  Supine single leg clam shells x10 B STS from high mat table cues to not lock out knees when standing 2x10  (first set 0#, second set 3# goblet hold)  Tandem stance solid surface 3x30 seconds B Narrow BOS blue foam 3x30 seconds  Tandem walks solid surface next to TM rail x3 laps Side steps solid surface next to TM rail x3 laps       05/14/24 NuStep L5 x 6 min Sit to stand from elevated surface 2x10 no  UE.  Compensation due to weakness  LAQ 2lb 2x10 HS curls green 2x10  Seated rows green 2x10 Shoulder Ext green 2x10 Hip add ball squeeze 2x10  04/30/24  Exam, POC, HEP and education as below      HEP practice and discussion as appropriate                                                                                                                               PATIENT EDUCATION:  Education details: exam findings, POC, HEP  Person educated: Patient Education method: Explanation, Demonstration, and Handouts Education comprehension: verbalized understanding, returned demonstration, and needs further education  HOME EXERCISE PROGRAM:  Access Code: [email or web address] URL: [email or web address] Date: 05/28/2024 Prepared by:  Josette Rough  Exercises - Supine Lower Trunk Rotation  - 2 x daily - 7 x weekly - 1 sets - 10 reps - 5 seconds  hold - Supine Single Knee to Chest Stretch  - 2 x daily - 7 x weekly - 1 sets - 10 reps - 5 seconds  hold - Supine Posterior Pelvic Tilt  - 1 x daily - 5 x weekly - 1-2 sets - 10 reps - 3 seconds  hold - Standing Tandem Balance with Counter Support  - 1 x daily - 7 x weekly - 1 sets - 4-6 reps - 15-30  seconds  hold - Clamshell with Resistance  - 1 x daily - 4 x weekly - 1-2 sets - 10 reps - Supine Bridge with Resistance Band  - 1 x daily - 4 x weekly - 1-2 sets - 10 reps - Seated Straight Leg Raise   - 1 x daily - 4-5 x weekly - 1-2 sets - 10 reps  ASSESSMENT:  CLINICAL IMPRESSION:  Arrived today doing well, making good progress towards all STGs. Continued challenging strength and balance today as tolerated, also updated HEP as appropriate. Still with a lot of quad weakness and knee hyperextension in stance. Remains motivated to improve with skilled PT services.     OBJECTIVE IMPAIRMENTS: Abnormal gait, decreased activity tolerance, decreased balance, decreased mobility, difficulty walking, decreased ROM, decreased strength, and pain.    ACTIVITY LIMITATIONS: bending, sitting, standing, squatting, stairs, transfers, and locomotion level  PARTICIPATION LIMITATIONS: driving, shopping, community activity, yard work, and church  PERSONAL FACTORS: Fitness, Past/current experiences, Social background, Time since onset of injury/illness/exacerbation, and polymyositis  are also affecting patient's functional outcome.   REHAB POTENTIAL: Good  CLINICAL DECISION MAKING: Evolving/moderate complexity  EVALUATION COMPLEXITY: Moderate   GOALS: Goals reviewed with patient? No  SHORT TERM GOALS: Target date: 06/11/2024    Will be compliant with appropriate progressive HEP  Baseline: Goal status: MET 05/19/24  2.  Lumbar AROM to be WNL all planes of motion and subjective feelings of stiffness to have resolved  Baseline:  Goal status: ONGOING 05/28/24  3.  Will demonstrate improved awareness of functional posture with all tasks  Baseline:  Goal status: ONGOING 05/28/24  4.  Will score at least 15 on DGI without SPC  Baseline:  Goal status: MET 05/28/24    LONG TERM GOALS: Target date: 07/23/2024    MMT to have improved by one grade all weak groups  Baseline:  Goal status: INITIAL  2.  Will score at least 18 on DGI no device  Baseline:  Goal status: INITIAL  3.  Will be able to perform all functional tasks at home and in community for extended periods with no increased stiffness or pain  Baseline:  Goal status: INITIAL  4.  Will be compliant with appropriate progressive advanced HEP vs gym programming appropriate for polymyositis  Baseline:  Goal status: INITIAL    PLAN:  PT FREQUENCY: 2x/week  PT DURATION: 12 weeks  PLANNED INTERVENTIONS: 97750- Physical Performance Testing, 97110-Therapeutic exercises, 97530- Therapeutic activity, [id number]- Neuromuscular re-education, 97535- Self Care, [id number]- Manual therapy, [id number]- Gait training, and [id number]- Aquatic Therapy.  PLAN FOR NEXT SESSION: progress strength  based interventions carefully due to polymyositis; lumbar and hip mobility, core strengthening, balance training. HEP progressions PRN   Josette Rough, PT, DPT 05/28/24 12:26 PM

## 2024-06-02 ENCOUNTER — Ambulatory Visit: Admitting: Physical Therapy

## 2024-06-04 ENCOUNTER — Ambulatory Visit: Admitting: Physical Therapy

## 2024-06-04 ENCOUNTER — Encounter: Payer: Self-pay | Admitting: Physical Therapy

## 2024-06-04 DIAGNOSIS — R2681 Unsteadiness on feet: Secondary | ICD-10-CM

## 2024-06-04 DIAGNOSIS — R2689 Other abnormalities of gait and mobility: Secondary | ICD-10-CM

## 2024-06-04 DIAGNOSIS — M5459 Other low back pain: Secondary | ICD-10-CM

## 2024-06-04 DIAGNOSIS — Z9181 History of falling: Secondary | ICD-10-CM

## 2024-06-04 NOTE — Therapy (Signed)
 OUTPATIENT PHYSICAL THERAPY THORACOLUMBAR TREATMENT    Patient Name: [name] MRN: [id number] DOB:05-07-1976, 48 y.o., female Today's Date: 06/04/2024  END OF SESSION:  PT End of Session - 06/04/24 1058     Visit Number 5    Date for Recertification  07/23/24    PT Start Time 1100    PT Stop Time 1145    PT Time Calculation (min) 45 min    Activity Tolerance Patient tolerated treatment well    Behavior During Therapy [facility] for tasks assessed/performed            Past Medical History:  Diagnosis Date   Allergy    Anxiety    Hyperlipidemia    HYPERTENSION, BENIGN ESSENTIAL 06/16/2008   Qualifier: Diagnosis of  By: Gladis FNP, Nykedtra     Osteoporosis    Polymyositis (HCC) 2009   RLS (restless legs syndrome)    Vitamin D  deficiency    Past Surgical History:  Procedure Laterality Date   BRAIN SURGERY     Patient Active Problem List   Diagnosis Date Noted   Gait abnormality 05/05/2024   Weight loss, non-intentional 11/21/2023   Longitudinal melanonychia 10/16/2023   HTN (hypertension) 10/16/2023   Syrinx (HCC) 05/11/2023   Gastroesophageal reflux disease 05/06/2021   GAD (generalized anxiety disorder) 01/06/2021   Depression, recurrent 01/06/2021   RLS (restless legs syndrome) 11/07/2018   Long term systemic steroid user 11/07/2018   Vitamin D  deficiency 02/21/2017   Genital herpes simplex type 2 02/21/2017   Osteoporosis 01/30/2014   HYPERCHOLESTEROLEMIA 01/28/2009   RENAL CALCULUS, RIGHT 06/18/2008   Polymyositis (HCC) 06/16/2008   CHIARI MALFORMATION 11/20/2007   Left-sided weakness 11/18/2007    PCP: Katheen Roselie Rockford, NP  REFERRING PROVIDER: Katheen Roselie Rockford, NP  REFERRING DIAG:  Diagnosis  W19.XXXD (ICD-10-CM) - Fall, subsequent encounter  M53.3 (ICD-10-CM) - Sacral back pain  M51.370 (ICD-10-CM) - Degeneration of intervertebral disc of lumbosacral region with discogenic back pain  M33.20 (ICD-10-CM) - Polymyositis (HCC)     Rationale for Evaluation and Treatment: Rehabilitation  THERAPY DIAG:  Other abnormalities of gait and mobility  Unsteadiness on feet  History of falling  Other low back pain  ONSET DATE: Fall in July  SUBJECTIVE:                                                                                                                                                                                           SUBJECTIVE STATEMENT:  Tired, went to sleep late last night   EVAL: I had a fall in July, fell on my butt. I have polymyositis and since the fall  I'm extra stiff, have been trying to do my baseline exercises. Having some on and off pain, but stiffness in my back has gotten worse. I feel like I'm getting weaker in my back. In 2010 I had to have emergency brain surgery for [facility], after 3 more months they found that I had polymyositis. Currently getting infusions once a year for the polymyositis, in remission but from 2010 to 2016 I was in a WC.   PERTINENT HISTORY:  See above   PAIN:  Are you having pain? Yes: NPRS scale: 0/10 Pain location: B hips  Pain description: soreness   Aggravating factors: standing or sitting too long  Relieving factors: heat, gentle movement     PRECAUTIONS: Fall and Other: polymyositis   RED FLAGS: None   WEIGHT BEARING RESTRICTIONS: No  FALLS:  Has patient fallen in last 6 months? Yes. Number of falls 1- slipped in some water in the bathroom   LIVING ENVIRONMENT: Lives with: lives alone Lives in: House/apartment Stairs: 1 STE to enter, no steps inside  Has following equipment at home: Single point cane, shower chair, grab bars   OCCUPATION: on disability   PLOF: Independent, Independent with basic ADLs, Independent with gait, and Independent with transfers  PATIENT GOALS: make back stronger and address stiffness, build up leg strength   NEXT MD VISIT: November 11th with PCP   OBJECTIVE:  Note: Objective measures were completed  at Evaluation unless otherwise noted.  DIAGNOSTIC FINDINGS:   EXAM: LUMBAR SPINE - COMPLETE 4+ VIEW; SACRUM AND COCCYX - 2+ VIEW   COMPARISON:  CT abdomen/pelvis dated 11/29/2023.   FINDINGS: 5 nonrib bearing lumbar-type vertebral bodies.   Vertebral body heights are maintained. No acute fracture. No static listhesis.   Degenerative disc changes again noted at L5-S1. Disc spaces are otherwise maintained.   SI joints are unremarkable.   IMPRESSION: 1. No acute osseous abnormality. 2. Degenerative disc changes again noted at L5-S1.  CLINICAL DATA:  Fall.  Pain.   EXAM: LUMBAR SPINE - COMPLETE 4+ VIEW; SACRUM AND COCCYX - 2+ VIEW   COMPARISON:  CT abdomen/pelvis dated 11/29/2023.   FINDINGS: 5 nonrib bearing lumbar-type vertebral bodies.   Vertebral body heights are maintained. No acute fracture. No static listhesis.   Degenerative disc changes again noted at L5-S1. Disc spaces are otherwise maintained.   SI joints are unremarkable.   IMPRESSION: 1. No acute osseous abnormality. 2. Degenerative disc changes again noted at L5-S1.      COGNITION: Overall cognitive status: Within functional limits for tasks assessed     SENSATION: Not tested   POSTURE: rounded shoulders and forward head    LUMBAR ROM:   AROM eval 05/28/24  Flexion WNL knee hyperextension    Extension WNL    Right lateral flexion WNL    Left lateral flexion WNL    Right rotation 50% limited  25% limited  Left rotation 75% limited 50% limited    (Blank rows = not tested)    LOWER EXTREMITY MMT:    MMT Right eval Left eval  Hip flexion 4- 4-  Hip extension    Hip abduction 3- 3-  Hip adduction    Hip internal rotation    Hip external rotation    Knee flexion 3- 3-  Knee extension 3 3-  Ankle dorsiflexion 4+ 4+  Ankle plantarflexion    Ankle inversion    Ankle eversion     (Blank rows = not tested)    FUNCTIONAL TESTS:  Screened DGI activities with SPC, WNL but  very unsteady when mobilizing without SPC- tends to scissor, drift R/L, B knee hyperextension noted     05/28/24 0001  Standardized Balance Assessment  Standardized Balance Assessment Dynamic Gait Index  Dynamic Gait Index  Level Surface 2  Change in Gait Speed 2  Gait with Horizontal Head Turns 2  Gait with Vertical Head Turns 2  Gait and Pivot Turn 2  Step Over Obstacle 2  Step Around Obstacles 2  Steps 1  Total Score 15     GAIT: Distance walked: in clinic distances  Assistive device utilized: Single point cane Level of assistance: Modified independence Comments: wide BOS with SPC, knee hyperextension noted but steady with device, gait pattern consistent with chronic NMR disease   TREATMENT DATE:  06/04/24 NuStep L5 x4 min Elliptical L 1  Sit to stand 2x10  w/ UE 2x10 Leg press 20 lb 2x10 assist at times  LLE TKE x10 20lb LAQ 2lb 2x10  HS curl green 2x10  05/28/24  Nustep L5x8 minutes seat 11 all four extremities  DGI, lumbar ROM, goals   Bridge + ABD into red TB x15 Sidelying clams red TB x10 B Seated SLRs 0# x10 B  Tandem walks in // bars solid surface x4 laps Side steps blue foam pad in // bars x4 laps  One foot on BOSU/other on solid surface 2x30 seconds B  Tried locking/unlocking knee in single leg stance but unable without knee buckling     05/19/24  Nustep L5x8 minutes all four extremities seat 11 Discussed foot up brace for R foot, provided print out of [employer] + ABD into red TB x10  Supine single leg clam shells x10 B STS from high mat table cues to not lock out knees when standing 2x10  (first set 0#, second set 3# goblet hold)  Tandem stance solid surface 3x30 seconds B Narrow BOS blue foam 3x30 seconds  Tandem walks solid surface next to TM rail x3 laps Side steps solid surface next to TM rail x3 laps       05/14/24 NuStep L5 x 6 min Sit to stand from elevated surface 2x10 no UE.  Compensation due to weakness  LAQ  2lb 2x10 HS curls green 2x10  Seated rows green 2x10 Shoulder Ext green 2x10 Hip add ball squeeze 2x10  04/30/24  Exam, POC, HEP and education as below      HEP practice and discussion as appropriate                                                                                                                               PATIENT EDUCATION:  Education details: exam findings, POC, HEP  Person educated: Patient Education method: Explanation, Demonstration, and Handouts Education comprehension: verbalized understanding, returned demonstration, and needs further education  HOME EXERCISE PROGRAM:  Access Code: [email or web address] URL: [email or web address] Date: 05/28/2024 Prepared by: Josette Rough  Exercises -  Supine Lower Trunk Rotation  - 2 x daily - 7 x weekly - 1 sets - 10 reps - 5 seconds  hold - Supine Single Knee to Chest Stretch  - 2 x daily - 7 x weekly - 1 sets - 10 reps - 5 seconds  hold - Supine Posterior Pelvic Tilt  - 1 x daily - 5 x weekly - 1-2 sets - 10 reps - 3 seconds  hold - Standing Tandem Balance with Counter Support  - 1 x daily - 7 x weekly - 1 sets - 4-6 reps - 15-30  seconds  hold - Clamshell with Resistance  - 1 x daily - 4 x weekly - 1-2 sets - 10 reps - Supine Bridge with Resistance Band  - 1 x daily - 4 x weekly - 1-2 sets - 10 reps - Seated Straight Leg Raise   - 1 x daily - 4-5 x weekly - 1-2 sets - 10 reps  ASSESSMENT:  CLINICAL IMPRESSION:  Arrived today doing well, progressed with LE strengthening. Increase fatigue after sit to stands and leg press.  Decrease TKE with LAQ. Good ROM w/ HS curls.  Pt still with a lot of quad weakness and knee hyperextension in stance. Remains motivated to improve with skilled PT services.     OBJECTIVE IMPAIRMENTS: Abnormal gait, decreased activity tolerance, decreased balance, decreased mobility, difficulty walking, decreased ROM, decreased strength, and pain.   ACTIVITY LIMITATIONS: bending, sitting,  standing, squatting, stairs, transfers, and locomotion level  PARTICIPATION LIMITATIONS: driving, shopping, community activity, yard work, and church  PERSONAL FACTORS: Fitness, Past/current experiences, Social background, Time since onset of injury/illness/exacerbation, and polymyositis  are also affecting patient's functional outcome.   REHAB POTENTIAL: Good  CLINICAL DECISION MAKING: Evolving/moderate complexity  EVALUATION COMPLEXITY: Moderate   GOALS: Goals reviewed with patient? No  SHORT TERM GOALS: Target date: 06/11/2024    Will be compliant with appropriate progressive HEP  Baseline: Goal status: MET 05/19/24  2.  Lumbar AROM to be WNL all planes of motion and subjective feelings of stiffness to have resolved  Baseline:  Goal status: ONGOING 05/28/24  3.  Will demonstrate improved awareness of functional posture with all tasks  Baseline:  Goal status: ONGOING 05/28/24  4.  Will score at least 15 on DGI without SPC  Baseline:  Goal status: MET 05/28/24    LONG TERM GOALS: Target date: 07/23/2024    MMT to have improved by one grade all weak groups  Baseline:  Goal status: INITIAL  2.  Will score at least 18 on DGI no device  Baseline:  Goal status: INITIAL  3.  Will be able to perform all functional tasks at home and in community for extended periods with no increased stiffness or pain  Baseline:  Goal status: INITIAL  4.  Will be compliant with appropriate progressive advanced HEP vs gym programming appropriate for polymyositis  Baseline:  Goal status: INITIAL    PLAN:  PT FREQUENCY: 2x/week  PT DURATION: 12 weeks  PLANNED INTERVENTIONS: 97750- Physical Performance Testing, 97110-Therapeutic exercises, 97530- Therapeutic activity, [id number]- Neuromuscular re-education, 97535- Self Care, [id number]- Manual therapy, [id number]- Gait training, and [id number]- Aquatic Therapy.  PLAN FOR NEXT SESSION: progress strength based interventions carefully due to  polymyositis; lumbar and hip mobility, core strengthening, balance training. HEP progressions PRN   Tanda Sorrow, PTA 06/04/24 10:59 AM

## 2024-06-16 ENCOUNTER — Ambulatory Visit (INDEPENDENT_AMBULATORY_CARE_PROVIDER_SITE_OTHER): Admitting: Licensed Clinical Social Worker

## 2024-06-16 ENCOUNTER — Encounter (HOSPITAL_COMMUNITY): Payer: Self-pay

## 2024-06-16 DIAGNOSIS — F411 Generalized anxiety disorder: Secondary | ICD-10-CM

## 2024-06-16 DIAGNOSIS — F339 Major depressive disorder, recurrent, unspecified: Secondary | ICD-10-CM | POA: Diagnosis not present

## 2024-06-16 NOTE — Progress Notes (Unsigned)
 THERAPIST PROGRESS NOTE   Session Date: 06/16/2024  Session Time: 1411 - 1439 Virtual Visit via Video Note  I connected with [name] on 06/16/24 at  2:00 PM EDT by a video enabled telemedicine application and verified that I am speaking with the correct person using two identifiers.  Location: Patient: Client's home Provider: Home Office   I discussed the limitations of evaluation and management by telemedicine and the availability of in person appointments. The patient expressed understanding and agreed to proceed.  The patient was advised to call back or seek an in-person evaluation if the symptoms worsen or if the condition fails to improve as anticipated.  I provided 28 minutes of non-face-to-face time during this encounter.  Participation Level: Active  Behavioral Response: CasualAlertEuthymic  Type of Therapy: Individual Therapy  Treatment Goals addressed:   Progressing (2) STG: [name] will identify cognitive patterns and beliefs that support depression (OP Depression) LTG: Reduce frequency of over thinking and increased worries (Anxiety)  Completed/Met (4) LTG: Reduce frequency, intensity, and duration of depression symptoms so that daily functioning is improved (OP Depression) LTG: Increase coping skills to manage depression and improve ability to perform daily activities (OP Depression) LTG: Not feel hopeless or unworthy of anyone, and unable to do anything for anybody (OP Depression) STG: Improve my sleep habits (OP Depression)  Progress Towards Goals: Progressing  Interventions: CBT, Motivational Interviewing, Solution Focused, and Supportive  Summary: [name] is a 48 y.o. female with past psych history of MDD, recurrent and GAD, presenting for follow-up therapy session in efforts to improve management of depressive and anxious symptoms.   Patient actively engaged in session, presenting in pleasant moods and congruent affect throughout duration of  visit. Patient actively engaged in check-in, sharing of Everything's been good, further detailing of having continued to look for employment and having an interview last week, currently awaiting follow up. Pt further shared of things going well overall, detailing of currently being at a clients home to visit and see how they are doing with their new dog that pt had gotten them. Actively engaged in reassessing presenting depressive and anxious sxs via PHQ-9 and GAD-7, noting of maintained reduction in sxs. Engaged in 33mo. Review of individualized goals outlined in tx plan, processing observable progress made over the past 6 months, and continued desires for support in working towards ongoing stress/challenges.      06/16/2024    2:18 PM 05/27/2024   10:28 AM 04/07/2024    2:18 PM 03/18/2024    2:46 PM  GAD 7 : Generalized Anxiety Score  Nervous, Anxious, on Edge 0 0 0 0  Control/stop worrying 1 1 0 0  Worry too much - different things 0 1 0 0  Trouble relaxing 0 0 0 1  Restless 0 0 0 0  Easily annoyed or irritable 0 0 0 0  Afraid - awful might happen 0 0 0 0  Total GAD 7 Score 1 2 0 1  Anxiety Difficulty Not difficult at all Not difficult at all Not difficult at all Not difficult at all      06/16/2024    2:20 PM 05/27/2024   10:31 AM 04/07/2024    1:54 PM 03/18/2024    2:49 PM 03/04/2024    2:27 PM  Depression screen PHQ 2/9  Decreased Interest 0 0 0 0 0  Down, Depressed, Hopeless 0 0 0 0 0  PHQ - 2 Score 0 0 0 0 0  Altered sleeping 1 0 1 0  1  Tired, decreased energy 0 0 0 0 0  Change in appetite 0 0 0 0 0  Feeling bad or failure about yourself  0 0 0 0 0  Trouble concentrating 1 0 1 1 1   Moving slowly or fidgety/restless 0 0 0 0 0  Suicidal thoughts 0 0 0 0 0  PHQ-9 Score 2 0 2 1 2   Difficult doing work/chores Not difficult at all  Not difficult at all Not difficult at all Not difficult at all   Suicidal/Homicidal: Nowithout intent/plan  Therapist Response:  Clinician openly  greeted patient upon joining virtual visit, assessing presenting moods and affect, actively engaging pt in introductory check-in of daily events and moods,  Utilized open ended questions to elicit pt's recounts of events of recent weeks, presence of newly observed/identified stressors, ongoing stressors, implications on moods, and individual abilities at managing such challenges.   Utilized active listening techniques to support pt's recounts of events, providing support and validation of identified thoughts, feelings, and perspectives as it relates to identified challenges and progress in management of such.  Provided support in processing pt's individual abilities at navigating stressors and maintained improvements in moods.  Engaged in 66mo. Review of tx goals outlined in tx plan and overall progression towards meeting goals. Clinician utilized CBT, MI, solution focused, and supportive reflection interventions to address presenting stressors and sxs. Clinician actively engaged pt in check-in, assessing mood and affect. Clinician reassessed severity of presenting sxs, and presence of any safety concerns.    []  Cognitive Challenging [x]  Cognitive Refocusing []  Cognitive Reframing  []  Communication Skills []  Compliance Issues []  DBT  []  Exploration of Coping Patterns [x]  Exploration of Emotions []  Exploration of Relationship Patterns []  Guided Imagery []  Interactive Feedback []  Interpersonal Resolutions []  Mindfulness Training []  Preventative Services [x]  Psycho-Education []  Relaxation/Deep Breathing [x]  Review of Treatment Plan/Progress []  Role-Play/Behavioral Rehearsal  [x]  Structured Problem Solving [x]  Supportive Reflection []  Symptom Management []  Other    Patient responded well to interventions. Patient continues to meet criteria for MDD, recurrent, and GAD. Patient will continue to benefit from engagement in outpatient therapy due to being the least restrictive service to meet presenting  needs. Patient proves to continue to maintain moderate progress towards identified treatment goals.  Plan: Return again in 6 weeks.  Diagnosis:  Encounter Diagnoses  Name Primary?   Depression, recurrent Yes   GAD (generalized anxiety disorder)       Collaboration of Care: Other none necessary at this time.  Patient/Guardian was advised Release of Information must be obtained prior to any record release in order to collaborate their care with an outside provider. Patient/Guardian was advised if they have not already done so to contact the registration department to sign all necessary forms in order for us  to release information regarding their care.   Consent: Patient/Guardian gives verbal consent for treatment and assignment of benefits for services provided during this visit. Patient/Guardian expressed understanding and agreed to proceed.   Lynwood JONETTA Maris, MSW, LCSW 06/16/2024,  2:21 PM

## 2024-06-18 ENCOUNTER — Ambulatory Visit: Admitting: Dermatology

## 2024-06-18 ENCOUNTER — Encounter: Payer: Self-pay | Admitting: Dermatology

## 2024-06-18 VITALS — BP 140/110 | HR 65

## 2024-06-18 DIAGNOSIS — L608 Other nail disorders: Secondary | ICD-10-CM | POA: Diagnosis not present

## 2024-06-18 NOTE — Progress Notes (Signed)
   New Patient Visit   Subjective  [name] is a 48 y.o. female who presents for the following:  Longitudinal Melanonychia  Patient states she has dark spots located at the finger nail that she would like to have examined. Patient reports the areas have been there for 10 years.  She reports the areas are not bothersome. Patient reports she has not previously been treated for these areas. Patient reports that she used to bite nails but no longer does. She does clip nails short because of how dark they are. Patient has Hx of bx for a spot on her abdomen years ago with benign results.  Patient denies family history of skin cancer(s).  Patient states that she has Polymyositis  The following portions of the chart were reviewed this encounter and updated as appropriate: medications, allergies, medical history  Review of Systems:  No other skin or systemic complaints except as noted in HPI or Assessment and Plan.  Objective  Well appearing patient in no apparent distress; mood and affect are within normal limits.  A focused examination was performed of the following areas: Bilateral hands and nails  Relevant exam findings are noted in the Assessment and Plan.           Assessment & Plan    Drug-induced melanonychia Chronic drug-induced melanonychia with longitudinal linear melanonychia and overall darkness of the nail plate, likely related to medication use. The condition is not reversible but is not indicative of melanoma and is not dangerous.  - Perform photo documentation of the nails to monitor changes. Will check annually - Recommend ElOn nail conditioner for general nail health to moisturize and condition cuticles, especially during winter.       Return in about 1 year (around 06/18/2025) for Linear Melanonychia.  I, Lyle Cords, as acting as a Neurosurgeon for [employer], DO .   Documentation: I have reviewed the above documentation for accuracy and  completeness, and I agree with the above.  Delon Lenis, DO

## 2024-06-18 NOTE — Patient Instructions (Addendum)
 VISIT SUMMARY:  Today, we discussed your concerns about the dark pigmentation and linear lines on your nails. This pigmentation has been present since your polymyositis diagnosis and is likely related to your medication use. We reviewed your medical history, including your past treatments and surgeries.  YOUR PLAN:  -DRUG-INDUCED MELANONYCHIA:  Drug-induced melanonychia is a condition where the nails develop dark pigmentation due to medication use. This condition is not reversible but is not dangerous and does not indicate melanoma.  We will take photos of your nails to monitor any changes. Additionally, I recommend using [employer] nail conditioner to keep your nails and cuticles moisturized and healthy, especially during the winter months.  INSTRUCTIONS:  Please follow up with us  if you notice any significant changes in your nail pigmentation or if you have any other concerns.        Important Information  Due to recent changes in healthcare laws, you may see results of your pathology and/or laboratory studies on MyChart before the doctors have had a chance to review them. We understand that in some cases there may be results that are confusing or concerning to you. Please understand that not all results are received at the same time and often the doctors may need to interpret multiple results in order to provide you with the best plan of care or course of treatment. Therefore, we ask that you please give us  2 business days to thoroughly review all your results before contacting the office for clarification. Should we see a critical lab result, you will be contacted sooner.   If You Need Anything After Your Visit  If you have any questions or concerns for your doctor, please call our main line at [phone] If no one answers, please leave a voicemail as directed and we will return your call as soon as possible. Messages left after 4 pm will be answered the following business day.   You  may also send us  a message via MyChart. We typically respond to MyChart messages within 1-2 business days.  For prescription refills, please ask your pharmacy to contact our office. Our fax number is [phone].  If you have an urgent issue when the clinic is closed that cannot wait until the next business day, you can page your doctor at the number below.    Please note that while we do our best to be available for urgent issues outside of office hours, we are not available 24/7.   If you have an urgent issue and are unable to reach us , you may choose to seek medical care at your doctor's office, retail clinic, urgent care center, or emergency room.  If you have a medical emergency, please immediately call 911 or go to the emergency department. In the event of inclement weather, please call our main line at [phone] for an update on the status of any delays or closures.  Dermatology Medication Tips: Please keep the boxes that topical medications come in in order to help keep track of the instructions about where and how to use these. Pharmacies typically print the medication instructions only on the boxes and not directly on the medication tubes.   If your medication is too expensive, please contact our office at [phone] or send us  a message through MyChart.   We are unable to tell what your co-pay for medications will be in advance as this is different depending on your insurance coverage. However, we may be able to find a substitute medication at lower cost  or fill out paperwork to get insurance to cover a needed medication.   If a prior authorization is required to get your medication covered by your insurance company, please allow us  1-2 business days to complete this process.  Drug prices often vary depending on where the prescription is filled and some pharmacies may offer cheaper prices.  The website www.goodrx.com contains coupons for medications through different  pharmacies. The prices here do not account for what the cost may be with help from insurance (it may be cheaper with your insurance), but the website can give you the price if you did not use any insurance.  - You can print the associated coupon and take it with your prescription to the pharmacy.  - You may also stop by our office during regular business hours and pick up a GoodRx coupon card.  - If you need your prescription sent electronically to a different pharmacy, notify our office through [facility] or by phone at [phone]

## 2024-06-30 ENCOUNTER — Encounter: Payer: Self-pay | Admitting: Radiology

## 2024-07-07 ENCOUNTER — Ambulatory Visit (HOSPITAL_COMMUNITY): Admitting: Licensed Clinical Social Worker

## 2024-07-08 ENCOUNTER — Ambulatory Visit: Admitting: Nurse Practitioner

## 2024-07-28 ENCOUNTER — Ambulatory Visit (INDEPENDENT_AMBULATORY_CARE_PROVIDER_SITE_OTHER): Admitting: Licensed Clinical Social Worker

## 2024-07-28 DIAGNOSIS — F339 Major depressive disorder, recurrent, unspecified: Secondary | ICD-10-CM

## 2024-07-28 NOTE — Progress Notes (Unsigned)
 THERAPIST PROGRESS NOTE   Session Date: 07/28/2024  Session Time: 1407 - 1457 Virtual Visit via Video Note  I connected with Cherice R Ledwell on 07/28/24 at  2:00 PM EST by a video enabled telemedicine application and verified that I am speaking with the correct person using two identifiers.  Location: Patient: Client's home Provider: Home Office   I discussed the limitations of evaluation and management by telemedicine and the availability of in person appointments. The patient expressed understanding and agreed to proceed.  The patient was advised to call back or seek an in-person evaluation if the symptoms worsen or if the condition fails to improve as anticipated.  I provided 50 minutes of non-face-to-face time during this encounter.  Participation Level: Active  Behavioral Response: CasualAlertEuthymic  Type of Therapy: Individual Therapy  Treatment Goals addressed:   Progressing (2) STG: Thomasina will identify cognitive patterns and beliefs that support depression (OP Depression) LTG: Reduce frequency of over thinking and increased worries (Anxiety)  Completed/Met (4) LTG: Reduce frequency, intensity, and duration of depression symptoms so that daily functioning is improved (OP Depression) LTG: Increase coping skills to manage depression and improve ability to perform daily activities (OP Depression) LTG: Not feel hopeless or unworthy of anyone, and unable to do anything for anybody (OP Depression) STG: Improve my sleep habits (OP Depression)  Progress Towards Goals: Progressing  Interventions: CBT, Motivational Interviewing, Solution Focused, and Supportive  Summary: Jaydalee is a 48 y.o. female with past psych history of MDD, recurrent and GAD, presenting for follow-up therapy session in efforts to improve management of depressive and anxious symptoms.   Patient actively engaged in session, presenting in pleasant moods and congruent affect throughout duration of  visit.   Patient reported "Everything's been good," though noted no updates on employment. She stated job-seeking efforts have decreased due to seasonal factors and lack of employer responses. Patient reported learning from current employees that several businesses are on a hiring hold related to political factors and recent ICE presence.  Patient provided updates on her grandson's transition to a new school approximately three weeks ago, noting improvements in his social functioning, speech, and ADLs. She reported reduced stress for herself and her daughter since the change in academic environment.  Patient explored ongoing concerns regarding her marital relationship. She noted she and her wife have been separated for over one year but have recently been spending time together, leading to mixed feelings about the relationship's status. Patient reflected on prior readiness to finalize divorce versus current uncertainty. She described differing expectations about reconciliation--patient believes living together is necessary, while spouse desires to continue living separately. Patient also discussed her wife's interest in couples counseling and expressed willingness to participate, though she is uncertain about the focus of the work given spouse had not previously communicated concerns or sought counseling.     06/16/2024    2:18 PM 05/27/2024   10:28 AM 04/07/2024    2:18 PM 03/18/2024    2:46 PM  GAD 7 : Generalized Anxiety Score  Nervous, Anxious, on Edge 0 0 0 0  Control/stop worrying 1 1 0 0  Worry too much - different things 0 1 0 0  Trouble relaxing 0 0 0 1  Restless 0 0 0 0  Easily annoyed or irritable 0 0 0 0  Afraid - awful might happen 0 0 0 0  Total GAD 7 Score 1 2 0 1  Anxiety Difficulty Not difficult at all Not difficult at all Not difficult at  all Not difficult at all      06/16/2024    2:20 PM 05/27/2024   10:31 AM 04/07/2024    1:54 PM 03/18/2024    2:49 PM 03/04/2024    2:27 PM   Depression screen PHQ 2/9  Decreased Interest 0 0 0 0 0  Down, Depressed, Hopeless 0 0 0 0 0  PHQ - 2 Score 0 0 0 0 0  Altered sleeping 1 0 1 0 1  Tired, decreased energy 0 0 0 0 0  Change in appetite 0 0 0 0 0  Feeling bad or failure about yourself  0 0 0 0 0  Trouble concentrating 1 0 1 1 1   Moving slowly or fidgety/restless 0 0 0 0 0  Suicidal thoughts 0 0 0 0 0  PHQ-9 Score 2  0  2  1  2    Difficult doing work/chores Not difficult at all  Not difficult at all Not difficult at all Not difficult at all     Data saved with a previous flowsheet row definition   Suicidal/Homicidal: Nowithout intent/plan  Therapist Response:  Clinician openly greeted patient upon joining virtual visit, assessing presenting moods and affect, actively engaging in introductory check-in. Utilized open ended questions to elicit pt's recounts of events of the past six weeks, further evoking thoughts and perspectives surrounding ongoing stressors, implications on moods, and individual abilities at managing such challenges.  Utilized active listening techniques to support pt's recounts, providing support and validation of shared thoughts, feelings, and perspectives in relation to progressions and ongoing challenges. Provided support in processing pt's individual abilities at navigating stressors and maintained improvements in moods.  CBT strategies, psychoeducation, and solution-focused interventions used to support processing of relational concerns, employment stress, and family functioning. Clinician reassessed severity of presenting sxs, and presence of any safety concerns.    []  Cognitive Challenging [x]  Cognitive Refocusing []  Cognitive Reframing  []  Communication Skills []  Compliance Issues []  DBT  []  Exploration of Coping Patterns [x]  Exploration of Emotions [x]  Exploration of Relationship Patterns []  Guided Imagery []  Interactive Feedback []  Interpersonal Resolutions []  Mindfulness Training []   Preventative Services [x]  Psycho-Education []  Relaxation/Deep Breathing []  Review of Treatment Plan/Progress []  Role-Play/Behavioral Rehearsal  [x]  Structured Problem Solving [x]  Supportive Reflection []  Symptom Management []  Other    Patient responded well to interventions. Patient continues to meet criteria for MDD, recurrent, and GAD. Patient will continue to benefit from engagement in outpatient therapy due to being the least restrictive service to meet presenting needs. Patient proves to continue to maintain moderate progress towards identified treatment goals.  Plan: Return again in 6 weeks.  Diagnosis:  Encounter Diagnosis  Name Primary?   Depression, recurrent Yes       Collaboration of Care: Other none necessary at this time.  Patient/Guardian was advised Release of Information must be obtained prior to any record release in order to collaborate their care with an outside provider. Patient/Guardian was advised if they have not already done so to contact the registration department to sign all necessary forms in order for us  to release information regarding their care.   Consent: Patient/Guardian gives verbal consent for treatment and assignment of benefits for services provided during this visit. Patient/Guardian expressed understanding and agreed to proceed.   Lynwood JONETTA Maris, MSW, LCSW 07/28/2024,  2:08 PM

## 2024-08-06 ENCOUNTER — Encounter: Payer: Self-pay | Admitting: Nurse Practitioner

## 2024-08-06 ENCOUNTER — Ambulatory Visit (INDEPENDENT_AMBULATORY_CARE_PROVIDER_SITE_OTHER): Admitting: Nurse Practitioner

## 2024-08-06 VITALS — BP 120/80 | HR 74 | Temp 98.6°F | Ht 69.0 in | Wt 161.6 lb

## 2024-08-06 DIAGNOSIS — Z23 Encounter for immunization: Secondary | ICD-10-CM

## 2024-08-06 DIAGNOSIS — R634 Abnormal weight loss: Secondary | ICD-10-CM

## 2024-08-06 DIAGNOSIS — Z7952 Long term (current) use of systemic steroids: Secondary | ICD-10-CM | POA: Diagnosis not present

## 2024-08-06 DIAGNOSIS — M818 Other osteoporosis without current pathological fracture: Secondary | ICD-10-CM

## 2024-08-06 DIAGNOSIS — D751 Secondary polycythemia: Secondary | ICD-10-CM | POA: Insufficient documentation

## 2024-08-06 DIAGNOSIS — E559 Vitamin D deficiency, unspecified: Secondary | ICD-10-CM | POA: Diagnosis not present

## 2024-08-06 DIAGNOSIS — I10 Essential (primary) hypertension: Secondary | ICD-10-CM | POA: Diagnosis not present

## 2024-08-06 DIAGNOSIS — E7800 Pure hypercholesterolemia, unspecified: Secondary | ICD-10-CM | POA: Diagnosis not present

## 2024-08-06 LAB — LIPID PANEL
Cholesterol: 230 mg/dL — ABNORMAL HIGH (ref 0–200)
HDL: 74.2 mg/dL (ref >39.00)
LDL Cholesterol: 142 mg/dL — ABNORMAL HIGH (ref 0–99)
NonHDL: 155.69
Total CHOL/HDL Ratio: 3
Triglycerides: 69 mg/dL (ref 0.0–149.0)
VLDL: 13.8 mg/dL (ref 0.0–40.0)

## 2024-08-06 LAB — CBC WITH DIFFERENTIAL/PLATELET
Basophils Absolute: 0 K/uL (ref 0.0–0.1)
Basophils Relative: 0.3 % (ref 0.0–3.0)
Eosinophils Absolute: 0.1 K/uL (ref 0.0–0.7)
Eosinophils Relative: 1.6 % (ref 0.0–5.0)
HCT: 43.9 % (ref 36.0–46.0)
Hemoglobin: 15.1 g/dL — ABNORMAL HIGH (ref 12.0–15.0)
Lymphocytes Relative: 32.2 % (ref 12.0–46.0)
Lymphs Abs: 2 K/uL (ref 0.7–4.0)
MCHC: 34.4 g/dL (ref 30.0–36.0)
MCV: 94.7 fl (ref 78.0–100.0)
Monocytes Absolute: 0.4 K/uL (ref 0.1–1.0)
Monocytes Relative: 5.6 % (ref 3.0–12.0)
Neutro Abs: 3.8 K/uL (ref 1.4–7.7)
Neutrophils Relative %: 60.3 % (ref 43.0–77.0)
Platelets: 301 K/uL (ref 150.0–400.0)
RBC: 4.64 Mil/uL (ref 3.87–5.11)
RDW: 14.7 % (ref 11.5–15.5)
WBC: 6.3 K/uL (ref 4.0–10.5)

## 2024-08-06 LAB — COMPREHENSIVE METABOLIC PANEL WITH GFR
ALT: 18 U/L (ref 0–35)
AST: 21 U/L (ref 0–37)
Albumin: 4.6 g/dL (ref 3.5–5.2)
Alkaline Phosphatase: 66 U/L (ref 39–117)
BUN: 10 mg/dL (ref 6–23)
CO2: 25 meq/L (ref 19–32)
Calcium: 9.5 mg/dL (ref 8.4–10.5)
Chloride: 105 meq/L (ref 96–112)
Creatinine, Ser: 0.44 mg/dL (ref 0.40–1.20)
GFR: 115.85 mL/min (ref >60.00)
Glucose, Bld: 83 mg/dL (ref 70–99)
Potassium: 4.3 meq/L (ref 3.5–5.1)
Sodium: 136 meq/L (ref 135–145)
Total Bilirubin: 0.4 mg/dL (ref 0.2–1.2)
Total Protein: 8.4 g/dL — ABNORMAL HIGH (ref 6.0–8.3)

## 2024-08-06 LAB — VITAMIN D 25 HYDROXY (VIT D DEFICIENCY, FRACTURES): VITD: 35.79 ng/mL (ref 30.00–100.00)

## 2024-08-06 LAB — HEMOGLOBIN A1C: Hgb A1c MFr Bld: 5.1 % (ref 4.6–6.5)

## 2024-08-06 NOTE — Assessment & Plan Note (Signed)
Repeat vit. D Maintain OVER THE COUNTER dose 1000IU daily

## 2024-08-06 NOTE — Assessment & Plan Note (Signed)
 Repeat CBC.

## 2024-08-06 NOTE — Assessment & Plan Note (Addendum)
 IV reclast  administered 02/2024 Advised to maintain daily dose of calcium  and vit. D Repeat dexa scan in 1year

## 2024-08-06 NOTE — Patient Instructions (Signed)
 Go to lab Maintain Heart healthy diet and daily exercise. Maintain current medications.

## 2024-08-06 NOTE — Assessment & Plan Note (Signed)
 BP at goal with amlodipine   BP Readings from Last 3 Encounters:  08/06/24 120/80  06/18/24 (!) 140/110  05/05/24 128/88    Maintain med dose Repeat CMP F/up in 3months

## 2024-08-06 NOTE — Assessment & Plan Note (Signed)
 6lbs weight gain noted with drinking ensure and frequent meals Wt Readings from Last 3 Encounters:  08/06/24 161 lb 9.6 oz (73.3 kg)  05/05/24 155 lb (70.3 kg)  04/07/24 155 lb 6.4 oz (70.5 kg)

## 2024-08-06 NOTE — Assessment & Plan Note (Signed)
 Repeat lipid panel ?

## 2024-08-06 NOTE — Progress Notes (Signed)
 Established Patient Visit  Patient: [name]   DOB: [birth year]   48 y.o. Female  MRN: [id number] Visit Date: 08/06/2024  Subjective:    Chief Complaint  Patient presents with   Follow-up    Pt is fasting, rash on left shoulder, wants flu shot. Medication refill for  refivalACYclovir (VALTREX ) 500 MG tablet When is next prolia .   HPI HTN (hypertension) BP at goal with amlodipine   BP Readings from Last 3 Encounters:  08/06/24 120/80  06/18/24 (!) 140/110  05/05/24 128/88    Maintain med dose Repeat CMP F/up in 3months  Vitamin D  deficiency Repeat vit. D Maintain OVER THE COUNTER dose 1000IU daily  Polycythemia Repeat CBC  HYPERCHOLESTEROLEMIA Repeat lipid panel  Weight loss, non-intentional 6lbs weight gain noted with drinking ensure and frequent meals Wt Readings from Last 3 Encounters:  08/06/24 161 lb 9.6 oz (73.3 kg)  05/05/24 155 lb (70.3 kg)  04/07/24 155 lb 6.4 oz (70.5 kg)     Other osteoporosis without current pathological fracture IV reclast  administered 02/2024 Advised to maintain daily dose of calcium  and vit. D Repeat dexa scan in 1year  Wt Readings from Last 3 Encounters:  08/06/24 161 lb 9.6 oz (73.3 kg)  05/05/24 155 lb (70.3 kg)  04/07/24 155 lb 6.4 oz (70.5 kg)    Reviewed medical, surgical, and social history today  Medications: Outpatient Medications Prior to Visit  Medication Sig   amLODipine  (NORVASC ) 5 MG tablet TAKE 1 TABLET BY MOUTH EVERYDAY AT BEDTIME   Cholecalciferol (VITAMIN D3) 25 MCG (1000 UT) CAPS Take by mouth.   cycloSPORINE (RESTASIS) 0.05 % ophthalmic emulsion 1 drop 2 (two) times daily.   HYDROcodone -acetaminophen  (NORCO/VICODIN) 5-325 MG tablet Take 1 tablet by mouth every 6 (six) hours as needed.   ibuprofen  (ADVIL ) 600 MG tablet TAKE 1 TABLET (600 MG TOTAL) BY MOUTH EVERY 12 (TWELVE) HOURS AS NEEDED (WITH FOOD).   Multiple Vitamins-Minerals (ONE-A-DAY VITACRAVES ADULT) CHEW Chew 1 each by mouth  daily.   predniSONE  (DELTASONE ) 2.5 MG tablet Take 2.5 mg by mouth daily.   riTUXimab  (RITUXAN ) 500 MG/50ML injection Inject 1,000 mg into the vein every 6 (six) months.    valACYclovir  (VALTREX ) 500 MG tablet Take 500 mg by mouth as needed (outbreak). Take 1 tablet as needed for onset out breaks   zoledronic  acid (RECLAST ) 5 MG/100ML SOLN injection Inject 5 mg into the vein once.   [DISCONTINUED] denosumab  (PROLIA ) 60 MG/ML SOSY injection Inject 60 mg into the skin every 6 (six) months.   [DISCONTINUED] cromolyn (OPTICROM) 4 % ophthalmic solution Place 1 drop into both eyes as needed. (Patient not taking: Reported on 08/06/2024)   [DISCONTINUED] hydrOXYzine  (VISTARIL ) 25 MG capsule TAKE 1 CAPSULE BY MOUTH AT BEDTIME. (Patient not taking: Reported on 08/06/2024)   [DISCONTINUED] sodium chloride  (OCEAN) 0.65 % SOLN nasal spray Place 1 spray into both nostrils as needed for congestion. (Patient not taking: Reported on 08/06/2024)   No facility-administered medications prior to visit.   Reviewed past medical and social history.   ROS per HPI above      Objective:  BP 120/80 (BP Location: Right Arm, Patient Position: Sitting, Cuff Size: Normal)   Pulse 74   Temp 98.6 F (37 C) (Temporal)   Ht 5' 9 (1.753 m)   Wt 161 lb 9.6 oz (73.3 kg)   LMP 07/04/2024 (Exact Date)   SpO2 99%   BMI 23.86  kg/m      Physical Exam Vitals and nursing note reviewed.  Cardiovascular:     Rate and Rhythm: Normal rate and regular rhythm.     Pulses: Normal pulses.     Heart sounds: Normal heart sounds.  Pulmonary:     Effort: Pulmonary effort is normal.     Breath sounds: Normal breath sounds.  Musculoskeletal:     Right lower leg: No edema.     Left lower leg: No edema.  Neurological:     Mental Status: She is alert and oriented to person, place, and time.  Psychiatric:        Mood and Affect: Mood normal.        Behavior: Behavior normal.        Thought Content: Thought content normal.      No results found for any visits on 08/06/24.    Assessment & Plan:    Problem List Items Addressed This Visit     HTN (hypertension) - Primary   BP at goal with amlodipine   BP Readings from Last 3 Encounters:  08/06/24 120/80  06/18/24 (!) 140/110  05/05/24 128/88    Maintain med dose Repeat CMP F/up in 3months      Relevant Orders   Comprehensive metabolic panel with GFR   HYPERCHOLESTEROLEMIA   Repeat lipid panel      Relevant Orders   Comprehensive metabolic panel with GFR   Lipid panel   Other osteoporosis without current pathological fracture   IV reclast  administered 02/2024 Advised to maintain daily dose of calcium  and vit. D Repeat dexa scan in 1year      Relevant Medications   zoledronic  acid (RECLAST ) 5 MG/100ML SOLN injection   Polycythemia   Repeat CBC      Relevant Orders   CBC with Differential/Platelet   Vitamin D  deficiency   Repeat vit. D Maintain OVER THE COUNTER dose 1000IU daily      Relevant Orders   VITAMIN D  25 Hydroxy (Vit-D Deficiency, Fractures)   Weight loss, non-intentional   6lbs weight gain noted with drinking ensure and frequent meals Wt Readings from Last 3 Encounters:  08/06/24 161 lb 9.6 oz (73.3 kg)  05/05/24 155 lb (70.3 kg)  04/07/24 155 lb 6.4 oz (70.5 kg)         Other Visit Diagnoses       Immunization due       Relevant Orders   Flu vaccine HIGH DOSE PF(Fluzone Trivalent) (Completed)     Long-term corticosteroid use       Relevant Orders   Hemoglobin A1c      Return in about 3 months (around 11/04/2024) for HTN, depression and anxiety, Weight management, vit. D deficiency, hyperlipidemia (fasting).     Roselie Mood, NP

## 2024-08-07 ENCOUNTER — Ambulatory Visit: Payer: Self-pay | Admitting: Nurse Practitioner

## 2024-08-07 DIAGNOSIS — E7800 Pure hypercholesterolemia, unspecified: Secondary | ICD-10-CM

## 2024-08-08 ENCOUNTER — Telehealth: Payer: Self-pay

## 2024-08-08 MED ORDER — PRAVASTATIN SODIUM 20 MG PO TABS: ORAL_TABLET | ORAL | 1 refills | Status: AC

## 2024-08-08 NOTE — Telephone Encounter (Signed)
 Called patient and informed her that Roselie has some coupons and Ensure samples here at the office for her to pick up when she is available to. She stated that she will try to swing by the office today if not today then it will be on Monday. She thanked me for calling and all (if any) questions were answered.

## 2024-09-03 ENCOUNTER — Other Ambulatory Visit: Payer: Self-pay

## 2024-09-03 NOTE — Progress Notes (Signed)
 Prolia  discontinued as of 12.10.25.

## 2024-09-08 ENCOUNTER — Telehealth (INDEPENDENT_AMBULATORY_CARE_PROVIDER_SITE_OTHER): Admitting: Licensed Clinical Social Worker

## 2024-09-08 DIAGNOSIS — F411 Generalized anxiety disorder: Secondary | ICD-10-CM | POA: Diagnosis not present

## 2024-09-08 DIAGNOSIS — F339 Major depressive disorder, recurrent, unspecified: Secondary | ICD-10-CM | POA: Diagnosis not present

## 2024-09-08 NOTE — Progress Notes (Signed)
 THERAPIST PROGRESS NOTE   Session Date: 09/08/2024  Session Time: 1412 - 1430 Virtual Visit via Video Note  I connected with Marche R Kras on 09/08/2024 at  2:00 PM EST by a video enabled telemedicine application and verified that I am speaking with the correct person using two identifiers.  Location: Patient: Home Provider: Home Office   I discussed the limitations of evaluation and management by telemedicine and the availability of in person appointments. The patient expressed understanding and agreed to proceed.  The patient was advised to call back or seek an in-person evaluation if the symptoms worsen or if the condition fails to improve as anticipated.  I provided 18 minutes of non-face-to-face time during this encounter.  Participation Level: Active  Behavioral Response: CasualAlertEuthymic  Type of Therapy: Individual Therapy  Treatment Goals addressed:   Progressing (2) STG: Sherida will identify cognitive patterns and beliefs that support depression (OP Depression) LTG: Reduce frequency of over thinking and increased worries (Anxiety)  Completed/Met (4) LTG: Reduce frequency, intensity, and duration of depression symptoms so that daily functioning is improved (OP Depression) LTG: Increase coping skills to manage depression and improve ability to perform daily activities (OP Depression) LTG: Not feel hopeless or unworthy of anyone, and unable to do anything for anybody (OP Depression) STG: Improve my sleep habits (OP Depression)  Progress Towards Goals: Progressing  Interventions: CBT, Motivational Interviewing, Solution Focused, and Supportive  Summary: Desera is a 49 y.o. female with psych history of MDD, recurrent and GAD, presenting for follow-up therapy session in efforts to improve management of depressive and anxious symptoms.   Patient actively engaged in session, presenting with a pleasant mood and congruent affect throughout the visit. Patient  reported, Everythings been good, describing a positive Christmas spent with their grandson and noting they did not celebrate New Years Eve in any significant way. 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  Patient reported mood stability overall, acknowledging occasional periods of low or negative mood but identifying personal ability to keep going during those times. Session revisited previously expressed concerns regarding expectations around potential reconciliation with partner, with patient reporting continued time spent together, dating, and having scheduled an initial couples counseling appointment to support navigating relationship challenges and future direction.  The visit ended abruptly when patient received a call from their daughter requesting immediate pickup of grandson who was not feeling well at school. Patient rescheduled for 6 weeks and expressed comfort with this interval given the absence of recent issues.     06/16/2024    2:18 PM 05/27/2024   10:28 AM 04/07/2024    2:18 PM 03/18/2024    2:46 PM  GAD 7 : Generalized Anxiety Score  Nervous, Anxious, on Edge 0 0 0 0  Control/stop worrying 1 1 0 0  Worry too much - different things 0 1 0 0  Trouble relaxing 0 0 0 1  Restless 0 0 0 0  Easily annoyed or irritable 0 0 0 0  Afraid - awful might happen 0 0 0 0  Total GAD 7 Score 1 2 0 1  Anxiety Difficulty Not difficult at all Not difficult at all Not difficult at all Not difficult at all      06/16/2024    2:20 PM 05/27/2024   10:31 AM 04/07/2024    1:54 PM 03/18/2024    2:49 PM  03/04/2024    2:27 PM  Depression screen PHQ 2/9  Decreased Interest 0 0 0 0 0  Down, Depressed, Hopeless 0 0 0 0 0  PHQ - 2 Score 0 0 0 0 0  Altered sleeping 1 0 1 0 1  Tired, decreased energy 0 0 0 0 0   Change in appetite 0 0 0 0 0  Feeling bad or failure about yourself  0 0 0 0 0  Trouble concentrating 1 0 1 1 1   Moving slowly or fidgety/restless 0 0 0 0 0  Suicidal thoughts 0 0 0 0 0  PHQ-9 Score 2  0  2  1  2    Difficult doing work/chores Not difficult at all  Not difficult at all Not difficult at all Not difficult at all     Data saved with a previous flowsheet row definition   Suicidal/Homicidal: Nowithout intent/plan  Therapist Response:  Clinician openly greeted patient upon joining virtual visit, assessing presenting moods and affect, actively engaging in introductory check-in. Utilized open ended questions to elicit pt's recounts of events of the past six weeks. Utilized active listening techniques to support pt's recounts, providing support and validation of shared thoughts, feelings, and perspectives in relation to progressions. Utilized CBT to support identification of thoughts linked to intermittent low mood, MI to reinforce motivation toward employment goals, and supportive/strengths-based reflections to highlight coping abilities and proactive engagement in relationship work. Clinician reassessed severity of presenting sxs, and presence of any safety concerns.   Therapist validated patients efforts and insights, encouraged continued monitoring of mood and follow-through with couples counseling, and affirmed patients motivation in pursuing employment opportunities.  []  Cognitive Challenging [x]  Cognitive Refocusing [x]  Cognitive Reframing  []  Communication Skills []  Compliance Issues []  DBT  []  Exploration of Coping Patterns [x]  Exploration of Emotions [x]  Exploration of Relationship Patterns []  Guided Imagery []  Interactive Feedback []  Interpersonal Resolutions []  Mindfulness Training []  Preventative Services [x]  Psycho-Education []  Relaxation/Deep Breathing []  Review of Treatment Plan/Progress []  Role-Play/Behavioral Rehearsal  [x]  Structured Problem Solving [x]   Supportive Reflection []  Symptom Management []  Other    Patient responded well to interventions. Patient continues to meet criteria for MDD, recurrent, and GAD. Patient will continue to benefit from engagement in outpatient therapy due to being the least restrictive service to meet presenting needs. Patient proves to continue to maintain moderate progress towards identified treatment goals.  Plan: Return again in 6 weeks.  Diagnosis:  Encounter Diagnosis  Name Primary?   Depression, recurrent Yes    Collaboration of Care: Other none necessary at this time.  Patient/Guardian was advised Release of Information must be obtained prior to any record release in order to collaborate their care with an outside provider. Patient/Guardian was advised if they have not already done so to contact the registration department to sign all necessary forms in order for us  to release information regarding their care.   Consent: Patient/Guardian gives verbal consent for treatment and assignment of benefits for services provided during this visit. Patient/Guardian expressed understanding and agreed to proceed.   Lynwood JONETTA Maris, MSW, LCSW 09/08/2024,  2:14 PM

## 2024-09-26 ENCOUNTER — Ambulatory Visit: Payer: 59

## 2024-09-29 ENCOUNTER — Ambulatory Visit

## 2024-09-29 VITALS — Ht 69.0 in | Wt 155.0 lb

## 2024-09-29 DIAGNOSIS — Z0000 Encounter for general adult medical examination without abnormal findings: Secondary | ICD-10-CM | POA: Diagnosis not present

## 2024-10-21 ENCOUNTER — Ambulatory Visit (HOSPITAL_COMMUNITY): Admitting: Licensed Clinical Social Worker

## 2024-12-04 ENCOUNTER — Ambulatory Visit: Admitting: Nurse Practitioner

## 2025-06-22 ENCOUNTER — Ambulatory Visit: Admitting: Dermatology

## 2025-10-05 ENCOUNTER — Ambulatory Visit
